# Patient Record
Sex: Female | Born: 1975 | Race: White | Hispanic: No | Marital: Single | Smoking: Never smoker
Health system: Southern US, Community
[De-identification: ages and names within clinical notes are randomized; demographics above are authoritative.]

## PROBLEM LIST (undated history)

## (undated) DIAGNOSIS — J45909 Unspecified asthma, uncomplicated: Secondary | ICD-10-CM

## (undated) DIAGNOSIS — M5020 Other cervical disc displacement, unspecified cervical region: Secondary | ICD-10-CM

## (undated) DIAGNOSIS — Z91018 Allergy to other foods: Secondary | ICD-10-CM

## (undated) DIAGNOSIS — R0602 Shortness of breath: Secondary | ICD-10-CM

## (undated) DIAGNOSIS — R600 Localized edema: Secondary | ICD-10-CM

## (undated) DIAGNOSIS — M549 Dorsalgia, unspecified: Secondary | ICD-10-CM

## (undated) DIAGNOSIS — G4730 Sleep apnea, unspecified: Secondary | ICD-10-CM

## (undated) DIAGNOSIS — G43009 Migraine without aura, not intractable, without status migrainosus: Secondary | ICD-10-CM

## (undated) DIAGNOSIS — R51 Headache: Secondary | ICD-10-CM

## (undated) DIAGNOSIS — M1990 Unspecified osteoarthritis, unspecified site: Secondary | ICD-10-CM

## (undated) DIAGNOSIS — Z9889 Other specified postprocedural states: Secondary | ICD-10-CM

## (undated) DIAGNOSIS — M722 Plantar fascial fibromatosis: Secondary | ICD-10-CM

## (undated) DIAGNOSIS — E669 Obesity, unspecified: Secondary | ICD-10-CM

## (undated) DIAGNOSIS — M2550 Pain in unspecified joint: Secondary | ICD-10-CM

## (undated) DIAGNOSIS — R112 Nausea with vomiting, unspecified: Secondary | ICD-10-CM

## (undated) DIAGNOSIS — T7840XA Allergy, unspecified, initial encounter: Secondary | ICD-10-CM

## (undated) HISTORY — DX: Shortness of breath: R06.02

## (undated) HISTORY — DX: Other cervical disc displacement, unspecified cervical region: M50.20

## (undated) HISTORY — DX: Migraine without aura, not intractable, without status migrainosus: G43.009

## (undated) HISTORY — DX: Allergy, unspecified, initial encounter: T78.40XA

## (undated) HISTORY — DX: Plantar fascial fibromatosis: M72.2

## (undated) HISTORY — PX: OTHER SURGICAL HISTORY: SHX169

## (undated) HISTORY — DX: Allergy to other foods: Z91.018

## (undated) HISTORY — PX: SHOULDER SURGERY: SHX246

## (undated) HISTORY — DX: Pain in unspecified joint: M25.50

## (undated) HISTORY — PX: TOE SURGERY: SHX1073

## (undated) HISTORY — PX: KNEE ARTHROSCOPY: SHX127

## (undated) HISTORY — DX: Unspecified asthma, uncomplicated: J45.909

## (undated) HISTORY — DX: Unspecified osteoarthritis, unspecified site: M19.90

## (undated) HISTORY — DX: Dorsalgia, unspecified: M54.9

## (undated) HISTORY — DX: Obesity, unspecified: E66.9

## (undated) HISTORY — DX: Localized edema: R60.0

---

## 1997-12-21 ENCOUNTER — Emergency Department (HOSPITAL_COMMUNITY): Admission: EM | Admit: 1997-12-21 | Discharge: 1997-12-21 | Payer: Self-pay | Admitting: Emergency Medicine

## 1997-12-21 ENCOUNTER — Encounter: Payer: Self-pay | Admitting: Emergency Medicine

## 1999-06-22 ENCOUNTER — Other Ambulatory Visit: Admission: RE | Admit: 1999-06-22 | Discharge: 1999-06-22 | Payer: Self-pay | Admitting: Family Medicine

## 2001-03-20 ENCOUNTER — Emergency Department (HOSPITAL_COMMUNITY): Admission: EM | Admit: 2001-03-20 | Discharge: 2001-03-20 | Payer: Self-pay | Admitting: Emergency Medicine

## 2001-03-25 ENCOUNTER — Emergency Department (HOSPITAL_COMMUNITY): Admission: EM | Admit: 2001-03-25 | Discharge: 2001-03-25 | Payer: Self-pay | Admitting: Emergency Medicine

## 2001-03-25 ENCOUNTER — Encounter: Payer: Self-pay | Admitting: Emergency Medicine

## 2001-06-25 ENCOUNTER — Other Ambulatory Visit: Admission: RE | Admit: 2001-06-25 | Discharge: 2001-06-25 | Payer: Self-pay | Admitting: Obstetrics and Gynecology

## 2001-07-17 ENCOUNTER — Ambulatory Visit (HOSPITAL_COMMUNITY): Admission: AD | Admit: 2001-07-17 | Discharge: 2001-07-17 | Payer: Self-pay | Admitting: Obstetrics and Gynecology

## 2001-07-17 ENCOUNTER — Encounter (INDEPENDENT_AMBULATORY_CARE_PROVIDER_SITE_OTHER): Payer: Self-pay | Admitting: Specialist

## 2002-02-10 ENCOUNTER — Encounter: Payer: Self-pay | Admitting: Emergency Medicine

## 2002-02-10 ENCOUNTER — Emergency Department (HOSPITAL_COMMUNITY): Admission: EM | Admit: 2002-02-10 | Discharge: 2002-02-10 | Payer: Self-pay | Admitting: Emergency Medicine

## 2002-04-04 HISTORY — PX: TUBAL LIGATION: SHX77

## 2002-06-11 ENCOUNTER — Emergency Department (HOSPITAL_COMMUNITY): Admission: EM | Admit: 2002-06-11 | Discharge: 2002-06-11 | Payer: Self-pay | Admitting: *Deleted

## 2002-06-11 ENCOUNTER — Encounter: Payer: Self-pay | Admitting: *Deleted

## 2002-08-15 ENCOUNTER — Other Ambulatory Visit: Admission: RE | Admit: 2002-08-15 | Discharge: 2002-08-15 | Payer: Self-pay | Admitting: Obstetrics and Gynecology

## 2002-09-26 ENCOUNTER — Ambulatory Visit (HOSPITAL_COMMUNITY): Admission: RE | Admit: 2002-09-26 | Discharge: 2002-09-26 | Payer: Self-pay | Admitting: Obstetrics and Gynecology

## 2002-09-26 ENCOUNTER — Encounter: Payer: Self-pay | Admitting: Obstetrics and Gynecology

## 2002-12-02 ENCOUNTER — Inpatient Hospital Stay (HOSPITAL_COMMUNITY): Admission: AD | Admit: 2002-12-02 | Discharge: 2002-12-02 | Payer: Self-pay | Admitting: Obstetrics and Gynecology

## 2003-02-16 ENCOUNTER — Inpatient Hospital Stay (HOSPITAL_COMMUNITY): Admission: AD | Admit: 2003-02-16 | Discharge: 2003-02-18 | Payer: Self-pay | Admitting: Obstetrics and Gynecology

## 2003-02-17 ENCOUNTER — Encounter (INDEPENDENT_AMBULATORY_CARE_PROVIDER_SITE_OTHER): Payer: Self-pay | Admitting: *Deleted

## 2005-04-08 ENCOUNTER — Emergency Department (HOSPITAL_COMMUNITY): Admission: EM | Admit: 2005-04-08 | Discharge: 2005-04-08 | Payer: Self-pay | Admitting: Family Medicine

## 2005-05-04 ENCOUNTER — Emergency Department (HOSPITAL_COMMUNITY): Admission: EM | Admit: 2005-05-04 | Discharge: 2005-05-04 | Payer: Self-pay | Admitting: Family Medicine

## 2005-05-24 ENCOUNTER — Emergency Department (HOSPITAL_COMMUNITY): Admission: EM | Admit: 2005-05-24 | Discharge: 2005-05-24 | Payer: Self-pay | Admitting: Family Medicine

## 2005-09-20 ENCOUNTER — Emergency Department (HOSPITAL_COMMUNITY): Admission: EM | Admit: 2005-09-20 | Discharge: 2005-09-20 | Payer: Self-pay | Admitting: Family Medicine

## 2005-12-05 ENCOUNTER — Emergency Department (HOSPITAL_COMMUNITY): Admission: EM | Admit: 2005-12-05 | Discharge: 2005-12-05 | Payer: Self-pay | Admitting: Family Medicine

## 2006-06-27 ENCOUNTER — Emergency Department (HOSPITAL_COMMUNITY): Admission: EM | Admit: 2006-06-27 | Discharge: 2006-06-27 | Payer: Self-pay | Admitting: Family Medicine

## 2006-08-18 ENCOUNTER — Emergency Department (HOSPITAL_COMMUNITY): Admission: EM | Admit: 2006-08-18 | Discharge: 2006-08-18 | Payer: Self-pay | Admitting: Emergency Medicine

## 2006-08-18 ENCOUNTER — Ambulatory Visit (HOSPITAL_COMMUNITY): Admission: EM | Admit: 2006-08-18 | Discharge: 2006-08-18 | Payer: Self-pay | Admitting: Emergency Medicine

## 2006-12-14 ENCOUNTER — Emergency Department (HOSPITAL_COMMUNITY): Admission: EM | Admit: 2006-12-14 | Discharge: 2006-12-14 | Payer: Self-pay | Admitting: Emergency Medicine

## 2007-04-06 ENCOUNTER — Encounter: Admission: RE | Admit: 2007-04-06 | Discharge: 2007-04-06 | Payer: Self-pay | Admitting: Orthopedic Surgery

## 2008-01-14 ENCOUNTER — Encounter: Admission: RE | Admit: 2008-01-14 | Discharge: 2008-01-14 | Payer: Self-pay | Admitting: Orthopedic Surgery

## 2009-01-28 ENCOUNTER — Emergency Department (HOSPITAL_COMMUNITY): Admission: EM | Admit: 2009-01-28 | Discharge: 2009-01-28 | Payer: Self-pay | Admitting: Family Medicine

## 2010-07-08 LAB — POCT URINALYSIS DIP (DEVICE)
Hgb urine dipstick: NEGATIVE
Protein, ur: 30 mg/dL — AB
Specific Gravity, Urine: 1.025 (ref 1.005–1.030)
Urobilinogen, UA: 0.2 mg/dL (ref 0.0–1.0)
pH: 6 (ref 5.0–8.0)

## 2010-08-20 NOTE — Discharge Summary (Signed)
[name] [name], [name] [name]                   ACCOUNT NO.:  [id number]   MEDICAL RECORD NO.:  [id number]                   PATIENT TYPE:  INP   LOCATION:  9131                                 FACILITY:  [facility]   PHYSICIAN:  Zenaida Niece, M.D.             DATE OF BIRTH:  Aug 11, 1975   DATE OF ADMISSION:  02/16/2003  DATE OF DISCHARGE:  02/18/2003                                 DISCHARGE SUMMARY   ADMITTING DIAGNOSES:  1. Intrauterine pregnancy at 38 weeks.  2. Group B streptococcus carrier.  3. Desires surgical sterility.   DISCHARGE DIAGNOSES:  1. Intrauterine pregnancy at 38 weeks.  2. Group B streptococcus carrier.  3. Desires surgical sterility.   PROCEDURES:  On February 16, 2003 she had a spontaneous vaginal delivery and  on February 17, 2003 she had a postpartum tubal ligation.   HISTORY AND PHYSICAL:  Please see chart for full history and physical.  Briefly this is a 36 year old white female gravida 5, para 1-0-3-1 with an  EGA of 38-plus weeks who presented with a complaint of regular contractions  with some change on evaluation in triage.  Prenatal labs significant for a  blood type of A negative with a negative antibody screen, rubella immune, 1-  hour glucola was 103, and group B strep was positive.  Past history  significant for one elective termination, one spontaneous abortion, one  fetal demise at 14 weeks, and one vaginal delivery at term 6 pounds 13  ounces.  Allergies to PENICILLIN.  Physical examination:  She is a afebrile  with stable vital signs.  Weight is approximately 247 pounds.  Fetal heart  tracing reactive.  Abdomen obese, gravid, estimated fetal weight 7 pounds.  Vaginal exam on my first exam she was 6, 90, and 0 with vertex presentation  and amniotomy revealed clear fluid.   HOSPITAL COURSE:  Patient was admitted and continued to contract on her own.  On my first exam I performed amniotomy for augmentation.  She was put on  vancomycin for group  B strep prophylaxis due to a PENICILLIN allergy and the  fact that sensitivities were not done on her positive group B strep.  She  progressed to complete, pushed well, and on the afternoon of February 16, 2003 had a vaginal delivery of a viable female infant with Apgars of 8 and 9  that weighed 7 pounds 6 ounces.  Placenta delivered spontaneous and was  intact.  She had a small first degree laceration which was hemostatic and  not repaired.  Estimated blood loss 450 mL.  Postpartum she did well without  complications.  Her delivery hemoglobin 12.4, post delivery 11.1.  On  postpartum day #1 she underwent tubal ligation under her epidural anesthetic  without complications.  On postpartum day #2 she was stable for discharge  home.   DISCHARGE INSTRUCTIONS:  1. Regular diet.  2. Pelvic rest.  3. Follow up in 6 weeks.  4.  Medications are Percocet (#20) one to two p.o. q.4-6h. p.r.n. pain and     over-the-counter ibuprofen as needed.  5. She is given our discharge pamphlet.                                               Zenaida Niece, M.D.    TDM/MEDQ  D:  02/18/2003  T:  02/18/2003  Job:  [id number]

## 2010-08-20 NOTE — Op Note (Signed)
   NAME:  [name] [name], [name] [name]                   ACCOUNT NO.:  [id number]   MEDICAL RECORD NO.:  [id number]                   PATIENT TYPE:  INP   LOCATION:  9131                                 FACILITY:  [facility]   PHYSICIAN:  Zenaida Niece, M.D.             DATE OF BIRTH:  [birth year]   DATE OF PROCEDURE:  02/17/2003  DATE OF DISCHARGE:                                 OPERATIVE REPORT   PREOPERATIVE DIAGNOSES:  1. Desires surgical sterility.  2. Postpartum.   POSTOPERATIVE DIAGNOSES:  1. Desires surgical sterility.  2. Postpartum.   PROCEDURE:  Bilateral partial salpingectomy.   SURGEON:  Zenaida Niece, M.D.   ANESTHESIA:  Epidural.   ESTIMATED BLOOD LOSS:  Less than 50 mL.   FINDINGS:  The patient had normal anatomy.   PROCEDURE IN DETAIL:  The patient was taken to the operating room and placed  in the dorsal supine position.  Her previously-placed epidural was dosed  appropriately.  Abdomen was prepped and draped in the usual sterile fashion.  The level of her anesthesia was found to be adequate and her infraumbilical  skin was infiltrated with 0.25% Marcaine.  A 3 cm horizontal incision was  made and carried down to the fascia.  The fascia was incised sharply and  peritoneal cavity entered bluntly.  Both fallopian tubes were identified and  traced to their fimbriated ends.  A knuckle of tube on each side was ligated  with 0 plain gut suture.  The knuckle of tube was then removed sharply.  On  both sides, both ostia were identified and the stumps were hemostatic.  Fascia was closed with running 0 Vicryl with a reinforcing figure-of-eight  suture of 0 Vicryl.  The skin was closed with a running subcuticular suture  of 4-0 Vicryl followed by a Band-Aid.  The patient tolerated the procedure  well and was taken to the recovery room in stable condition.  Counts were  correct, and she received no antibiotics.                                               Zenaida Niece, M.D.    TDM/MEDQ  D:  02/17/2003  T:  02/17/2003  Job:  [id number]

## 2010-08-20 NOTE — Op Note (Signed)
[facility]  Patient:    [name] [name], [name] [name] Visit Number: [id number] MRN: [id number]          Service Type: OBS Location: [facility] Attending Physician:  Oliver Pila Dictated by:   Alvino Chapel, M.D. Proc. Date: 07/18/01 Admit Date:  07/17/2001                             Operative Report  PREOPERATIVE DIAGNOSIS:       Inevitable abortion at 14-5/7 weeks with                               ruptured membranes.  POSTOPERATIVE DIAGNOSIS:      Inevitable abortion at 14-5/7 weeks with                               ruptured membranes.  OPERATION:                    Dilation and evacuation.  SURGEON:                      Alvino Chapel, M.D.  ANESTHESIA:                   Spinal.  FLUIDS:                       Approximately 1500 cc LR.  URINE OUTPUT:                 Straight catheterization, about 50 cc prior to                               procedure.  ESTIMATED BLOOD LOSS:         550 cc.  INDICATIONS:                  The patient is a 35 year old G4, P1-0-2-1, who was admitted at 14+ weeks with a complaint of rupture of membranes approximately two hours prior to admission followed by bleeding and cramping. Prenatal care up until this point had been uneventful.  On admission the patient was found to have cervix approximately 1.5 cm dilated with membranes protruding through the os.  Uterus was about 14 weeks size.  The patient was counseled regarding the inevitable miscarriage and given her bleeding was counseled it would be best to proceed with a dilation and evacuation.  She understood her risks and agreed to proceed with the procedure.  FINDINGS:                     There were fetal parts which were consistent with 14+ weeks gestation.  These were sent to genetics for evaluation given her second miscarriage and the second trimester loss and also to pathology. the uterus was approximately 15 weeks prior to procedure and  approximately 12 weeks post procedure.  DESCRIPTION OF PROCEDURE:     The patient was taken to the operating room, where spinal anesthesia was obtained.  She was then prepped and draped in the dorsal lithotomy position in the normal sterile fashion.  A speculum was placed in the vagina and the anterior lip of the cervix grasped with a ring forceps and a  size 14 suction curette easily passed into the uterine cavity. With suction applied in several passes, a large amount of tissue was obtained. Intermittently there were also removed both fetal extremities, the fetal spinal column, and the fetal head.  These were all noted and accounted for at the end of the procedure.  A large amount of placental tissue was also removed.  Multiple passes of the suction were necessary for a complete evacuation of the uterus.  A sharp curettage and forceps were used in the uterine cavity to ascertain if any further tissue was present.  There were no large pieces of tissue felt with either method, and the last two passes of suction yielded no further tissue.  At the conclusion of the procedure the patient had normal minimal bleeding.  Her uterus was noted to be approximately 12 weeks size, down from 15 weeks size.  However, given the cavity had not completely closed, she was placed on p.o. methergine for approximately 24 hours and given bleeding precautions.  The patient also received Clindamycin x1 dose given the multiple passes of suction curettage and was given RhoGAM in the recovery room given her Rh negative status. Dictated by:   Alvino Chapel, M.D. Attending Physician:  Oliver Pila DD:  07/18/01 TD:  07/18/01 Job: [id number]

## 2011-04-05 HISTORY — PX: FOOT NEUROMA SURGERY: SHX646

## 2011-05-14 ENCOUNTER — Ambulatory Visit (INDEPENDENT_AMBULATORY_CARE_PROVIDER_SITE_OTHER): Payer: BC Managed Care – PPO | Admitting: Physician Assistant

## 2011-05-14 VITALS — BP 122/85 | HR 80 | Temp 99.0°F | Resp 18 | Ht 60.25 in | Wt 265.8 lb

## 2011-05-14 DIAGNOSIS — J45909 Unspecified asthma, uncomplicated: Secondary | ICD-10-CM | POA: Insufficient documentation

## 2011-05-14 DIAGNOSIS — J45901 Unspecified asthma with (acute) exacerbation: Secondary | ICD-10-CM

## 2011-05-14 MED ORDER — ALBUTEROL SULFATE (2.5 MG/3ML) 0.083% IN NEBU
2.5000 mg | INHALATION_SOLUTION | Freq: Once | RESPIRATORY_TRACT | Status: AC
  Administered 2011-05-14: 2.5 mg via RESPIRATORY_TRACT

## 2011-05-14 MED ORDER — ALBUTEROL SULFATE HFA 108 (90 BASE) MCG/ACT IN AERS: 2.0000 | INHALATION_SPRAY | Freq: Four times a day (QID) | RESPIRATORY_TRACT | Status: DC | PRN

## 2011-05-14 MED ORDER — MOMETASONE FUROATE 220 MCG/INH IN AEPB: 2.0000 | INHALATION_SPRAY | Freq: Every day | RESPIRATORY_TRACT | Status: DC

## 2011-05-14 MED ORDER — PREDNISONE 10 MG PO TABS: ORAL_TABLET | ORAL | Status: DC

## 2011-05-14 MED ORDER — IPRATROPIUM BROMIDE 0.02 % IN SOLN
0.5000 mg | Freq: Once | RESPIRATORY_TRACT | Status: AC
  Administered 2011-05-14: 0.5 mg via RESPIRATORY_TRACT

## 2011-05-14 NOTE — Progress Notes (Signed)
  Subjective:    Patient ID: [name] [name], female    DOB: June 20, 1975, 36 y.o.   MRN: [id number]  Asthma She complains of chest tightness, cough, difficulty breathing, shortness of breath and wheezing. There is no sputum production. This is a chronic problem. The current episode started in the past 7 days (this episode - for 5 days). The problem occurs constantly. The problem has been gradually worsening. The cough is non-productive. Associated symptoms include chest pain (chest wall - more on the R side). Pertinent negatives include no ear pain or nasal congestion. Her symptoms are aggravated by change in weather. Her symptoms are alleviated by beta-agonist (helps for about 1 hr - using it q4h). Her past medical history is significant for asthma.   Pt started asmanex 5 days ago, she is using her Proair q4h with only relief for 1 hr.  She gets this asthma flair about 2times per year when the weather starts to change.  She only takes her asmanex 2x year when she starts to have problems and typically can get control but this year with the length of time of the fluctuating temperature she cannot get control.     Review of Systems  HENT: Negative for ear pain.   Respiratory: Positive for cough, shortness of breath and wheezing. Negative for sputum production.   Cardiovascular: Positive for chest pain (chest wall - more on the R side).       Objective:   Physical Exam  Constitutional: She is oriented to person, place, and time. She appears well-developed and well-nourished.  HENT:  Head: Normocephalic and atraumatic.  Right Ear: Tympanic membrane, external ear and ear canal normal.  Left Ear: Tympanic membrane, external ear and ear canal normal.  Nose: Nose normal.  Neck: Neck supple.  Pulmonary/Chest: Breath sounds normal.       shallow breaths  Lymphadenopathy:    She has no cervical adenopathy.  Neurological: She is alert and oriented to person, place, and time.  Skin: Skin is warm  and dry.  Psychiatric: She has a normal mood and affect. Her behavior is normal. Judgment and thought content normal.    After neb - pt felt better (20%) - improved ability to take a deep breath - no feels a cough trigger with end inspiration      Assessment & Plan:   1. Asthma  albuterol (PROVENTIL) (2.5 MG/3ML) 0.083% nebulizer solution 2.5 mg, ipratropium (ATROVENT) nebulizer solution 0.5 mg, mometasone (ASMANEX) 220 MCG/INH inhaler, albuterol (PROAIR HFA) 108 (90 BASE) MCG/ACT inhaler, predniSONE (DELTASONE) 10 MG tablet     Asthma flare - D/w pt how to prevent this in the future.  D/w pt she should start her Asmanex in Jan and Aug and continue it until her normal triggers have resolved (probably 3 months each season) - this will allow her to only be on the meds for part of the year but also keep her from having uncontrolled flares.

## 2011-06-29 ENCOUNTER — Ambulatory Visit (INDEPENDENT_AMBULATORY_CARE_PROVIDER_SITE_OTHER): Payer: BC Managed Care – PPO | Admitting: Family Medicine

## 2011-06-29 VITALS — BP 114/79 | HR 78 | Temp 97.9°F | Resp 18 | Ht 60.5 in | Wt 273.4 lb

## 2011-06-29 DIAGNOSIS — N390 Urinary tract infection, site not specified: Secondary | ICD-10-CM

## 2011-06-29 DIAGNOSIS — R300 Dysuria: Secondary | ICD-10-CM

## 2011-06-29 DIAGNOSIS — R350 Frequency of micturition: Secondary | ICD-10-CM

## 2011-06-29 LAB — POCT UA - MICROSCOPIC ONLY
Casts, Ur, LPF, POC: NEGATIVE
Mucus, UA: POSITIVE
Yeast, UA: NEGATIVE

## 2011-06-29 LAB — POCT URINALYSIS DIPSTICK
Bilirubin, UA: NEGATIVE
Ketones, UA: NEGATIVE
Protein, UA: NEGATIVE
Spec Grav, UA: 1.015
pH, UA: 7

## 2011-06-29 MED ORDER — CIPROFLOXACIN HCL 250 MG PO TABS: 250.0000 mg | ORAL_TABLET | Freq: Two times a day (BID) | ORAL | Status: AC

## 2011-06-29 MED ORDER — CIPROFLOXACIN HCL 250 MG PO TABS
500.0000 mg | ORAL_TABLET | Freq: Two times a day (BID) | ORAL | Status: DC
  Administered 2011-06-29: 500 mg via ORAL

## 2011-06-29 MED ORDER — PHENAZOPYRIDINE HCL 200 MG PO TABS: 200.0000 mg | ORAL_TABLET | Freq: Three times a day (TID) | ORAL | Status: AC | PRN

## 2011-06-29 NOTE — Patient Instructions (Signed)

## 2011-06-29 NOTE — Progress Notes (Signed)
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  Much worse today.  Took cranberry pills without much relief.  Last UTI was 1 year ago  O: Results for orders placed in visit on 06/29/11  POCT URINALYSIS DIPSTICK      Component Value Range   Color, UA yellow     Clarity, UA clear     Glucose, UA neg     Bilirubin, UA neg     Ketones, UA neg     Spec Grav, UA 1.015     Blood, UA trace     pH, UA 7.0     Protein, UA neg     Urobilinogen, UA 0.2     Nitrite, UA neg     Leukocytes, UA small (1+)    POCT UA - MICROSCOPIC ONLY      Component Value Range   WBC, Ur, HPF, POC tntc     RBC, urine, microscopic 3-5     Bacteria, U Microscopic 3+     Mucus, UA pos     Epithelial cells, urine per micros 3-5     Crystals, Ur, HPF, POC neg     Casts, Ur, LPF, POC neg     Yeast, UA neg     A: UTI, uncomplicated. P:  Cipro 500 mg bid x 3 days Urine culture

## 2011-07-02 LAB — URINE CULTURE: Colony Count: >=100000

## 2011-12-10 ENCOUNTER — Emergency Department (HOSPITAL_COMMUNITY)
Admission: EM | Admit: 2011-12-10 | Discharge: 2011-12-11 | Disposition: A | Discharge status: Home / Self Care | Payer: BC Managed Care – PPO | Attending: Emergency Medicine | Admitting: Emergency Medicine

## 2011-12-10 ENCOUNTER — Encounter (HOSPITAL_COMMUNITY): Payer: Self-pay | Admitting: Emergency Medicine

## 2011-12-10 DIAGNOSIS — Z881 Allergy status to other antibiotic agents status: Secondary | ICD-10-CM | POA: Insufficient documentation

## 2011-12-10 DIAGNOSIS — Z885 Allergy status to narcotic agent status: Secondary | ICD-10-CM | POA: Insufficient documentation

## 2011-12-10 DIAGNOSIS — L0390 Cellulitis, unspecified: Secondary | ICD-10-CM

## 2011-12-10 DIAGNOSIS — L03119 Cellulitis of unspecified part of limb: Secondary | ICD-10-CM | POA: Insufficient documentation

## 2011-12-10 DIAGNOSIS — L02619 Cutaneous abscess of unspecified foot: Secondary | ICD-10-CM | POA: Insufficient documentation

## 2011-12-10 DIAGNOSIS — Z888 Allergy status to other drugs, medicaments and biological substances status: Secondary | ICD-10-CM | POA: Insufficient documentation

## 2011-12-10 NOTE — ED Notes (Signed)
Pt alert, arrives from home, c/o swelling, itching, pain to right foot, recent sx on august 28th, "Mortens Neuroma", has f/u on Thursday, resp even unlabored, skin pwd

## 2011-12-11 MED ORDER — CLINDAMYCIN HCL 150 MG PO CAPS: 150.0000 mg | ORAL_CAPSULE | Freq: Four times a day (QID) | ORAL | Status: AC

## 2011-12-11 MED ORDER — CLINDAMYCIN PHOSPHATE 600 MG/50ML IV SOLN
600.0000 mg | Freq: Once | INTRAVENOUS | Status: AC
  Administered 2011-12-11: 600 mg via INTRAVENOUS
  Filled 2011-12-11: qty 50

## 2011-12-11 NOTE — ED Notes (Signed)
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

## 2011-12-11 NOTE — ED Provider Notes (Signed)
History     CSN: [id number]  Arrival date & time 12/10/11  2304   First MD Initiated Contact with Patient 12/11/11 0144      Chief Complaint  Patient presents with  . 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   Doing well post op until yesterday developed itching and redness. Swelling tonight, some inc pain. Mod in severity, no drainage from wound. No Cp or SOB. Using a walker, no trouble walking.  History reviewed. No pertinent past medical history.  History reviewed. No pertinent past surgical history.  No family history on file.  History  Substance Use Topics  . Smoking status: Never Smoker   . Smokeless tobacco: Not on file  . Alcohol Use: Not on file    OB History    Grav Para Term Preterm Abortions TAB SAB Ect Mult Living                  Review of Systems  Constitutional: Negative for fever and chills.  HENT: Negative for neck pain and neck stiffness.   Eyes: Negative for pain.  Respiratory: Negative for shortness of breath.   Cardiovascular: Negative for chest pain.  Gastrointestinal: Negative for abdominal pain.  Genitourinary: Negative for dysuria.  Musculoskeletal: Negative for back pain.  Skin: Positive for wound.  Neurological: Negative for headaches.  All other systems reviewed and are negative.    Allergies  Other; Amoxicillin; Codeine; and Tramadol  Home Medications   Current Outpatient Rx  Name Route Sig Dispense Refill  . ACETAMINOPHEN 500 MG PO TABS Oral Take 500-1,000 mg by mouth every 6 (six) hours as needed. For pain    . IBUPROFEN 200 MG PO TABS Oral Take 400 mg by mouth every 6 (six) hours as needed. For pain    . ALBUTEROL SULFATE HFA 108 (90 BASE) MCG/ACT IN AERS Inhalation Inhale 2 puffs into the lungs every 6 (six) hours as needed for wheezing. 1 Inhaler 2    BP 149/86  Pulse 99   Temp 98 F (36.7 C) (Oral)  Resp 16  SpO2 99%  Physical Exam  Constitutional: She is oriented to person, place, and time. She appears well-developed and well-nourished.  HENT:  Head: Normocephalic and atraumatic.  Eyes: Conjunctivae and EOM are normal. Pupils are equal, round, and reactive to light.  Neck: Trachea normal. Neck supple. No thyromegaly present.  Cardiovascular: Normal rate, regular rhythm, S1 normal, S2 normal and normal pulses.     No systolic murmur is present   No diastolic murmur is present  Pulses:      Radial pulses are 2+ on the right side, and 2+ on the left side.  Pulmonary/Chest: Effort normal and breath sounds normal. She has no wheezes. She has no rhonchi. She has no rales. She exhibits no tenderness.  Abdominal: Soft. Normal appearance and bowel sounds are normal. There is no tenderness. There is no CVA tenderness and negative Murphy's sign.  Musculoskeletal:       RLE: erythema, inc warmth and mild tenderness of dorsum of foot, ink outline applied. Distal n/v intact  Neurological: She is alert and oriented to person, place, and time. She has normal strength. No cranial nerve deficit or sensory deficit. GCS eye subscore is 4. GCS verbal subscore is 5. GCS motor subscore is 6.  Skin: Skin is warm and dry. No rash noted. She is not diaphoretic.  Psychiatric: Her  speech is normal.       Cooperative and appropriate    ED Course  Procedures (including critical care time)   Cellulitis R foot   IV ABx.   Plan RX and close f/u Ortho. Strict return precautions and cellulitis precautions verbalized as understood.    MDM   VS and nursing notes reviewed. Old records reviewed. IV medications. PT declines need for any pain meds.         Sunnie Nielsen, MD 12/11/11 [phone]

## 2011-12-17 ENCOUNTER — Other Ambulatory Visit: Payer: Self-pay | Admitting: Family Medicine

## 2012-02-16 ENCOUNTER — Ambulatory Visit (INDEPENDENT_AMBULATORY_CARE_PROVIDER_SITE_OTHER): Payer: BC Managed Care – PPO | Admitting: Family Medicine

## 2012-02-16 VITALS — BP 121/85 | HR 101 | Temp 98.2°F | Resp 16 | Ht 60.38 in | Wt 281.4 lb

## 2012-02-16 DIAGNOSIS — J45909 Unspecified asthma, uncomplicated: Secondary | ICD-10-CM

## 2012-02-16 MED ORDER — MOMETASONE FUROATE 220 MCG/INH IN AEPB: 1.0000 | INHALATION_SPRAY | Freq: Two times a day (BID) | RESPIRATORY_TRACT | Status: DC

## 2012-02-16 MED ORDER — PREDNISONE 20 MG PO TABS: ORAL_TABLET | ORAL | Status: DC

## 2012-02-16 NOTE — Progress Notes (Signed)
[facility] [address] [place] [address] [phone]  Date:  02/16/2012   Name:  [name] [name]   DOB:  [birth year]   MRN:  [id number]  PCP:  No primary provider on file.    Chief Complaint: Asthma   History of Present Illness:  [name] [name] is a 36 y.o. very pleasant female patient who presents with the following:  Here today with an asthma flare.  She has noted increased symptoms for about a week.  She has noted wheezing and chest tightness.  Her albuterol does help temporarily.  No productive cough, but she is having some cough.   No fever, chills or body aches.  No ST or ear ache.    She just used albuterol- no other medications for her asthma.  She typically has symptoms just with change of seasons.    She last used her albuterol around 0900 today.   She is using mucinex DM as well PRN.  She will use asthmanex seasonally as needed - however he is out of this so she has not been able to use it.  Last seen for this problem in February of this year.  At that time she was treated with prednisone and asmanex.  This seems like a typical asthma flare to her  Patient Active Problem List  Diagnosis  . Asthma    Past Medical History  Diagnosis Date  . Allergy   . Asthma     Past Surgical History  Procedure Date  . Knee arthroscopy   . Foot neuroma surgery     History  Substance Use Topics  . Smoking status: Never Smoker   . Smokeless tobacco: Not on file  . Alcohol Use: No    Family History  Problem Relation Age of Onset  . Lung disease Father   . Pulmonary embolism Father     Allergies  Allergen Reactions  . Other Other (See Comments)    Nut allergy  . Amoxicillin Rash  . Codeine Nausea And Vomiting and Rash  . Tramadol Nausea And Vomiting and Rash    Medication list has been reviewed and updated.  Current Outpatient Prescriptions on File Prior to Visit  Medication Sig Dispense Refill  . albuterol (PROAIR  HFA) 108 (90 BASE) MCG/ACT inhaler Inhale 2 puffs into the lungs every 6 (six) hours as needed for wheezing.  1 Inhaler  2  . acetaminophen (TYLENOL) 500 MG tablet Take 500-1,000 mg by mouth every 6 (six) hours as needed. For pain      . ibuprofen (ADVIL,MOTRIN) 200 MG tablet Take 400 mg by mouth every 6 (six) hours as needed. For pain       Current Facility-Administered Medications on File Prior to Visit  Medication Dose Route Frequency Provider Last Rate Last Dose  . ciprofloxacin (CIPRO) tablet 500 mg  500 mg Oral BID Elvina Sidle, MD   500 mg at 06/29/11 1440    Review of Systems:  As per HPI- otherwise negative.   Physical Examination: Filed Vitals:   02/16/12 1426  BP: 121/85  Pulse: 101  Temp: 98.2 F (36.8 C)  Resp: 16   Filed Vitals:   02/16/12 1426  Height: 5' 0.38" (1.534 m)  Weight: 281 lb 6.4 oz (127.642 kg)   Body mass index is 54.27 kg/(m^2). Ideal Body Weight: Weight in (lb) to have BMI = 25: 129.4   GEN: WDWN, NAD, Non-toxic, A & O x 3, obese HEENT: Atraumatic, Normocephalic.  Neck supple. No masses, No LAD. Bilateral TM wnl, oropharynx normal.  PEERL,EOMI.   Ears and Nose: No external deformity. CV: RRR, No M/G/R. No JVD. No thrill. No extra heart sounds. PULM: CTA B, no crackles, rhonchi. No retractions. No resp. distress. No accessory muscle use.  Minimal expiratory wheeze ABD: S, NT, ND, +BS. No rebound. No HSM. EXTR: No c/c/e NEURO Normal gait.  PSYCH: Normally interactive. Conversant. Not depressed or anxious appearing.  Calm demeanor.    Assessment and Plan: 1. Asthma  mometasone (ASMANEX 60 METERED DOSES) 220 MCG/INH inhaler, predniSONE (DELTASONE) 20 MG tablet   Seasonal asthma flare.  She may restart her asmanex and use as needed for the next month or so. Also a short burst of prednisone.  Patient (or parent if minor) instructed to return to clinic or call if not better in 2 day(s).  Continue albuterol as needed  Meds ordered this encounter    Medications  . clobetasol (TEMOVATE) 0.05 % external solution    Sig: Apply 1 application topically 2 (two) times daily.  . mometasone (ASMANEX 60 METERED DOSES) 220 MCG/INH inhaler    Sig: Inhale 1 puff into the lungs 2 (two) times daily. Can also do just one puff a day    Dispense:  1 Inhaler    Refill:  12  . predniSONE (DELTASONE) 20 MG tablet    Sig: Take 2 pills daily for 3 days, then 1 pill daily for 3 days    Dispense:  9 tablet    Refill:  0      COPLAND,JESSICA, MD

## 2012-02-16 NOTE — Patient Instructions (Addendum)
Let me know if you are not better in the next few days.  

## 2012-02-25 ENCOUNTER — Ambulatory Visit (INDEPENDENT_AMBULATORY_CARE_PROVIDER_SITE_OTHER): Payer: BC Managed Care – PPO | Admitting: Family Medicine

## 2012-02-25 ENCOUNTER — Ambulatory Visit: Payer: BC Managed Care – PPO

## 2012-02-25 VITALS — BP 145/100 | HR 88 | Resp 18 | Wt 280.0 lb

## 2012-02-25 DIAGNOSIS — J45909 Unspecified asthma, uncomplicated: Secondary | ICD-10-CM

## 2012-02-25 DIAGNOSIS — R059 Cough, unspecified: Secondary | ICD-10-CM

## 2012-02-25 DIAGNOSIS — R05 Cough: Secondary | ICD-10-CM

## 2012-02-25 DIAGNOSIS — R062 Wheezing: Secondary | ICD-10-CM

## 2012-02-25 DIAGNOSIS — J40 Bronchitis, not specified as acute or chronic: Secondary | ICD-10-CM

## 2012-02-25 LAB — POCT CBC
Granulocyte percent: 65.1 %G (ref 37–80)
MCH, POC: 31.1 pg (ref 27–31.2)
MCV: 102.1 fL — AB (ref 80–97)
MID (cbc): 0.6 (ref 0–0.9)
MPV: 10.6 fL (ref 0–99.8)
POC LYMPH PERCENT: 29.1 %L (ref 10–50)
POC MID %: 5.8 %M (ref 0–12)
Platelet Count, POC: 330 10*3/uL (ref 142–424)
RBC: 4.54 M/uL (ref 4.04–5.48)
RDW, POC: 12.3 %

## 2012-02-25 MED ORDER — IPRATROPIUM BROMIDE 0.02 % IN SOLN
0.5000 mg | Freq: Once | RESPIRATORY_TRACT | Status: AC
  Administered 2012-02-25: 0.5 mg via RESPIRATORY_TRACT

## 2012-02-25 MED ORDER — ALBUTEROL SULFATE (2.5 MG/3ML) 0.083% IN NEBU
2.5000 mg | INHALATION_SOLUTION | Freq: Once | RESPIRATORY_TRACT | Status: AC
  Administered 2012-02-25: 2.5 mg via RESPIRATORY_TRACT

## 2012-02-25 MED ORDER — IPRATROPIUM BROMIDE HFA 17 MCG/ACT IN AERS: 2.0000 | INHALATION_SPRAY | Freq: Four times a day (QID) | RESPIRATORY_TRACT | Status: DC

## 2012-02-25 MED ORDER — AZITHROMYCIN 250 MG PO TABS: ORAL_TABLET | ORAL | Status: DC

## 2012-02-25 MED ORDER — PREDNISONE 20 MG PO TABS: ORAL_TABLET | ORAL | Status: DC

## 2012-02-25 NOTE — Progress Notes (Signed)
Subjective: 36 year old lady who's had asthma for the last 8 years. 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 She was in here last week with a asthma flare, and was treated with a six-day course of steroids. She improved, but yesterday got significantly worse again. She says this is likely she's had bronchitis in the past. She does not know she's not coughing up anything. She has not been febrile. She's been wheezing constantly coughing.  Objective: overweight white female with a constant cough. There is a slight wheeze or cough. Throat was clear. Neck supple without significant nodes. Chest has somewhat diminished sending breath sounds. End expiratory wheeze with forced expiration, but on ordinary respirations there's not much obvious wheezing. Heart regular without murmurs. Peak flow was 340, having been 350 last February  Assessment: Asthma and asthmatic bronchitis  Plan: Check CBC and chest x-ray Give nebulizer treatment and decide treatments  Results for orders placed in visit on 02/25/12  POCT CBC      Component Value Range   WBC 10.7 (*) 4.6 - 10.2 K/uL   Lymph, poc 3.1  0.6 - 3.4   POC LYMPH PERCENT 29.1  10 - 50 %L   MID (cbc) 0.6  0 - 0.9   POC MID % 5.8  0 - 12 %M   POC Granulocyte 7.0 (*) 2 - 6.9   Granulocyte percent 65.1  37 - 80 %G   RBC 4.54  4.04 - 5.48 M/uL   Hemoglobin 14.1  12.2 - 16.2 g/dL   HCT, POC 45.4  09.8 - 47.9 %   MCV 102.1 (*) 80 - 97 fL   MCH, POC 31.1  27 - 31.2 pg   MCHC 30.4 (*) 31.8 - 35.4 g/dL   RDW, POC 11.9     Platelet Count, POC 330  142 - 424 K/uL   MPV 10.6  0 - 99.8 fL   [facility] reading (PRIMARY) by  Dr. Alwyn Ren Normal chest  Plan: Treat with atrovent and steroids and abx (since wbc sl up)  Return 10-14 days for recheck.  Repeat peak flow is 400, which was improved.  Diagnoses: Asthma Asthmatic bronchitis Cough

## 2012-02-25 NOTE — Patient Instructions (Signed)
Take medications as directed.  Return in about 2 weeks for followup check. Sooner if worse.

## 2012-03-03 ENCOUNTER — Ambulatory Visit (INDEPENDENT_AMBULATORY_CARE_PROVIDER_SITE_OTHER): Payer: BC Managed Care – PPO | Admitting: Emergency Medicine

## 2012-03-03 VITALS — BP 127/82 | HR 122 | Temp 98.4°F | Resp 17 | Ht 60.5 in | Wt 277.0 lb

## 2012-03-03 DIAGNOSIS — J209 Acute bronchitis, unspecified: Secondary | ICD-10-CM

## 2012-03-03 DIAGNOSIS — J45901 Unspecified asthma with (acute) exacerbation: Secondary | ICD-10-CM

## 2012-03-03 DIAGNOSIS — J018 Other acute sinusitis: Secondary | ICD-10-CM

## 2012-03-03 MED ORDER — PSEUDOEPHEDRINE-GUAIFENESIN ER 60-600 MG PO TB12: 1.0000 | ORAL_TABLET | Freq: Two times a day (BID) | ORAL | Status: DC

## 2012-03-03 MED ORDER — LEVOFLOXACIN 500 MG PO TABS: 500.0000 mg | ORAL_TABLET | Freq: Every day | ORAL | Status: AC

## 2012-03-03 NOTE — Progress Notes (Signed)
[facility] [address] [place] [address] [phone]  Date:  03/03/2012   Name:  [name] [name]   DOB:  24-Feb-1976   MRN:  [id number]  PCP:  Tally Due, MD    Chief Complaint: Follow-up and Bronchitis   History of Present Illness:  [name] [name] is a 36 y.o. very pleasant female patient who presents with the following:  Seen a week ago with exacerbation asthma and bronchitis.  Treated with zpak and prednisone and atrovent.  Still has a purulent nasal discharge and post nasal drainage.  Cough is worse when she lays down.  Has moderate nasal congestion.  No sore throat, wheezing or shortness of breath.  Has a mucoid cough.  No nausea or vomiting.  No stool change.  Says no improvement in the way she feels overall.  Patient Active Problem List  Diagnosis  . Asthma    Past Medical History  Diagnosis Date  . Allergy   . Asthma     Past Surgical History  Procedure Date  . Knee arthroscopy   . Foot neuroma surgery     History  Substance Use Topics  . Smoking status: Never Smoker   . Smokeless tobacco: Not on file  . Alcohol Use: No    Family History  Problem Relation Age of Onset  . Lung disease Father   . Pulmonary embolism Father     Allergies  Allergen Reactions  . Other Other (See Comments)    Nut allergy  . Amoxicillin Rash  . Codeine Nausea And Vomiting and Rash  . Tramadol Nausea And Vomiting and Rash    Medication list has been reviewed and updated.  Current Outpatient Prescriptions on File Prior to Visit  Medication Sig Dispense Refill  . albuterol (PROAIR HFA) 108 (90 BASE) MCG/ACT inhaler Inhale 2 puffs into the lungs every 6 (six) hours as needed for wheezing.  1 Inhaler  2  . ibuprofen (ADVIL,MOTRIN) 200 MG tablet Take 400 mg by mouth every 6 (six) hours as needed. For pain      . ipratropium (ATROVENT HFA) 17 MCG/ACT inhaler Inhale 2 puffs into the lungs 4 (four) times daily.  1 Inhaler  2  .  mometasone (ASMANEX 60 METERED DOSES) 220 MCG/INH inhaler Inhale 1 puff into the lungs 2 (two) times daily. Can also do just one puff a day  1 Inhaler  12  . predniSONE (DELTASONE) 20 MG tablet Take 2 pills daily for 3 days, then 1 pill daily for 3 days  9 tablet  0  . predniSONE (DELTASONE) 20 MG tablet Take 3 pills daily for 2 days, then 2 pills daily for 3 days, then one pill daily for 3 days then one half pill daily for 4 days  19 tablet  0  . azithromycin (ZITHROMAX) 250 MG tablet Take 2 initiallly, then one daily for 4 days  6 tablet  0    Review of Systems:  As per HPI, otherwise negative.    Physical Examination: Filed Vitals:   03/03/12 1639  BP: 127/82  Pulse: 122  Temp: 98.4 F (36.9 C)  Resp: 17   Filed Vitals:   03/03/12 1639  Height: 5' 0.5" (1.537 m)  Weight: 277 lb (125.646 kg)   Body mass index is 53.21 kg/(m^2). Ideal Body Weight: Weight in (lb) to have BMI = 25: 129.9   GEN:  Morbidly obese, NAD, Non-toxic, A & O x 3.  No rash or  shortness of breath HEENT: Atraumatic, Normocephalic. Neck supple. No masses, No LAD. Ears and Nose: No external deformity. CV: RRR, No M/G/R. No JVD. No thrill. No extra heart sounds. PULM: CTA B, no wheezes, crackles, rhonchi. No retractions. No resp. distress. No accessory muscle use. ABD: S, NT, ND, +BS. No rebound. No HSM. EXTR: No c/c/e NEURO Normal gait.  PSYCH: Normally interactive. Conversant. Not depressed or anxious appearing.  Calm demeanor.    Assessment and Plan: Bronchitis Exacerbation asthma Sinusitis augmentin mucinex d Albuterol MDI  Carmelina Dane, MD

## 2012-03-12 NOTE — Progress Notes (Signed)
Reviewed and agree.

## 2012-04-09 ENCOUNTER — Ambulatory Visit (INDEPENDENT_AMBULATORY_CARE_PROVIDER_SITE_OTHER): Payer: BC Managed Care – PPO | Admitting: *Deleted

## 2012-04-09 DIAGNOSIS — Z23 Encounter for immunization: Secondary | ICD-10-CM

## 2012-07-20 ENCOUNTER — Ambulatory Visit (INDEPENDENT_AMBULATORY_CARE_PROVIDER_SITE_OTHER): Payer: BC Managed Care – PPO | Admitting: Physician Assistant

## 2012-07-20 VITALS — BP 144/86 | HR 101 | Temp 98.2°F | Resp 20 | Ht 60.5 in | Wt 289.0 lb

## 2012-07-20 DIAGNOSIS — N390 Urinary tract infection, site not specified: Secondary | ICD-10-CM

## 2012-07-20 DIAGNOSIS — R300 Dysuria: Secondary | ICD-10-CM

## 2012-07-20 LAB — POCT URINALYSIS DIPSTICK
Blood, UA: NEGATIVE
Glucose, UA: NEGATIVE
Nitrite, UA: NEGATIVE
Spec Grav, UA: >=1.03
Urobilinogen, UA: 0.2

## 2012-07-20 LAB — POCT UA - MICROSCOPIC ONLY
Amorphous: POSITIVE
Crystals, Ur, HPF, POC: NEGATIVE
Yeast, UA: NEGATIVE

## 2012-07-20 MED ORDER — NITROFURANTOIN MONOHYD MACRO 100 MG PO CAPS: 100.0000 mg | ORAL_CAPSULE | Freq: Two times a day (BID) | ORAL | Status: AC

## 2012-07-20 NOTE — Progress Notes (Signed)
   [address], [place] [address]   Phone [phone]  Subjective:    Patient ID: Georgeanne Nim, female    DOB: Jun 13, 1975, 37 y.o.   MRN: [id number]  HPI Pt presents to clinic with 2 day h/o urinary urgency and bladder pressure that is getting worse today.  She is having no vaginal symptoms no change in sexual partner.  She knows that she is dehydrated because she drives a city bus and is not able to go to the bathroom easily so she tries not to drink.  She has off the next 2 days.   Review of Systems  Constitutional: Negative for fever and chills.  Gastrointestinal: Positive for abdominal pain (suprapubic pressure). Negative for nausea.  Genitourinary: Positive for urgency and frequency. Negative for dysuria and hematuria.  Musculoskeletal: Negative for back pain.       Objective:   Physical Exam  Vitals reviewed. Constitutional: She is oriented to person, place, and time. She appears well-developed and well-nourished.  HENT:  Head: Normocephalic and atraumatic.  Right Ear: External ear normal.  Left Ear: External ear normal.  Eyes: Conjunctivae are normal.  Cardiovascular: Normal rate, regular rhythm and normal heart sounds.   Pulmonary/Chest: Effort normal and breath sounds normal.  Abdominal: Soft. Bowel sounds are normal. There is tenderness (suprapubic TTP). There is no CVA tenderness.  Neurological: She is alert and oriented to person, place, and time.  Skin: Skin is warm and dry.  Psychiatric: She has a normal mood and affect. Her behavior is normal. Judgment and thought content normal.   Results for orders placed in visit on 07/20/12  POCT URINALYSIS DIPSTICK      Result Value Range   Color, UA yellow     Clarity, UA cloudy     Glucose, UA neg     Bilirubin, UA large     Ketones, UA 15     Spec Grav, UA >=1.030     Blood, UA neg     pH, UA 6.0     Protein, UA trace     Urobilinogen, UA 0.2     Nitrite, UA neg     Leukocytes, UA Trace    POCT UA -  MICROSCOPIC ONLY      Result Value Range   WBC, Ur, HPF, POC 3-11     RBC, urine, microscopic 0-1     Bacteria, U Microscopic 3+     Mucus, UA large     Epithelial cells, urine per micros 0-4     Crystals, Ur, HPF, POC neg     Casts, Ur, LPF, POC neg     Yeast, UA neg     Amorphous pos            Assessment & Plan:  Dysuria - Plan: POCT urinalysis dipstick, POCT UA - Microscopic Only  UTI (urinary tract infection) - Plan: Urine culture, nitrofurantoin, macrocrystal-monohydrate, (MACROBID) 100 MG capsule.  Pt to push fluids.    She is interested in weight loss but not interested in the surgery - she would like to try the bariatric clinic and wants to make sure it is ok.  Benny Lennert PA-C 07/20/2012 6:54 PM

## 2012-07-22 LAB — URINE CULTURE: Colony Count: 15000

## 2012-09-21 ENCOUNTER — Other Ambulatory Visit: Payer: Self-pay | Admitting: Family Medicine

## 2012-09-21 ENCOUNTER — Ambulatory Visit (INDEPENDENT_AMBULATORY_CARE_PROVIDER_SITE_OTHER): Payer: BC Managed Care – PPO | Admitting: Family Medicine

## 2012-09-21 VITALS — BP 132/88 | HR 88 | Temp 98.1°F | Resp 16 | Ht 59.0 in | Wt 287.0 lb

## 2012-09-21 DIAGNOSIS — J45901 Unspecified asthma with (acute) exacerbation: Secondary | ICD-10-CM

## 2012-09-21 DIAGNOSIS — J4541 Moderate persistent asthma with (acute) exacerbation: Secondary | ICD-10-CM

## 2012-09-21 DIAGNOSIS — Z6841 Body Mass Index (BMI) 40.0 and over, adult: Secondary | ICD-10-CM | POA: Insufficient documentation

## 2012-09-21 LAB — POCT CBC
Granulocyte percent: 63 %G (ref 37–80)
MCV: 101.1 fL — AB (ref 80–97)
MID (cbc): 0.6 (ref 0–0.9)
MPV: 11.6 fL (ref 0–99.8)
POC Granulocyte: 5.3 (ref 2–6.9)
POC LYMPH PERCENT: 29.4 %L (ref 10–50)
POC MID %: 7.6 %M (ref 0–12)
Platelet Count, POC: 238 10*3/uL (ref 142–424)
RBC: 4.23 M/uL (ref 4.04–5.48)
RDW, POC: 12.7 %

## 2012-09-21 MED ORDER — ALBUTEROL SULFATE HFA 108 (90 BASE) MCG/ACT IN AERS: 2.0000 | INHALATION_SPRAY | Freq: Four times a day (QID) | RESPIRATORY_TRACT | Status: DC | PRN

## 2012-09-21 MED ORDER — PREDNISONE 20 MG PO TABS: ORAL_TABLET | ORAL | Status: DC

## 2012-09-21 NOTE — Patient Instructions (Addendum)
Use the steroid as directed.  You can continue to use the atrovent, and use the albuterol as directed.  If you are not better in the next few days please let me know- Sooner if worse.

## 2012-09-21 NOTE — Progress Notes (Signed)
[facility] [address], [place] [address] [phone]  Date:  09/21/2012   Name:  [name] [name]   DOB:  November 24, 1975   MRN:  [id number]  PCP:  Tally Due, MD    Chief Complaint: Laryngitis and Cough   History of Present Illness:  [name] [name] is a 37 y.o. very pleasant female patient who presents with the following:  Yesterday she strated to get a hoarse voice-her voice was worse today and she started to have cough.  She did have some wheezing earlier today when she was exposed to hot temperatures.   She has a history of asthma, and does tend to have sx when exposed to heat.   She uses atrovent nebs, and needs another albuterol inhaler.   She is not taking claritin right now.   This seems like a typical asthma flare to her, except for losing her voice.   No fever.   LMP was the end of May/ early June No distress or severe sx, otherwise she is generally healthy except for obesity  Patient Active Problem List   Diagnosis Date Noted  . Asthma 05/14/2011    Past Medical History  Diagnosis Date  . Allergy   . Asthma     Past Surgical History  Procedure Laterality Date  . Knee arthroscopy    . Foot neuroma surgery    . Tubal ligation      History  Substance Use Topics  . Smoking status: Never Smoker   . Smokeless tobacco: Not on file  . Alcohol Use: No    Family History  Problem Relation Age of Onset  . Lung disease Father   . Pulmonary embolism Father     Allergies  Allergen Reactions  . Other Other (See Comments)    Nut allergy  . Amoxicillin Rash  . Codeine Nausea And Vomiting and Rash  . Tramadol Nausea And Vomiting and Rash    Medication list has been reviewed and updated.  Current Outpatient Prescriptions on File Prior to Visit  Medication Sig Dispense Refill  . loratadine (CLARITIN) 10 MG tablet Take 10 mg by mouth daily.       No current facility-administered medications on file prior to visit.     Review of Systems:  As per HPI- otherwise negative.   Physical Examination: Filed Vitals:   09/21/12 1645  BP: 132/88  Pulse: 88  Temp: 98.1 F (36.7 C)  Resp: 16   Filed Vitals:   09/21/12 1645  Height: 4\' 11"  (1.499 m)  Weight: 287 lb (130.182 kg)   Body mass index is 57.94 kg/(m^2). Ideal Body Weight: Weight in (lb) to have BMI = 25: 123.5  GEN: WDWN, NAD, Non-toxic, A & O x 3, obese HEENT: Atraumatic, Normocephalic. Neck supple. No masses, No LAD.  Bilateral TM wnl, oropharynx normal.  PEERL,EOMI.   Ears and Nose: No external deformity. CV: RRR, No M/G/R. No JVD. No thrill. No extra heart sounds. PULM: CTA B at this time- no wheezes, crackles, rhonchi. No retractions. No resp. distress. No accessory muscle use. ABD: S, NT, ND, +BS. No rebound. No HSM. EXTR: No c/c/e NEURO Normal gait.  PSYCH: Normally interactive. Conversant. Not depressed or anxious appearing.  Calm demeanor.   Results for orders placed in visit on 09/21/12  POCT CBC      Result Value Range   WBC 8.4  4.6 - 10.2 K/uL   Lymph, poc 2.5  0.6 - 3.4  POC LYMPH PERCENT 29.4  10 - 50 %L   MID (cbc) 0.6  0 - 0.9   POC MID % 7.6  0 - 12 %M   POC Granulocyte 5.3  2 - 6.9   Granulocyte percent 63.0  37 - 80 %G   RBC 4.23  4.04 - 5.48 M/uL   Hemoglobin 13.2  12.2 - 16.2 g/dL   HCT, POC 16.1  09.6 - 47.9 %   MCV 101.1 (*) 80 - 97 fL   MCH, POC 31.2  27 - 31.2 pg   MCHC 30.8 (*) 31.8 - 35.4 g/dL   RDW, POC 04.5     Platelet Count, POC 238  142 - 424 K/uL   MPV 11.6  0 - 99.8 fL     Assessment and Plan: Asthma with acute exacerbation, moderate persistent - Plan: predniSONE (DELTASONE) 20 MG tablet, albuterol (PROVENTIL HFA;VENTOLIN HFA) 108 (90 BASE) MCG/ACT inhaler, POCT CBC, Comprehensive metabolic panel  ashtma exacerbation, mild.  Prednisone, albuterol HFA.  She is due labs- check CMP and CBC Continue atrovent prn.   Patient (or parent if minor) instructed to return to clinic or call if not  better in 2 day(s).   Signed Abbe Amsterdam, MD

## 2012-09-22 LAB — COMPREHENSIVE METABOLIC PANEL
ALT: 13 U/L (ref 0–35)
AST: 18 U/L (ref 0–37)
Albumin: 4.1 g/dL (ref 3.5–5.2)
Alkaline Phosphatase: 67 U/L (ref 39–117)
BUN: 11 mg/dL (ref 6–23)
Calcium: 9.5 mg/dL (ref 8.4–10.5)
Chloride: 104 mEq/L (ref 96–112)
Potassium: 3.9 mEq/L (ref 3.5–5.3)
Sodium: 138 mEq/L (ref 135–145)

## 2012-09-23 ENCOUNTER — Encounter: Payer: Self-pay | Admitting: Family Medicine

## 2012-09-25 ENCOUNTER — Encounter: Payer: Self-pay | Admitting: Family Medicine

## 2012-11-23 ENCOUNTER — Ambulatory Visit (INDEPENDENT_AMBULATORY_CARE_PROVIDER_SITE_OTHER): Payer: BC Managed Care – PPO | Admitting: Physician Assistant

## 2012-11-23 VITALS — BP 124/76 | HR 93 | Temp 98.1°F | Resp 19 | Ht 61.0 in | Wt 282.0 lb

## 2012-11-23 DIAGNOSIS — J45901 Unspecified asthma with (acute) exacerbation: Secondary | ICD-10-CM

## 2012-11-23 DIAGNOSIS — J4541 Moderate persistent asthma with (acute) exacerbation: Secondary | ICD-10-CM

## 2012-11-23 MED ORDER — MOMETASONE FUROATE 220 MCG/INH IN AEPB: 1.0000 | INHALATION_SPRAY | Freq: Every day | RESPIRATORY_TRACT | Status: DC

## 2012-11-23 MED ORDER — ALBUTEROL SULFATE (2.5 MG/3ML) 0.083% IN NEBU: 2.5000 mg | INHALATION_SOLUTION | Freq: Once | RESPIRATORY_TRACT | Status: DC

## 2012-11-23 MED ORDER — ALBUTEROL SULFATE HFA 108 (90 BASE) MCG/ACT IN AERS: 2.0000 | INHALATION_SPRAY | Freq: Four times a day (QID) | RESPIRATORY_TRACT | Status: DC | PRN

## 2012-11-23 MED ORDER — PREDNISONE 10 MG PO TABS: ORAL_TABLET | ORAL | Status: DC

## 2012-11-23 MED ORDER — IPRATROPIUM BROMIDE 0.02 % IN SOLN: 0.5000 mg | Freq: Once | RESPIRATORY_TRACT | Status: DC

## 2012-11-23 NOTE — Patient Instructions (Addendum)
Begin taking the prednisone pills today - 6 pills at once today, then 5 pills at once tomorrow morning, then 4 pills at once the next day, then 3 pills, then 2 pills, then 1 pill.  Continue using daily allergy medicine to help with any allergic triggers  Use the asmanex (mometasone) inhaler 1 puff every morning.  If you having to use your albuterol inhaler more than 2 times per week, we may need to go to 2 puffs a day on the asmanex.  Continue using albuterol inhaler as needed for wheezing, shortness of breath.  Please let us know if any symptoms are worsening or not improving

## 2012-11-23 NOTE — Progress Notes (Signed)
  Subjective:    Patient ID: [name] [name], female    DOB: 10/22/1975, 37 y.o.   MRN: [id number]  HPI   [name] [name] is a pleasant 37 yr old female here for "my asthma."  States symptoms started four days ago and have worsened.  Experiencing SOB, "feeling like crap", coughing.  Some wheezing but at the moment.  Has had asthma "for awhile now."  Feels like symptoms fluctuate with the weather.  Does have some seasonal allergies as well, has been using claritin this week for allergy symptoms.  Uses albuterol for asthma symptoms - usually less than 1 time per week, but this week has been using q4-6h.  Also has Asmanex - uses prn, this week has been using BID.  Denies other URI symptoms.  No fever.     Review of Systems  Constitutional: Negative for fever and chills.  HENT: Negative.   Respiratory: Positive for chest tightness, shortness of breath and wheezing.   Cardiovascular: Negative.   Gastrointestinal: Negative.   Musculoskeletal: Negative.   Skin: Negative.   Neurological: Negative.        Objective:   Physical Exam  Vitals reviewed. Constitutional: She is oriented to person, place, and time. She appears well-developed and well-nourished. No distress.  HENT:  Head: Normocephalic and atraumatic.  Right Ear: Tympanic membrane and ear canal normal.  Left Ear: Tympanic membrane and ear canal normal.  Mouth/Throat: Uvula is midline, oropharynx is clear and moist and mucous membranes are normal.  Eyes: Conjunctivae are normal. No scleral icterus.  Neck: Neck supple.  Cardiovascular: Normal rate, regular rhythm and normal heart sounds.   Pulmonary/Chest: Effort normal. No accessory muscle usage. No respiratory distress. She has decreased breath sounds (slightly throughout). She has no wheezes. She has no rhonchi. She has no rales.  Lymphadenopathy:    She has no cervical adenopathy.  Neurological: She is alert and oriented to person, place, and time.  Skin: Skin is warm and dry.   Psychiatric: She has a normal mood and affect. Her behavior is normal.    Albuterol + atrovent neb x 1 - subjective improvement in symptoms, better air movement throughout      Assessment & Plan:  Asthma with acute exacerbation, moderate persistent - Plan: albuterol (PROVENTIL) (2.5 MG/3ML) 0.083% nebulizer solution 2.5 mg, ipratropium (ATROVENT) nebulizer solution 0.5 mg, albuterol (PROVENTIL HFA;VENTOLIN HFA) 108 (90 BASE) MCG/ACT inhaler, mometasone (ASMANEX) 220 MCG/INH inhaler, predniSONE (DELTASONE) 10 MG tablet    [name] [name] is a 37 yr old female here with acute exacerbation of asthma.  Neb treatment x 1 in clinic with improvement in symptoms and air movement.  Will start short PO steroid taper today.  Resume daily Asmanex - will start with 1 puff QD, though may need to increase to 2 puffs daily.  Discussed importance of daily inhaled steroid for symptoms control/prevention of flares.  Albuterol prn - discussed with pt that if she is needing albuterol more than 2x per week, we may need to step up therapy.  Discussed also possibly adding Singulair for increased allergy/asthma control.  If any symptoms worsening or not improving, pt to RTC.

## 2012-11-26 ENCOUNTER — Ambulatory Visit (INDEPENDENT_AMBULATORY_CARE_PROVIDER_SITE_OTHER): Payer: BC Managed Care – PPO | Admitting: Family Medicine

## 2012-11-26 VITALS — BP 130/90 | HR 100 | Temp 98.0°F | Resp 18 | Ht 59.0 in | Wt 283.6 lb

## 2012-11-26 DIAGNOSIS — J45901 Unspecified asthma with (acute) exacerbation: Secondary | ICD-10-CM

## 2012-11-26 DIAGNOSIS — J4541 Moderate persistent asthma with (acute) exacerbation: Secondary | ICD-10-CM

## 2012-11-26 MED ORDER — BENZONATATE 200 MG PO CAPS: 200.0000 mg | ORAL_CAPSULE | Freq: Three times a day (TID) | ORAL | Status: DC | PRN

## 2012-11-26 MED ORDER — AZITHROMYCIN 250 MG PO TABS: ORAL_TABLET | ORAL | Status: DC

## 2012-11-26 MED ORDER — PROMETHAZINE-DM 6.25-15 MG/5ML PO SYRP: 2.5000 mL | ORAL_SOLUTION | Freq: Four times a day (QID) | ORAL | Status: DC | PRN

## 2012-11-26 MED ORDER — MOMETASONE FUROATE 220 MCG/INH IN AEPB: 1.0000 | INHALATION_SPRAY | Freq: Two times a day (BID) | RESPIRATORY_TRACT | Status: DC

## 2012-11-26 NOTE — Patient Instructions (Addendum)
Asthma, Acute Bronchospasm Your exam shows you have asthma, or acute bronchospasm that acts like asthma. Bronchospasm means your air passages become narrowed. These conditions are due to inflammation and airway spasm that cause narrowing of the bronchial tubes in the lungs. This causes you to have wheezing and shortness of breath. POSSIBLE TRIGGERS  Animal dander from the skin, hair, or feathers of animals.  Dust mites contained in house dust.  Cockroaches.  Pollen from trees or grass.  Mold.  Cigarette or tobacco smoke.  Air pollutants such as dust, household cleaners, hair sprays, aerosol sprays, paint fumes, strong chemicals, or strong odors.  Cold air or weather changes. Cold air may cause inflammation. Winds increase molds and pollens in the air.  Strong emotions such as crying or laughing hard.  Stress.  Certain medicines such as aspirin or beta-blockers.  Sulfites in such foods and drinks as dried fruits and wine.  Infections or inflammatory conditions such as a flu, cold, or inflammation of the nasal membranes (rhinitis).  Gastroesophageal reflux disease (GERD). GERD is a condition where stomach acid backs up into your throat (esophagus).  Exercise or strenous activity. TREATMENT  Treatment is aimed at making the narrowed airways larger. Mild asthma or bronchospasm is usually controlled with inhaled medicines. Albuterol is a common medicine that you breathe in to open spastic or narrowed airways. Steroid medicine is also used to reduce the inflammation when an attack is moderate or severe. Antibiotics are only used if a bacterial infection is present.  HOME CARE INSTRUCTIONS   Rest.  Drink plenty of liquids. This helps the mucus to remain thin and easily coughed up. Do not use caffeine or alcohol.  Do not smoke. Avoid being exposed to secondhand smoke.  You play a critical role in keeping yourself in good health. Avoid exposure to things that cause you to wheeze.  Avoid exposure to things that cause you to have breathing problems. Keep your medicines up-to-date and available. Carefully follow your caregiver's treatment plan.  When pollen or pollution is bad, keep windows closed and use an air conditioner or go to places with air conditioning.  Take your medicine exactly as prescribed.  Asthma requires careful medical attention. See your caregiver for follow-up as advised. If you are more than [redacted] weeks pregnant and you were prescribed any new medicines, let your obstetrician know about the visit and how you are doing. Arrange a recheck. SEEK IMMEDIATE MEDICAL CARE IF:   You are getting worse.  You have trouble breathing. If severe, call your local emergency services 911 in [place]  You develop chest pain or discomfort.  You are throwing up or not drinking fluids.  You are coughing up yellow, green, brown, or bloody sputum.  You have a fever or persistent symptoms for more than 2 3 days.  You have a fever and your symptoms suddenly get worse.  You have trouble swallowing. MAKE SURE YOU:   Understand these instructions.  Will watch your condition.  Will get help right away if you are not doing well or get worse. Document Released: 07/06/2006 Document Revised: 03/07/2012 Document Reviewed: 03/05/2007 [employer] Patient Information 2014 [employer].  

## 2012-11-26 NOTE — Progress Notes (Signed)
Subjective:    Patient ID: [name] [name], female    DOB: 04-01-1976, 37 y.o.   MRN: [id number] Chief Complaint  Patient presents with  . Follow-up    follow up asthma from Friday    HPI  Has dry hacking cough that won't go away and chest still tight and feels much worse than prior.    Does not have neb machine at home - uses atrovent inhaler -   Takes 40mg  prednisone tonight  - lots of hot flashes.  Using albuterol inhaler every 6 hours.  Past Medical History  Diagnosis Date  . Allergy   . Asthma     Current Outpatient Prescriptions on File Prior to Visit  Medication Sig Dispense Refill  . albuterol (PROVENTIL HFA;VENTOLIN HFA) 108 (90 BASE) MCG/ACT inhaler Inhale 2 puffs into the lungs every 6 (six) hours as needed for wheezing.  1 Inhaler  6  . ipratropium (ATROVENT) 0.02 % nebulizer solution Take 500 mcg by nebulization 4 (four) times daily.      Marland Kitchen loratadine (CLARITIN) 10 MG tablet Take 10 mg by mouth daily.      . mometasone (ASMANEX) 220 MCG/INH inhaler Inhale 1 puff into the lungs daily.  1 Inhaler  6  . predniSONE (DELTASONE) 10 MG tablet Take 6 tabs at once today, then 5 tabs at once tomorrow, then 4 tabs, then 3, then 2, then 1  21 tablet  0   Current Facility-Administered Medications on File Prior to Visit  Medication Dose Route Frequency Provider Last Rate Last Dose  . albuterol (PROVENTIL) (2.5 MG/3ML) 0.083% nebulizer solution 2.5 mg  2.5 mg Nebulization Once Eleanore E Egan, PA-C      . ipratropium (ATROVENT) nebulizer solution 0.5 mg  0.5 mg Nebulization Once Godfrey Pick, PA-C           Review of Systems  Constitutional: Positive for diaphoresis, activity change and fatigue. Negative for fever and chills.  HENT: Positive for congestion. Negative for postnasal drip, rhinorrhea, sneezing and sore throat.   Respiratory: Positive for cough, chest tightness, shortness of breath and wheezing. Negative for stridor.   Cardiovascular: Negative for chest  pain.  Gastrointestinal: Negative for nausea and vomiting.  Endocrine: Positive for heat intolerance.  Skin: Negative for rash.  Hematological: Negative for adenopathy.  Psychiatric/Behavioral: Positive for sleep disturbance.      BP 130/90  Pulse 100  Temp(Src) 98 F (36.7 C) (Oral)  Resp 18  Ht 4\' 11"  (1.499 m)  Wt 283 lb 9.6 oz (128.64 kg)  BMI 57.25 kg/m2  SpO2 98%  LMP 11/13/2012 Objective:   Physical Exam  Constitutional: She is oriented to person, place, and time. She appears well-developed and well-nourished. No distress.  HENT:  Head: Normocephalic and atraumatic.  Right Ear: External ear normal.  Left Ear: External ear normal.  Nose: Nose normal.  Eyes: Conjunctivae are normal. Right eye exhibits no discharge. Left eye exhibits no discharge. No scleral icterus.  Neck: Neck supple. No thyromegaly present.  Cardiovascular: Normal rate, regular rhythm and normal heart sounds.   Pulmonary/Chest: Effort normal. No accessory muscle usage. No respiratory distress. She has no decreased breath sounds. She has wheezes in the right upper field, the right lower field, the left upper field and the left lower field. She has rhonchi in the right lower field and the left lower field. She has no rales.   Talks in complete sentences and walks through clinic w/o apparent dyspnea.  Musculoskeletal: She exhibits no edema  and no tenderness.  Lymphadenopathy:    She has no cervical adenopathy.  Neurological: She is alert and oriented to person, place, and time.  Skin: Skin is warm and dry. She is not diaphoretic. No erythema.  Psychiatric: She has a normal mood and affect. Her behavior is normal.         peak flow 375; goal 470 Assessment & Plan:   Asthma with acute exacerbation, moderate persistent - Plan:  mometasone (ASMANEX) 220 MCG/INH inhaler  Meds ordered this encounter  Medications  . mometasone (ASMANEX) 220 MCG/INH inhaler    Sig: Inhale 1 puff into the lungs 2 (two)  times daily.    Dispense:  1 Inhaler    Refill:  6    Order Specific Question:  Supervising Provider    Answer:  Ethelda Chick [[id number]]  . promethazine-dextromethorphan (PROMETHAZINE-DM) 6.25-15 MG/5ML syrup    Sig: Take 2.5 mLs by mouth 4 (four) times daily as needed for cough.    Dispense:  120 mL    Refill:  0  . benzonatate (TESSALON) 200 MG capsule    Sig: Take 1 capsule (200 mg total) by mouth 3 (three) times daily as needed for cough.    Dispense:  30 capsule    Refill:  0  . azithromycin (ZITHROMAX) 250 MG tablet    Sig: Take 2 tabs PO x 1 dose, then 1 tab PO QD x 4 days    Dispense:  6 tablet    Refill:  0   Norberto Sorenson, MD MPH

## 2013-01-07 ENCOUNTER — Ambulatory Visit (INDEPENDENT_AMBULATORY_CARE_PROVIDER_SITE_OTHER): Payer: BC Managed Care – PPO | Admitting: Family Medicine

## 2013-01-07 VITALS — BP 122/78 | HR 92 | Temp 98.0°F | Resp 16 | Ht 61.0 in | Wt 285.0 lb

## 2013-01-07 DIAGNOSIS — J45901 Unspecified asthma with (acute) exacerbation: Secondary | ICD-10-CM

## 2013-01-07 DIAGNOSIS — R059 Cough, unspecified: Secondary | ICD-10-CM

## 2013-01-07 DIAGNOSIS — R05 Cough: Secondary | ICD-10-CM

## 2013-01-07 MED ORDER — PREDNISONE 20 MG PO TABS: ORAL_TABLET | ORAL | Status: DC

## 2013-01-07 NOTE — Progress Notes (Addendum)
HPI Comments: [name] [name] is a 37 y.o. female who presents to the Urgent Care complaining of a cough, dry without flem. She is experiencing SOB, worsened by today's wind. She has used her inhaler today with temporary relief. Previously seen for bronchitis.  She works as a Midwife. She was in a month ago and was given a Z-Pak and some Tessalon Perles which hasn't really helped. Infectious gotten worse. She is coughing to the point of vomiting.  No nausea, shortness of breath, chest pain, hemoptysis. She has no history of fever during this time.   Objective: HEENT is unremarkable patient in no acute distress but  Chest: Faint wheezes Heart: Regular about 100 beats per minute, no murmur Extremities: No edema  Assessment: Asthmatic bronchitis  Plan: Prednisone Asthma with acute exacerbation - Plan: predniSONE (DELTASONE) 20 MG tablet  Signed, Elvina Sidle, MD

## 2013-01-07 NOTE — Patient Instructions (Signed)
Asthma, Adult Asthma is a condition that affects your lungs. It is characterized by swelling and narrowing of your airways as well as increased mucus production. The narrowing comes from swelling and muscle spasms inside the airways. When this happens, breathing can be difficult and you can have coughing, wheezing, and shortness of breath. Knowing more about asthma can help you manage it better. Asthma cannot be cured, but medicines and lifestyle changes can help control it. Asthma can be a minor problem for some people but if it is not controlled it can lead to a life-threatening asthma attack. Asthma can change over time. It is important to work with your caregiver to manage your asthma symptoms. CAUSES The exact cause of asthma is unknown. Asthma is believed to be caused by inherited (genetic) and environmental exposures. Swelling and redness (inflammation) of the airways occurs in asthma. This can be triggered by allergies, viral lung infections, or irritants in the air. Allergic reactions can cause you to wheeze immediately or several hours after an exposure. Asthma triggers are different for each person. It is important to pay attention and know what triggers your asthma.  Common triggers for asthma attacks include:  Animal dander from the skin, hair, or feathers of animals.  Dust mites contained in house dust.  Cockroaches.  Pollen from trees or grass.  Mold.  Cigarette or tobacco smoke. Smoking cannot be allowed in homes of people with asthma. People with asthma should not smoke and should not be around smokers.  Air pollutants such as dust, household cleaners, hair sprays, aerosol sprays, paint fumes, strong chemicals, or strong odors.  Cold air or weather changes. Cold air may cause inflammation. Winds increase molds and pollens in the air. There is not one best climate for people with asthma.  Strong emotions such as crying or laughing hard.  Stress.  Certain medicines such as  aspirin or beta-blockers.  Sulfites in such foods and drinks as dried fruits and wine.  Infections or inflammatory conditions such as the flu, a cold, or an inflammation of the nasal membranes (rhinitis).  Gastroesophageal reflux disease (GERD). GERD is a condition where stomach acid backs up into your throat (esophagus).  Exercise or strenous activity. Proper pre-exercise medicines allow most people to participate in sports. SYMPTOMS  Feeling short of breath.  Chest tightness or pain.  Difficulty sleeping due to coughing, wheezing, or feeling short of breath.  A whistling or wheezing sound with exhalation.  Coughing or wheezing that is worse when you:  Have a virus (such as a cold or the flu).  Are suffering from allergies.  Are exposed to certain fumes or chemicals.  Exercise. Signs that your asthma is probably getting worse include:   More frequent and bothersome asthma signs and symptoms.  Increasing difficulty breathing. This can be measured by a peak flow meter, which is a simple device used to check how well your lungs are working.  An increasingly frequent need to use a quick-relief inhaler. DIAGNOSIS  The diagnosis of asthma is made by review of your medical history, a physical exam, and possibly from other tests. Lung function studies may help with the diagnosis. TREATMENT  Asthma cannot be cured. However, for the majority of adults, asthma can be controlled with treatment. Besides avoidance of triggers of your asthma, medicines are often required. There are 2 classes of medicine used for asthma treatment: controller medicines (reduce inflammation and symptoms) andreliever or rescue medicines (relieve asthma symptoms during acute attacks). You may require daily   medicines to control your asthma. The most effective long-term controller medicines for asthma are inhaled corticosteroids (blocks inflammation). Other long-term control medicines include:  Leukotriene  receptor antagonists (blocks a pathway of inflammation).  Long-acting beta2-agonists (relaxes the muscles of the airways for at least 12 hours) with an inhaled corticosteroid.  Cromolyn sodium or nedocromil (alters certain inflammatory cells' ability to release chemicals that cause inflammation).  Immunomodulators (alters the immune system to prevent asthma symptoms).  Theophylline (relaxes muscles in the airways). You may also require a short-acting beta2-agonist to relieve asthma symptoms during an acute attack. You should understand what to do during an acute attack. Inhaled medicines are effective when used properly. Read the instructions on how to use your medicines correctly and speak to your caregiver if you have questions. Follow up with your caregiver on a regular basis to make sure your asthma is well-controlled. If your asthma is not well-controlled, if you have been hospitalized for asthma, or if multiple medicines or medium to high doses of inhaled corticosteroids are needed to control your asthma, request a referral to an asthma specialist. HOME CARE INSTRUCTIONS   Take medicines as directed by your caregiver.  Control your home environment in the following ways to help prevent asthma attacks:  Change your heating and air conditioning filter at least once a month.  Place a filter or cheesecloth over your heating and air conditioning vents.  Limit the use of fireplaces and wood stoves.  Do not smoke. Do not stay in places where others are smoking.  Get rid of pests (such as roaches and mice) and their droppings.  If you see mold on a plant, throw it away.  Clean your floors and dust every week. Use unscented cleaning products. Use a vacuum cleaner with a HEPA filter if possible. If vacuuming or cleaning triggers your asthma, try to find someone else to do these chores.  Floors in your house should be wood, tile, or vinyl. Carpet can trap dander and dust.  Use  allergy-proof pillows, mattress covers, and box spring covers.  Wash bedsheets and blankets every week in hot water and dry in a dryer.  Use a blanket that is made of polyester or cotton with a tight nap.  Do not use a dust ruffle on your bed.  Clean bathrooms and kitchens with bleach and repaint with mold-resistant paint.  Wash hands frequently.  Talk to your caregiver about an action plan for managing asthma attacks. This includes the use of a peak flow meter which measures the severity of the attack and medicines that can help stop the attack. An action plan can help minimize or stop the attack without having to seek medical care.  Remain calm during an asthma attack.  Always have a plan prepared for seeking medical attention. This should include contacting your caregiver and in the case of a severe attack, calling your local emergency services (911 in [place] SEEK MEDICAL CARE IF:   You have wheezing, shortness of breath, or a cough even if taking medicine to prevent attacks.  You have thickening of sputum.  Your sputum changes from clear or white to yellow, green, gray, or bloody.  You have any problems that may be related to the medicines you are taking (such as a rash, itching, swelling, or trouble breathing).  You are using a reliever medicine more than 2 3 times per week.  Your peak flow is still at 50 79% of personal best after following your action plan for 1   hour. SEEK IMMEDIATE MEDICAL CARE IF:   You are short of breath even at rest.  You get short of breath when doing very little physical activity.  You have difficulty eating, drinking, or talking due to asthma symptoms.  You have chest pain or you feel that your heart is beating fast.  You have a bluish color to your lips or fingernails.  You are lightheaded, dizzy, or faint.  You have a fever or persistent symptoms for more than 2 3 days.  You have a fever and symptoms suddenly get worse.  You seem to be  getting worse and are unresponsive to treatment during an asthma attack.  Your peak flow is less than 50% of personal best. MAKE SURE YOU:   Understand these instructions.  Will watch your condition.  Will get help right away if you are not doing well or get worse. Document Released: 03/21/2005 Document Revised: 03/07/2012 Document Reviewed: 11/07/2007 [employer] Patient Information 2014 [employer].  

## 2013-01-22 ENCOUNTER — Ambulatory Visit: Payer: BC Managed Care – PPO

## 2013-01-22 ENCOUNTER — Ambulatory Visit (INDEPENDENT_AMBULATORY_CARE_PROVIDER_SITE_OTHER): Payer: BC Managed Care – PPO | Admitting: Family Medicine

## 2013-01-22 VITALS — BP 122/74 | HR 99 | Temp 98.4°F | Resp 17 | Ht 62.0 in | Wt 285.0 lb

## 2013-01-22 DIAGNOSIS — R059 Cough, unspecified: Secondary | ICD-10-CM

## 2013-01-22 DIAGNOSIS — J45901 Unspecified asthma with (acute) exacerbation: Secondary | ICD-10-CM

## 2013-01-22 DIAGNOSIS — J309 Allergic rhinitis, unspecified: Secondary | ICD-10-CM

## 2013-01-22 DIAGNOSIS — R05 Cough: Secondary | ICD-10-CM

## 2013-01-22 DIAGNOSIS — J4531 Mild persistent asthma with (acute) exacerbation: Secondary | ICD-10-CM

## 2013-01-22 DIAGNOSIS — R0602 Shortness of breath: Secondary | ICD-10-CM

## 2013-01-22 DIAGNOSIS — J302 Other seasonal allergic rhinitis: Secondary | ICD-10-CM

## 2013-01-22 MED ORDER — MONTELUKAST SODIUM 10 MG PO TABS: 10.0000 mg | ORAL_TABLET | Freq: Every day | ORAL | Status: DC

## 2013-01-22 MED ORDER — ALBUTEROL SULFATE (2.5 MG/3ML) 0.083% IN NEBU: 2.5000 mg | INHALATION_SOLUTION | Freq: Once | RESPIRATORY_TRACT | Status: DC

## 2013-01-22 MED ORDER — ALBUTEROL SULFATE (2.5 MG/3ML) 0.083% IN NEBU: 2.5000 mg | INHALATION_SOLUTION | Freq: Four times a day (QID) | RESPIRATORY_TRACT | Status: DC | PRN

## 2013-01-22 MED ORDER — MOMETASONE FUROATE 50 MCG/ACT NA SUSP: 2.0000 | Freq: Every day | NASAL | Status: DC

## 2013-01-22 NOTE — Patient Instructions (Signed)
Asthma, Acute Bronchospasm Your exam shows you have asthma, or acute bronchospasm that acts like asthma. Bronchospasm means your air passages become narrowed. These conditions are due to inflammation and airway spasm that cause narrowing of the bronchial tubes in the lungs. This causes you to have wheezing and shortness of breath. POSSIBLE TRIGGERS  Animal dander from the skin, hair, or feathers of animals.  Dust mites contained in house dust.  Cockroaches.  Pollen from trees or grass.  Mold.  Cigarette or tobacco smoke.  Air pollutants such as dust, household cleaners, hair sprays, aerosol sprays, paint fumes, strong chemicals, or strong odors.  Cold air or weather changes. Cold air may cause inflammation. Winds increase molds and pollens in the air.  Strong emotions such as crying or laughing hard.  Stress.  Certain medicines such as aspirin or beta-blockers.  Sulfites in such foods and drinks as dried fruits and wine.  Infections or inflammatory conditions such as a flu, cold, or inflammation of the nasal membranes (rhinitis).  Gastroesophageal reflux disease (GERD). GERD is a condition where stomach acid backs up into your throat (esophagus).  Exercise or strenous activity. TREATMENT  Treatment is aimed at making the narrowed airways larger. Mild asthma or bronchospasm is usually controlled with inhaled medicines. Albuterol is a common medicine that you breathe in to open spastic or narrowed airways. Steroid medicine is also used to reduce the inflammation when an attack is moderate or severe. Antibiotics are only used if a bacterial infection is present.  HOME CARE INSTRUCTIONS   Rest.  Drink plenty of liquids. This helps the mucus to remain thin and easily coughed up. Do not use caffeine or alcohol.  Do not smoke. Avoid being exposed to secondhand smoke.  You play a critical role in keeping yourself in good health. Avoid exposure to things that cause you to wheeze.  Avoid exposure to things that cause you to have breathing problems. Keep your medicines up-to-date and available. Carefully follow your caregiver's treatment plan.  When pollen or pollution is bad, keep windows closed and use an air conditioner or go to places with air conditioning.  Take your medicine exactly as prescribed.  Asthma requires careful medical attention. See your caregiver for follow-up as advised. If you are more than [redacted] weeks pregnant and you were prescribed any new medicines, let your obstetrician know about the visit and how you are doing. Arrange a recheck. SEEK IMMEDIATE MEDICAL CARE IF:   You are getting worse.  You have trouble breathing. If severe, call your local emergency services 911 in [place]  You develop chest pain or discomfort.  You are throwing up or not drinking fluids.  You are coughing up yellow, green, brown, or bloody sputum.  You have a fever or persistent symptoms for more than 2 3 days.  You have a fever and your symptoms suddenly get worse.  You have trouble swallowing. MAKE SURE YOU:   Understand these instructions.  Will watch your condition.  Will get help right away if you are not doing well or get worse. Document Released: 07/06/2006 Document Revised: 03/07/2012 Document Reviewed: 03/05/2007 [employer] Patient Information 2014 [employer].  

## 2013-01-22 NOTE — Progress Notes (Signed)
[facility]:  Office Visit  Chief Complaint:  Chief Complaint  Patient presents with  . Cough  . chest tightness    HPI: [name] [name] is a 37 y.o. female who is here for her asthma sxs . She is not feeling well. She is coughing for the last 4 weeks. She was seen here on 8/22 for asthma sxs then again on 8/25 then again on 10/6. She was given nasonex inhaler, neb treatments in office, z pack and also predniosne. The prednisone helped her but now she has sxs again. She also has facial and chest congestion. Not improving. Feels she may have another attack coming and wants to make sure, these atacks are triggered by the cold air. She is a school bus driver.  She has been using her albuterol inhaler more She uses her nasonex inhaler She has a refill for prednisone that Dr Milus Glazier gave her which she has not refilled but will pick up today.  She does not like how the atrovent makes her feel  Past Medical History  Diagnosis Date  . Allergy   . Asthma    Past Surgical History  Procedure Laterality Date  . Knee arthroscopy    . Foot neuroma surgery    . Tubal ligation     History   Social History  . Marital Status: Single    Spouse Name: [name]    Number of Children: [name]  . Years of Education: [name]   Social History Main Topics  . Smoking status: Never Smoker   . Smokeless tobacco: None  . Alcohol Use: No  . Drug Use: No  . Sexual Activity: Yes    Birth Control/ Protection: Other-see comments   Other Topics Concern  . None   Social History Narrative  . None   Family History  Problem Relation Age of Onset  . Lung disease Father   . Pulmonary embolism Father    Allergies  Allergen Reactions  . Other Other (See Comments)    Nut allergy  . Amoxicillin Rash  . Codeine Nausea And Vomiting and Rash  . Tramadol Nausea And Vomiting and Rash   Prior to Admission medications   Medication Sig Start Date End Date Taking? Authorizing Provider   albuterol (PROVENTIL HFA;VENTOLIN HFA) 108 (90 BASE) MCG/ACT inhaler Inhale 2 puffs into the lungs every 6 (six) hours as needed for wheezing. 11/23/12  Yes Eleanore E Egan, PA-C  benzonatate (TESSALON) 200 MG capsule Take 1 capsule (200 mg total) by mouth 3 (three) times daily as needed for cough. 11/26/12  Yes Sherren Mocha, MD  ipratropium (ATROVENT) 0.02 % nebulizer solution Take 500 mcg by nebulization 4 (four) times daily.   Yes Historical Provider, MD  loratadine (CLARITIN) 10 MG tablet Take 10 mg by mouth daily.   Yes Historical Provider, MD  mometasone (ASMANEX) 220 MCG/INH inhaler Inhale 1 puff into the lungs 2 (two) times daily. 11/26/12  Yes Sherren Mocha, MD  predniSONE (DELTASONE) 20 MG tablet 2 daily with food 01/07/13  Yes Elvina Sidle, MD     ROS: The patient denies fevers, chills, night sweats, unintentional weight loss, chest pain, palpitations, nausea, vomiting, abdominal pain, dysuria, hematuria, melena, numbness, weakness, or tingling.   All other systems have been reviewed and were otherwise negative with the exception of those mentioned in the HPI and as above.    PHYSICAL EXAM: Filed Vitals:   01/22/13 1957  BP: 122/74  Pulse: 99  Temp: 98.4  F (36.9 C)  Resp: 17   Filed Vitals:   01/22/13 1957  Height: 5\' 2"  (1.575 m)  Weight: 285 lb (129.275 kg)   Body mass index is 52.11 kg/(m^2).  General: Alert, no acute distress HEENT:  Normocephalic, atraumatic, oropharynx patent. EOMI, PERRLA Cardiovascular:  Regular rate and rhythm, no rubs murmurs or gallops.  No Carotid bruits, radial pulse intact. No pedal edema.  Respiratory: Clear to auscultation bilaterally.  No wheezes, rales, or rhonchi.  No cyanosis, no use of accessory musculature. BS are normal, she just feels tight.  GI: No organomegaly, abdomen is soft and non-tender, positive bowel sounds.  No masses. Skin: No rashes. Neurologic: Facial musculature symmetric. Psychiatric: Patient is appropriate throughout  our interaction. Lymphatic: No cervical lymphadenopathy Musculoskeletal: Gait intact.   LABS: Results for orders placed in visit on 09/21/12  COMPREHENSIVE METABOLIC PANEL      Result Value Range   Sodium 138  135 - 145 mEq/L   Potassium 3.9  3.5 - 5.3 mEq/L   Chloride 104  96 - 112 mEq/L   CO2 23  19 - 32 mEq/L   Glucose, Bld 95  70 - 99 mg/dL   BUN 11  6 - 23 mg/dL   Creat 1.61  0.96 - 0.45 mg/dL   Total Bilirubin 0.3  0.3 - 1.2 mg/dL   Alkaline Phosphatase 67  39 - 117 U/L   AST 18  0 - 37 U/L   ALT 13  0 - 35 U/L   Total Protein 6.9  6.0 - 8.3 g/dL   Albumin 4.1  3.5 - 5.2 g/dL   Calcium 9.5  8.4 - 40.9 mg/dL  POCT CBC      Result Value Range   WBC 8.4  4.6 - 10.2 K/uL   Lymph, poc 2.5  0.6 - 3.4   POC LYMPH PERCENT 29.4  10 - 50 %L   MID (cbc) 0.6  0 - 0.9   POC MID % 7.6  0 - 12 %M   POC Granulocyte 5.3  2 - 6.9   Granulocyte percent 63.0  37 - 80 %G   RBC 4.23  4.04 - 5.48 M/uL   Hemoglobin 13.2  12.2 - 16.2 g/dL   HCT, POC 81.1  91.4 - 47.9 %   MCV 101.1 (*) 80 - 97 fL   MCH, POC 31.2  27 - 31.2 pg   MCHC 30.8 (*) 31.8 - 35.4 g/dL   RDW, POC 78.2     Platelet Count, POC 238  142 - 424 K/uL   MPV 11.6  0 - 99.8 fL     EKG/XRAY:   Primary read interpreted by Dr. Conley Rolls at [facility]. Increase vascular markings No appreciable infiltrate or pneumothrorax   ASSESSMENT/PLAN: Encounter Diagnoses  Name Primary?  . Cough Yes  . SOB (shortness of breath)   . Asthma with acute exacerbation   . Seasonal allergies    Predicted peak flow for sex, age and height should be about 435 Prepeak 380 Postpeak 400 Felt better after albuterol neb treatment, she does not like atrovent.. Lung sounds were normal to begin with but she feels less contricted and there is more air flow when she takes deep breaths on exam Rx Nasonex nasal spray and aslo singulair 10 mg daily Continue with Claritin, Nasonex inhaler, albuterol inhaler prn when out and about, also with prednisone that she  has not filled yet Since she is getting these asthma exacerbations that have been persistent with  weather changes ie cold air, hot air I will go ahead and rx her a nebulizer machine with albuterol neb meds to use at home prn.  Hand written rx and also albuterol neb solution given to patient, she can get it at [facility] or another DME store.  Declines hycodan since codeine makes her sick, refilled tessalon perles and also promethazine DM.  Go to ER prn or F/u prn Gross sideeffects, risk and benefits, and alternatives of medications d/w patient. Patient is aware that all medications have potential sideeffects and we are unable to predict every sideeffect or drug-drug interaction that may occur.  Rockne Coons, DO 01/23/2013 12:16 PM   01/23/2013 @ 12:16 Called patient to see how she is doing. LM.

## 2013-01-23 MED ORDER — BENZONATATE 200 MG PO CAPS: 200.0000 mg | ORAL_CAPSULE | Freq: Three times a day (TID) | ORAL | Status: DC | PRN

## 2013-01-23 MED ORDER — PROMETHAZINE-DM 6.25-15 MG/5ML PO SYRP: 2.5000 mL | ORAL_SOLUTION | Freq: Four times a day (QID) | ORAL | Status: DC | PRN

## 2013-03-05 ENCOUNTER — Encounter: Payer: Self-pay | Admitting: Family Medicine

## 2013-03-10 ENCOUNTER — Ambulatory Visit (INDEPENDENT_AMBULATORY_CARE_PROVIDER_SITE_OTHER): Payer: Federal, State, Local not specified - PPO | Admitting: Family Medicine

## 2013-03-10 VITALS — BP 114/82 | HR 80 | Temp 98.2°F | Resp 16 | Ht 60.25 in | Wt 283.0 lb

## 2013-03-10 DIAGNOSIS — R059 Cough, unspecified: Secondary | ICD-10-CM

## 2013-03-10 DIAGNOSIS — R05 Cough: Secondary | ICD-10-CM

## 2013-03-10 DIAGNOSIS — J45909 Unspecified asthma, uncomplicated: Secondary | ICD-10-CM

## 2013-03-10 MED ORDER — BENZONATATE 200 MG PO CAPS: 200.0000 mg | ORAL_CAPSULE | Freq: Three times a day (TID) | ORAL | Status: DC | PRN

## 2013-03-10 MED ORDER — AZITHROMYCIN 250 MG PO TABS: ORAL_TABLET | ORAL | Status: DC

## 2013-03-10 MED ORDER — PROMETHAZINE-DM 6.25-15 MG/5ML PO SYRP: 2.5000 mL | ORAL_SOLUTION | Freq: Four times a day (QID) | ORAL | Status: DC | PRN

## 2013-03-10 MED ORDER — FLUTICASONE-SALMETEROL 100-50 MCG/DOSE IN AEPB: 1.0000 | INHALATION_SPRAY | Freq: Two times a day (BID) | RESPIRATORY_TRACT | Status: DC

## 2013-03-10 NOTE — Progress Notes (Signed)
Chief Complaint:  Chief Complaint  Patient presents with  . Cough    yellowish x 2-3 wks    HPI: [name] [name] is a 37 y.o. female who is here for  Persistent cough, productive yellow sputum, no fevers/chills.  She also has allergies and asthma and she has had many recent asthmatic exacerbations. She was treated with steroids and numerous nebs treatments and inhalers.The SOB and wheezing has calmed down since the last time I saw her but she continues to have a persistent cough.  She is on singulair, she is on nasonex,  She has an albuterol inhaler.She was taking Asmanex  No fevers, chills, facial pain, ear pain.  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 She is UTD on her vaccines but does not remember last pertussis vaccine  She is planning to go see General Surgery for bariatric surgery , she would like [place] to fill out forms for her but did not bring them.  Past Medical History  Diagnosis Date  . Allergy   . Asthma    Past Surgical History  Procedure Laterality Date  . Knee arthroscopy    . Foot neuroma surgery    . Tubal ligation     History   Social History  . Marital Status: Single    Spouse Name: [name]    Number of Children: [name]  . Years of Education: [name]   Social History Main Topics  . Smoking status: Never Smoker   . Smokeless tobacco: None  . Alcohol Use: No  . Drug Use: No  . Sexual Activity: Yes    Birth Control/ Protection: Other-see comments   Other Topics Concern  . None   Social History Narrative  . None   Family History  Problem Relation Age of Onset  . Lung disease Father   . Pulmonary embolism Father    Allergies  Allergen Reactions  . Other Other (See Comments)    Nut allergy  . Amoxicillin Rash  . Codeine Nausea And Vomiting and Rash  . Tramadol Nausea And Vomiting and Rash   Prior to Admission medications   Medication Sig Start Date End Date Taking? Authorizing Provider  albuterol  (PROVENTIL HFA;VENTOLIN HFA) 108 (90 BASE) MCG/ACT inhaler Inhale 2 puffs into the lungs every 6 (six) hours as needed for wheezing. 11/23/12  Yes Eleanore E Egan, PA-C  albuterol (PROVENTIL) (2.5 MG/3ML) 0.083% nebulizer solution Take 3 mLs (2.5 mg total) by nebulization every 6 (six) hours as needed for wheezing. 01/22/13  Yes Arlene Genova P Zadiel Leyh, DO  benzonatate (TESSALON) 200 MG capsule Take 1 capsule (200 mg total) by mouth 3 (three) times daily as needed for cough. 01/23/13  Yes Gerldine Suleiman P Ibraheem Voris, DO  mometasone (ASMANEX) 220 MCG/INH inhaler Inhale 1 puff into the lungs 2 (two) times daily. 11/26/12  Yes Sherren Mocha, MD  mometasone (NASONEX) 50 MCG/ACT nasal spray Place 2 sprays into the nose daily. 01/22/13  Yes Massiah Longanecker P Cardelia Sassano, DO  montelukast (SINGULAIR) 10 MG tablet Take 1 tablet (10 mg total) by mouth at bedtime. 01/22/13  Yes Terez Montee P Keelin Neville, DO  promethazine-dextromethorphan (PROMETHAZINE-DM) 6.25-15 MG/5ML syrup Take 2.5 mLs by mouth 4 (four) times daily as needed for cough. 01/23/13  Yes Tinzley Dalia P Marialena Wollen, DO  loratadine (CLARITIN) 10 MG tablet Take 10 mg by mouth daily.    Historical Provider, MD  predniSONE (DELTASONE) 20 MG tablet 2 daily with food 01/07/13  Elvina Sidle, MD     ROS: The patient denies fevers, chills, night sweats, unintentional weight loss, chest pain, palpitations, wheezing, dyspnea on exertion, nausea, vomiting, abdominal pain, dysuria, hematuria, melena, numbness, weakness, or tingling.   All other systems have been reviewed and were otherwise negative with the exception of those mentioned in the HPI and as above.    PHYSICAL EXAM: Filed Vitals:   03/10/13 1112  BP: 114/82  Pulse: 80  Temp: 98.2 F (36.8 C)  Resp: 16   Filed Vitals:   03/10/13 1112  Height: 5' 0.25" (1.53 m)  Weight: 283 lb (128.368 kg)   Body mass index is 54.84 kg/(m^2).  General: Alert, no acute distress, morbidly obese female HEENT:  Normocephalic, atraumatic, oropharynx patent. EOMI, PERRLA, TM nl, + PND,  erythematous throat, no sinus pressure Cardiovascular:  Regular rate and rhythm, no rubs murmurs or gallops.  No Carotid bruits, radial pulse intact. No pedal edema.  Respiratory: Clear to auscultation bilaterally.  No wheezes, rales, or rhonchi.  No cyanosis, no use of accessory musculature GI: No organomegaly, abdomen is soft and non-tender, positive bowel sounds.  No masses. Skin: No rashes. Neurologic: Facial musculature symmetric. Psychiatric: Patient is appropriate throughout our interaction. Lymphatic: No cervical lymphadenopathy Musculoskeletal: Gait intact.   LABS: Results for orders placed in visit on 09/21/12  COMPREHENSIVE METABOLIC PANEL      Result Value Range   Sodium 138  135 - 145 mEq/L   Potassium 3.9  3.5 - 5.3 mEq/L   Chloride 104  96 - 112 mEq/L   CO2 23  19 - 32 mEq/L   Glucose, Bld 95  70 - 99 mg/dL   BUN 11  6 - 23 mg/dL   Creat 1.61  0.96 - 0.45 mg/dL   Total Bilirubin 0.3  0.3 - 1.2 mg/dL   Alkaline Phosphatase 67  39 - 117 U/L   AST 18  0 - 37 U/L   ALT 13  0 - 35 U/L   Total Protein 6.9  6.0 - 8.3 g/dL   Albumin 4.1  3.5 - 5.2 g/dL   Calcium 9.5  8.4 - 40.9 mg/dL  POCT CBC      Result Value Range   WBC 8.4  4.6 - 10.2 K/uL   Lymph, poc 2.5  0.6 - 3.4   POC LYMPH PERCENT 29.4  10 - 50 %L   MID (cbc) 0.6  0 - 0.9   POC MID % 7.6  0 - 12 %M   POC Granulocyte 5.3  2 - 6.9   Granulocyte percent 63.0  37 - 80 %G   RBC 4.23  4.04 - 5.48 M/uL   Hemoglobin 13.2  12.2 - 16.2 g/dL   HCT, POC 81.1  91.4 - 47.9 %   MCV 101.1 (*) 80 - 97 fL   MCH, POC 31.2  27 - 31.2 pg   MCHC 30.8 (*) 31.8 - 35.4 g/dL   RDW, POC 78.2     Platelet Count, POC 238  142 - 424 K/uL   MPV 11.6  0 - 99.8 fL     EKG/XRAY:   Primary read interpreted by Dr. Conley Rolls at [facility].   ASSESSMENT/PLAN: Encounter Diagnoses  Name Primary?  . Cough Yes  . Asthma, chronic   . Asthmatic bronchitis    Pre-Peakflow was 380, predicted for her is 375 Rx Promethazine DM cough syrup Rx  Tessalon Perles Rx Advair diskus for asthma, Trial of Advair for asthma since she  is having to use her nebs and inhaler more persistently  Rx Azithromycin for persistent cough most likely related to allergies/asthma/PND but also to cover for Pertussis  She will return with paperwork for bariatric surgery, needs OV for this F/u prn   Gross sideeffects, risk and benefits, and alternatives of medications d/w patient. Patient is aware that all medications have potential sideeffects and we are unable to predict every sideeffect or drug-drug interaction that may occur.  Keymoni Mccaster PHUONG, DO 03/10/2013 1:45 PM

## 2013-03-23 ENCOUNTER — Ambulatory Visit (INDEPENDENT_AMBULATORY_CARE_PROVIDER_SITE_OTHER): Payer: Federal, State, Local not specified - PPO | Admitting: Physician Assistant

## 2013-03-23 VITALS — BP 122/80 | HR 71 | Temp 98.0°F | Resp 18 | Ht 60.5 in | Wt 281.0 lb

## 2013-03-23 DIAGNOSIS — H6690 Otitis media, unspecified, unspecified ear: Secondary | ICD-10-CM

## 2013-03-23 DIAGNOSIS — H9202 Otalgia, left ear: Secondary | ICD-10-CM

## 2013-03-23 DIAGNOSIS — H9209 Otalgia, unspecified ear: Secondary | ICD-10-CM

## 2013-03-23 DIAGNOSIS — H6691 Otitis media, unspecified, right ear: Secondary | ICD-10-CM

## 2013-03-23 MED ORDER — AZITHROMYCIN 500 MG PO TABS: 500.0000 mg | ORAL_TABLET | Freq: Every day | ORAL | Status: DC

## 2013-03-23 NOTE — Progress Notes (Signed)
   Subjective:    Patient ID: [name] [name], female    DOB: [birth year], 37 y.o.   MRN: [id number]  Otalgia  Associated symptoms include a sore throat. Pertinent negatives include no abdominal pain, coughing, headaches or vomiting.   37 year old female presents for evaluation of left ear pain and left sided throat pain x 2 days. Was seen here on 03/10/13 and given inhaler to use and a Zpack to fill only if not improving. She did start this on 03/20/13 and felt like her symptoms were improving until yesterday when her ear and throat started to hurt.  Does have hx of ear infections and so she is worried that is what she has.  All other symptoms improving.  She is on last day of her Zpack.  Denies fever, chills, nausea, vomiting, headache, wheezing, SOB, or abdominal pain.  Patient is otherwise doing well with no other concerns today.       Review of Systems  Constitutional: Negative for fever and chills.  HENT: Positive for ear pain and sore throat. Negative for congestion, postnasal drip and sinus pressure.   Respiratory: Negative for cough, shortness of breath and wheezing.   Gastrointestinal: Negative for nausea, vomiting and abdominal pain.  Neurological: Negative for dizziness and headaches.       Objective:   Physical Exam  Constitutional: She is oriented to person, place, and time. She appears well-developed and well-nourished.  HENT:  Head: Normocephalic and atraumatic.  Right Ear: Hearing, tympanic membrane, external ear and ear canal normal.  Left Ear: Hearing, external ear and ear canal normal. A middle ear effusion (dull) is present.  Eyes: Conjunctivae are normal.  Neck: Normal range of motion. Neck supple.  Cardiovascular: Normal rate, regular rhythm and normal heart sounds.   Pulmonary/Chest: Effort normal and breath sounds normal.  Lymphadenopathy:    She has no cervical adenopathy.  Neurological: She is alert and oriented to person, place, and time.  Psychiatric:  She has a normal mood and affect. Her behavior is normal. Judgment and thought content normal.          Assessment & Plan:  Otitis media, right - Plan: azithromycin (ZITHROMAX) 500 MG tablet  Will extend rx for azithromycin 500 mg daily x 5 days Continue inhalers as prescribed.  Nasonex daily to help with ETD Return if symptoms worsen or fail to improve.

## 2013-04-11 ENCOUNTER — Ambulatory Visit (INDEPENDENT_AMBULATORY_CARE_PROVIDER_SITE_OTHER): Payer: Self-pay | Admitting: Surgery

## 2013-04-17 ENCOUNTER — Telehealth (INDEPENDENT_AMBULATORY_CARE_PROVIDER_SITE_OTHER): Payer: Self-pay

## 2013-04-17 ENCOUNTER — Ambulatory Visit (INDEPENDENT_AMBULATORY_CARE_PROVIDER_SITE_OTHER): Payer: Self-pay | Admitting: Surgery

## 2013-04-17 NOTE — Telephone Encounter (Signed)
Called and spoke to patient regarding rescheduling her appt today with Dr. Hassell Done due to delay in surgery.  Patient new appt date & time is scheduled for 04/26/13 @ 4:30pm w/Dr. Hassell Done.

## 2013-04-19 ENCOUNTER — Other Ambulatory Visit: Payer: Self-pay | Admitting: Family Medicine

## 2013-04-20 ENCOUNTER — Emergency Department (HOSPITAL_COMMUNITY)
Admission: EM | Admit: 2013-04-20 | Discharge: 2013-04-20 | Disposition: A | Discharge status: Home / Self Care | Payer: BC Managed Care – PPO | Source: Home / Self Care | Attending: Emergency Medicine | Admitting: Emergency Medicine

## 2013-04-20 ENCOUNTER — Encounter (HOSPITAL_COMMUNITY): Payer: Self-pay | Admitting: Emergency Medicine

## 2013-04-20 DIAGNOSIS — J069 Acute upper respiratory infection, unspecified: Secondary | ICD-10-CM

## 2013-04-20 MED ORDER — ALBUTEROL SULFATE HFA 108 (90 BASE) MCG/ACT IN AERS: 1.0000 | INHALATION_SPRAY | Freq: Four times a day (QID) | RESPIRATORY_TRACT | Status: DC | PRN

## 2013-04-20 MED ORDER — BENZONATATE 100 MG PO CAPS: 100.0000 mg | ORAL_CAPSULE | Freq: Three times a day (TID) | ORAL | Status: DC | PRN

## 2013-04-20 MED ORDER — IPRATROPIUM BROMIDE 0.06 % NA SOLN: 2.0000 | Freq: Four times a day (QID) | NASAL | Status: DC

## 2013-04-20 NOTE — ED Provider Notes (Signed)
CSN: [id number]     Arrival date & time 04/20/13  2440 History   First MD Initiated Contact with Patient 04/20/13 [phone]     Chief Complaint  Patient presents with  . Asthma  . Nasal Congestion   (Consider location/radiation/quality/duration/timing/severity/associated sxs/prior Treatment) HPI Comments: Non-smoker.  [facility]: [facility] Decided to come here instead because  "they have no parking available whenever I go over there."  Patient is a 38 y.o. female presenting with URI. The history is provided by the patient.  URI Presenting symptoms: congestion, cough and rhinorrhea   Presenting symptoms: no fever and no sore throat   Severity:  Moderate Onset quality:  Gradual Duration:  4 days Timing:  Constant Progression:  Unchanged Chronicity:  New Associated symptoms: wheezing   Associated symptoms: no headaches     Past Medical History  Diagnosis Date  . Allergy   . Asthma    Past Surgical History  Procedure Laterality Date  . Knee arthroscopy    . Foot neuroma surgery    . Tubal ligation     Family History  Problem Relation Age of Onset  . Lung disease Father   . Pulmonary embolism Father    History  Substance Use Topics  . Smoking status: Never Smoker   . Smokeless tobacco: Not on file  . Alcohol Use: No   OB History   Grav Para Term Preterm Abortions TAB SAB Ect Mult Living                 Review of Systems  Constitutional: Negative for fever.  HENT: Positive for congestion and rhinorrhea. Negative for sore throat.   Eyes: Negative.   Respiratory: Positive for cough and wheezing.   Cardiovascular: Negative.   Gastrointestinal: Negative.   Endocrine: Negative for polydipsia, polyphagia and polyuria.  Genitourinary: Negative.   Musculoskeletal: Negative.   Skin: Negative.   Allergic/Immunologic: Negative for immunocompromised state.  Neurological: Negative for dizziness, weakness and headaches.  Hematological: Negative for  adenopathy.    Allergies  Other; Amoxicillin; Codeine; and Tramadol  Home Medications   Current Outpatient Rx  Name  Route  Sig  Dispense  Refill  . albuterol (PROVENTIL HFA;VENTOLIN HFA) 108 (90 BASE) MCG/ACT inhaler   Inhalation   Inhale 2 puffs into the lungs every 6 (six) hours as needed for wheezing.   1 Inhaler   6   . Fluticasone-Salmeterol (ADVAIR DISKUS) 100-50 MCG/DOSE AEPB   Inhalation   Inhale 1 puff into the lungs 2 (two) times daily.   60 each   5   . montelukast (SINGULAIR) 10 MG tablet   Oral   Take 1 tablet (10 mg total) by mouth at bedtime.   30 tablet   5   . azithromycin (ZITHROMAX) 500 MG tablet   Oral   Take 1 tablet (500 mg total) by mouth daily.   5 tablet   0   . benzonatate (TESSALON) 200 MG capsule   Oral   Take 1 capsule (200 mg total) by mouth 3 (three) times daily as needed for cough.   30 capsule   1   . loratadine (CLARITIN) 10 MG tablet   Oral   Take 10 mg by mouth daily.         . mometasone (NASONEX) 50 MCG/ACT nasal spray   Nasal   Place 2 sprays into the nose daily.   17 g   12   . promethazine-dextromethorphan (PROMETHAZINE-DM) 6.25-15 MG/5ML syrup  Oral   Take 2.5 mLs by mouth 4 (four) times daily as needed for cough.   120 mL   0     May make you drowsy, do not operate heavy machinar ...    BP 142/100  Pulse 88  Temp(Src) 97.9 F (36.6 C) (Oral)  Resp 18  SpO2 97%  LMP 03/26/2013 Physical Exam  Nursing note and vitals reviewed. Constitutional: She is oriented to person, place, and time. She appears well-developed and well-nourished. No distress.  +morbidly obese  HENT:  Head: Normocephalic and atraumatic.  Right Ear: Hearing, tympanic membrane, external ear and ear canal normal.  Left Ear: Hearing, tympanic membrane, external ear and ear canal normal.  Nose: Nose normal.  Mouth/Throat: Uvula is midline, oropharynx is clear and moist and mucous membranes are normal.  Eyes: Conjunctivae are normal.  Right eye exhibits no discharge. Left eye exhibits no discharge. No scleral icterus.  Neck: Neck supple.  Cardiovascular: Normal rate, regular rhythm and normal heart sounds.   Pulmonary/Chest: Effort normal and breath sounds normal. No respiratory distress. She has no wheezes.  Abdominal: Soft. Bowel sounds are normal. There is no tenderness.  Musculoskeletal: Normal range of motion.  Lymphadenopathy:    She has no cervical adenopathy.  Neurological: She is alert and oriented to person, place, and time.  Skin: Skin is warm and dry.  Psychiatric: She has a normal mood and affect. Her behavior is normal.    ED Course  Procedures (including critical care time) Labs Review Labs Reviewed - No data to display Imaging Review No results found.  EKG Interpretation    Date/Time:    Ventricular Rate:    PR Interval:    QRS Duration:   QT Interval:    QTC Calculation:   R Axis:     Text Interpretation:              MDM  Atrovent nasal spray for congestion. Tessalon for cough. No clinical evidence of bronchospasm at today's visit, but patient may need albuterol MDI as she recovers from URI and I will provide Rx for albuterol.  Educated about symptomatic care at home and indications for follow up.    [place], [place] 04/20/13 1014

## 2013-04-20 NOTE — ED Provider Notes (Signed)
Medical screening examination/treatment/procedure(s) were performed by non-physician practitioner and as supervising physician I was immediately available for consultation/collaboration.  Philipp Deputy, M.D.  Harden Mo, MD 04/20/13 2212

## 2013-04-20 NOTE — ED Notes (Signed)
C/O cough with asthma flare-up 4 days ago.  Yesterday felt it was getting worse despite using albuterol nebs and inhaler and her normal inhalers.  Yesterday cough was constant, causing coughing fits that induced vomiting.  Feels like she needs to bring up sputum but is unable to even after nebs.  Started taking a left-over Prednisone Rx, Mucinex, Alka Seltzer Cold and another OTC med.

## 2013-04-21 ENCOUNTER — Ambulatory Visit (INDEPENDENT_AMBULATORY_CARE_PROVIDER_SITE_OTHER): Payer: Federal, State, Local not specified - PPO | Admitting: Emergency Medicine

## 2013-04-21 VITALS — BP 126/82 | HR 97 | Temp 98.0°F | Resp 18 | Ht 60.5 in | Wt 280.6 lb

## 2013-04-21 DIAGNOSIS — J111 Influenza due to unidentified influenza virus with other respiratory manifestations: Secondary | ICD-10-CM

## 2013-04-21 MED ORDER — HYDROCOD POLST-CHLORPHEN POLST 10-8 MG/5ML PO LQCR: 5.0000 mL | Freq: Two times a day (BID) | ORAL | Status: DC | PRN

## 2013-04-21 MED ORDER — OSELTAMIVIR PHOSPHATE 75 MG PO CAPS: 75.0000 mg | ORAL_CAPSULE | Freq: Two times a day (BID) | ORAL | Status: DC

## 2013-04-21 NOTE — Patient Instructions (Signed)
Oseltamivir capsules What is this medicine? OSELTAMIVIR (os el TAM i vir) is an antiviral medicine. It is used to prevent and to treat some kinds of influenza or the flu. It will not work for colds or other viral infections. This medicine may be used for other purposes; ask your health care provider or pharmacist if you have questions. COMMON BRAND NAME(S): Tamiflu What should I tell my health care provider before I take this medicine? They need to know if you have any of the following conditions: -heart disease -immune system problems -kidney disease -liver disease -lung disease -an unusual or allergic reaction to oseltamivir, other medicines, foods, dyes, or preservatives -pregnant or trying to get pregnant -breast-feeding How should I use this medicine? Take this medicine by mouth with a glass of water. Follow the directions on the prescription label. Start this medicine at the first sign of flu symptoms. You can take it with or without food. If it upsets your stomach, take it with food. Take your medicine at regular intervals. Do not take your medicine more often than directed. Take all of your medicine as directed even if you think you are better. Do not skip doses or stop your medicine early. Talk to your pediatrician regarding the use of this medicine in children. While this drug may be prescribed for children as young as 14 days for selected conditions, precautions do apply. Overdosage: If you think you have taken too much of this medicine contact a poison control center or emergency room at once. NOTE: This medicine is only for you. Do not share this medicine with others. What if I miss a dose? If you miss a dose, take it as soon as you remember. If it is almost time for your next dose (within 2 hours), take only that dose. Do not take double or extra doses. What may interact with this medicine? Interactions are not expected. This list may not describe all possible interactions. Give  your health care provider a list of all the medicines, herbs, non-prescription drugs, or dietary supplements you use. Also tell them if you smoke, drink alcohol, or use illegal drugs. Some items may interact with your medicine. What should I watch for while using this medicine? Visit your doctor or health care professional for regular check ups. Tell your doctor if your symptoms do not start to get better or if they get worse. If you have the flu, you may be at an increased risk of developing seizures, confusion, or abnormal behavior. This occurs early in the illness, and more frequently in children and teens. These events are not common, but may result in accidental injury to the patient. Families and caregivers of patients should watch for signs of unusual behavior and contact a doctor or health care professional right away if the patient shows signs of unusual behavior. This medicine is not a substitute for the flu shot. Talk to your doctor each year about an annual flu shot. What side effects may I notice from receiving this medicine? Side effects that you should report to your doctor or health care professional as soon as possible: -allergic reactions like skin rash, itching or hives, swelling of the face, lips, or tongue -anxiety, confusion, unusual behavior -breathing problems -hallucination, loss of contact with reality -redness, blistering, peeling or loosening of the skin, including inside the mouth -seizures Side effects that usually do not require medical attention (report to your doctor or health care professional if they continue or are bothersome): -cough -diarrhea -dizziness -  headache -nausea, vomiting -stomach pain This list may not describe all possible side effects. Call your doctor for medical advice about side effects. You may report side effects to FDA at 1-800-FDA-1088. Where should I keep my medicine? Keep out of the reach of children. Store at room temperature between  15 and 30 degrees C (59 and 86 degrees F). Throw away any unused medicine after the expiration date. NOTE: This sheet is a summary. It may not cover all possible information. If you have questions about this medicine, talk to your doctor, pharmacist, or health care provider.  2014, Elsevier/Gold Standard. (2011-03-25 19:43:38)

## 2013-04-21 NOTE — Progress Notes (Signed)
[facility] [address], [place] [address] [phone]  Date:  04/21/2013   Name:  [name] [name]   DOB:  1975/08/05   MRN:  [id number]  PCP:  [name], MD    Chief Complaint: Cough, Nasal Congestion and Chills   History of Present Illness:  [name] [name] is a 38 y.o. very pleasant female patient who presents with the following:  Ill since Friday with nasal congestion and a mucopurulent drainage.  Has a cough that is not productive and associated with wheezing. Not responsive to her MDI or neb machine.  Only used her MDI once today.  Went to to [facility] urgent yesterday and discharged on tessalon with no improvement  Has a fever since last night with chills.  No fever now.  No nausea or vomiting.  No stool change.  Multiple ill contacts.  No improvement with over the counter medications or other home remedies. Denies other complaint or health concern today.   Patient Active Problem List   Diagnosis Date Noted  . Morbid obesity 09/21/2012  . Asthma 05/14/2011    Past Medical History  Diagnosis Date  . Allergy   . Asthma     Past Surgical History  Procedure Laterality Date  . Knee arthroscopy    . Foot neuroma surgery    . Tubal ligation      History  Substance Use Topics  . Smoking status: Never Smoker   . Smokeless tobacco: Not on file  . Alcohol Use: No    Family History  Problem Relation Age of Onset  . Lung disease Father   . Pulmonary embolism Father     Allergies  Allergen Reactions  . Other Other (See Comments)    Nut allergy  . Amoxicillin Rash  . Codeine Nausea And Vomiting and Rash  . Tramadol Nausea And Vomiting and Rash    Medication list has been reviewed and updated.  Current Outpatient Prescriptions on File Prior to Visit  Medication Sig Dispense Refill  . albuterol (PROVENTIL HFA;VENTOLIN HFA) 108 (90 BASE) MCG/ACT inhaler Inhale 2 puffs into the lungs every 6 (six) hours as needed for  wheezing.  1 Inhaler  6  . benzonatate (TESSALON) 100 MG capsule Take 1 capsule (100 mg total) by mouth 3 (three) times daily as needed for cough. Swallow whole  21 capsule  0  . Fluticasone-Salmeterol (ADVAIR DISKUS) 100-50 MCG/DOSE AEPB Inhale 1 puff into the lungs 2 (two) times daily.  60 each  5  . mometasone (NASONEX) 50 MCG/ACT nasal spray Place 2 sprays into the nose daily.  17 g  12  . montelukast (SINGULAIR) 10 MG tablet Take 1 tablet (10 mg total) by mouth at bedtime.  30 tablet  5  . albuterol (PROVENTIL HFA;VENTOLIN HFA) 108 (90 BASE) MCG/ACT inhaler Inhale 1-2 puffs into the lungs every 6 (six) hours as needed for wheezing or shortness of breath.  1 Inhaler  0  . azithromycin (ZITHROMAX) 500 MG tablet Take 1 tablet (500 mg total) by mouth daily.  5 tablet  0  . benzonatate (TESSALON) 200 MG capsule Take 1 capsule (200 mg total) by mouth 3 (three) times daily as needed for cough.  30 capsule  1  . ipratropium (ATROVENT) 0.06 % nasal spray Place 2 sprays into both nostrils 4 (four) times daily.  15 mL  0  . loratadine (CLARITIN) 10 MG tablet Take 10 mg by mouth daily.      Marland Kitchen  promethazine-dextromethorphan (PROMETHAZINE-DM) 6.25-15 MG/5ML syrup Take 2.5 mLs by mouth 4 (four) times daily as needed for cough.  120 mL  0   Current Facility-Administered Medications on File Prior to Visit  Medication Dose Route Frequency Provider Last Rate Last Dose  . albuterol (PROVENTIL) (2.5 MG/3ML) 0.083% nebulizer solution 2.5 mg  2.5 mg Nebulization Once Thao P Le, DO        Review of Systems:  As per HPI, otherwise negative.    Physical Examination: Filed Vitals:   04/21/13 1736  BP: 126/82  Pulse: 97  Temp: 98 F (36.7 C)  Resp: 18   Filed Vitals:   04/21/13 1736  Height: 5' 0.5" (1.537 m)  Weight: 280 lb 9.6 oz (127.279 kg)   Body mass index is 53.88 kg/(m^2). Ideal Body Weight: Weight in (lb) to have BMI = 25: 129.9  GEN: morbid obesity, NAD, Non-toxic, A & O x 3  Persistent non  productive cough HEENT: Atraumatic, Normocephalic. Neck supple. No masses, No LAD. Ears and Nose: No external deformity. CV: RRR, No M/G/R. No JVD. No thrill. No extra heart sounds. PULM: CTA B, no wheezes, crackles, rhonchi. No retractions. No resp. distress. No accessory muscle use. ABD: S, NT, ND, +BS. No rebound. No HSM. EXTR: No c/c/e NEURO Normal gait.  PSYCH: Normally interactive. Conversant. Not depressed or anxious appearing.  Calm demeanor.    Assessment and Plan: Influenza tamiflu Continue MDI and neb tussionex  Signed,  Ellison Carwin, MD

## 2013-04-26 ENCOUNTER — Telehealth (INDEPENDENT_AMBULATORY_CARE_PROVIDER_SITE_OTHER): Payer: Self-pay

## 2013-04-26 ENCOUNTER — Ambulatory Visit (INDEPENDENT_AMBULATORY_CARE_PROVIDER_SITE_OTHER): Payer: BC Managed Care – PPO | Admitting: Surgery

## 2013-04-26 ENCOUNTER — Encounter (INDEPENDENT_AMBULATORY_CARE_PROVIDER_SITE_OTHER): Payer: Self-pay | Admitting: Surgery

## 2013-04-26 DIAGNOSIS — J45909 Unspecified asthma, uncomplicated: Secondary | ICD-10-CM

## 2013-04-26 DIAGNOSIS — Z6841 Body Mass Index (BMI) 40.0 and over, adult: Secondary | ICD-10-CM

## 2013-04-26 DIAGNOSIS — M129 Arthropathy, unspecified: Secondary | ICD-10-CM

## 2013-04-26 NOTE — Patient Instructions (Signed)

## 2013-04-26 NOTE — Telephone Encounter (Signed)
Left message for pt to call.  I want her to come on in and be ready to be seen by 4

## 2013-04-26 NOTE — Progress Notes (Signed)
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  We explored her history and she does have problems with asthma in his recently had to take steroids for this. In addition she has arthritic pains and takes nonsteroidal anti-inflammatories. Those 2 historical facts led [place] to gravitate more toward her having a sleeve gastrectomy.  Her only prior abdominal surgery was a tubal ligation. She's otherwise been healthy.  Past Medical History  Diagnosis Date  . Allergy   . Asthma   . Arthritis     Past Surgical History  Procedure Laterality Date  . Knee arthroscopy    . Foot neuroma surgery    . Tubal ligation      Current Outpatient Prescriptions  Medication Sig Dispense Refill  . albuterol (PROVENTIL HFA;VENTOLIN HFA) 108 (90 BASE) MCG/ACT inhaler Inhale 1-2 puffs into the lungs every 6 (six) hours as needed for wheezing or shortness of breath.  1 Inhaler  0  . Antiseborrheic Products, Misc. (PROMISEB) CREA       . ASMANEX 60 METERED DOSES 220 MCG/INH inhaler       . benzonatate (TESSALON) 100 MG capsule Take 1 capsule (100 mg total) by mouth 3 (three) times daily as needed for cough. Swallow whole  21 capsule  0  . chlorpheniramine-HYDROcodone (TUSSIONEX PENNKINETIC ER) 10-8 MG/5ML LQCR Take 5 mLs by mouth every 12 (twelve) hours as needed.  60 mL  0  . clobetasol (TEMOVATE) 0.05 % external solution       . Fluticasone-Salmeterol (ADVAIR DISKUS) 100-50 MCG/DOSE AEPB Inhale 1 puff into the lungs 2 (two) times daily.  60 each  5  . mometasone (NASONEX) 50 MCG/ACT nasal spray Place 2 sprays into the nose daily.  17 g  12  . montelukast (SINGULAIR) 10 MG tablet Take 1 tablet (10 mg total) by mouth at bedtime.  30 tablet  5   Current Facility-Administered Medications   Medication Dose Route Frequency Provider Last Rate Last Dose  . albuterol (PROVENTIL) (2.5 MG/3ML) 0.083% nebulizer solution 2.5 mg  2.5 mg Nebulization Once Thao P Le, DO       Other; Amoxicillin; Codeine; and Tramadol Family History  Problem Relation Age of Onset  . Lung disease Father   . Pulmonary embolism Father    Social History:   reports that she has never smoked. She has never used smokeless tobacco. She reports that she drinks alcohol. She reports that she does not use illicit drugs.   REVIEW OF SYSTEMS - PERTINENT POSITIVES ONLY: Negative for DVT. She does have asthma which has been getting worse with her weight. She denies reflux.  Physical Exam:   Blood pressure 140/92, pulse 104, temperature 98.2 F (36.8 C), temperature source Temporal, resp. rate 15, height 5' (1.524 m), weight 283 lb (128.368 kg), last menstrual period 03/26/2013. Body mass index is 55.27 kg/(m^2).  Gen:  WDWN white female NAD  Neurological: Alert and oriented to person, place, and time. Motor and sensory function is grossly intact  Head: Normocephalic and atraumatic.  Eyes: Conjunctivae are normal. Pupils are equal, round, and reactive to light. No scleral icterus.  Neck: Normal range of motion. Neck supple. No tracheal deviation or thyromegaly present.  Cardiovascular:  SR without murmurs or gallops.  No carotid bruits  Respiratory: Effort normal.  No respiratory distress. No chest wall tenderness. Breath sounds normal.  No wheezes, rales or rhonchi.  Abdomen:  Obese nontender. GU: Musculoskeletal: Normal range of motion. Extremities are nontender. No cyanosis, edema or clubbing noted Lymphadenopathy: No cervical, preauricular, postauricular or axillary adenopathy is present Skin: Skin is warm and dry. No rash noted. No diaphoresis. No erythema. No pallor. Pscyh: Normal mood and affect. Behavior is normal. Judgment and thought content normal.   LABORATORY RESULTS: No results found for this or any  previous visit (from the past 48 hour(s)).  RADIOLOGY RESULTS: No results found.  Problem List: Patient Active Problem List   Diagnosis Date Noted  . Morbid obesity 09/21/2012  . Asthma 05/14/2011    Assessment & Plan: Morbid obesity BMI 55. Plan will work toward laparoscopic sleeve gastrectomy    [name] Done, MD, [facility] [phone] beeper [phone]  04/26/2013 5:02 PM

## 2013-05-03 LAB — HEMOGLOBIN A1C
HEMOGLOBIN A1C: 5.1 % (ref <5.7)
MEAN PLASMA GLUCOSE: 100 mg/dL (ref <117)

## 2013-05-03 LAB — COMPREHENSIVE METABOLIC PANEL
ALBUMIN: 4.3 g/dL (ref 3.5–5.2)
ALK PHOS: 71 U/L (ref 39–117)
ALT: 16 U/L (ref 0–35)
AST: 18 U/L (ref 0–37)
BUN: 10 mg/dL (ref 6–23)
CHLORIDE: 102 meq/L (ref 96–112)
CO2: 26 mEq/L (ref 19–32)
Calcium: 9.4 mg/dL (ref 8.4–10.5)
Creat: 0.74 mg/dL (ref 0.50–1.10)
Glucose, Bld: 90 mg/dL (ref 70–99)
POTASSIUM: 4 meq/L (ref 3.5–5.3)
Sodium: 137 mEq/L (ref 135–145)
Total Bilirubin: 0.4 mg/dL (ref 0.2–1.2)
Total Protein: 6.9 g/dL (ref 6.0–8.3)

## 2013-05-03 LAB — CBC WITH DIFFERENTIAL/PLATELET
BASOS ABS: 0 10*3/uL (ref 0.0–0.1)
BASOS PCT: 0 % (ref 0–1)
Eosinophils Absolute: 0 10*3/uL (ref 0.0–0.7)
Eosinophils Relative: 0 % (ref 0–5)
HEMATOCRIT: 39.9 % (ref 36.0–46.0)
Hemoglobin: 13.5 g/dL (ref 12.0–15.0)
Lymphocytes Relative: 24 % (ref 12–46)
Lymphs Abs: 2.8 10*3/uL (ref 0.7–4.0)
MCH: 32.1 pg (ref 26.0–34.0)
MCHC: 33.8 g/dL (ref 30.0–36.0)
MCV: 94.8 fL (ref 78.0–100.0)
MONO ABS: 0.7 10*3/uL (ref 0.1–1.0)
Monocytes Relative: 6 % (ref 3–12)
NEUTROS ABS: 8.2 10*3/uL — AB (ref 1.7–7.7)
Neutrophils Relative %: 70 % (ref 43–77)
Platelets: 317 10*3/uL (ref 150–400)
RBC: 4.21 MIL/uL (ref 3.87–5.11)
RDW: 13 % (ref 11.5–15.5)
WBC: 11.8 10*3/uL — ABNORMAL HIGH (ref 4.0–10.5)

## 2013-05-03 LAB — T4: T4, Total: 9.7 ug/dL (ref 5.0–12.5)

## 2013-05-03 LAB — TSH: TSH: 1.405 u[IU]/mL (ref 0.350–4.500)

## 2013-05-07 NOTE — Addendum Note (Signed)
Addended by: Ivor Costa on: 05/07/2013 02:27 PM   Modules accepted: Orders

## 2013-05-08 ENCOUNTER — Encounter: Payer: Self-pay | Admitting: Dietician

## 2013-05-08 ENCOUNTER — Encounter: Payer: BC Managed Care – PPO | Attending: Surgery | Admitting: Dietician

## 2013-05-08 DIAGNOSIS — Z01818 Encounter for other preprocedural examination: Secondary | ICD-10-CM | POA: Insufficient documentation

## 2013-05-08 DIAGNOSIS — Z713 Dietary counseling and surveillance: Secondary | ICD-10-CM | POA: Insufficient documentation

## 2013-05-08 NOTE — Progress Notes (Signed)
  Pre-Op Assessment Visit:  Pre-Operative Gastric Sleeve Surgery  Medical Nutrition Therapy:  Appt start time: 0034   End time:  1130.  Patient was seen on 05/08/2013 for Pre-Operative Gastric Sleeve Nutrition Assessment. Assessment and letter of approval faxed to [facility] Bariatric Surgery Program coordinator on 05/08/2013.   Handouts given during visit include:  Pre-Op Goals Bariatric Surgery Protein Shakes  Patient to call the [facility] to enroll in Pre-Op and Post-Op Nutrition Education when surgery date is scheduled.

## 2013-05-08 NOTE — Patient Instructions (Signed)
Patient to call the [facility] to enroll in Pre-Op and Post-Op Nutrition Education when surgery date is scheduled. 

## 2013-05-12 ENCOUNTER — Ambulatory Visit (INDEPENDENT_AMBULATORY_CARE_PROVIDER_SITE_OTHER): Payer: Federal, State, Local not specified - PPO | Admitting: Family Medicine

## 2013-05-12 VITALS — BP 134/84 | HR 83 | Temp 98.3°F | Resp 18 | Ht 60.0 in | Wt 278.2 lb

## 2013-05-12 DIAGNOSIS — Z0289 Encounter for other administrative examinations: Secondary | ICD-10-CM

## 2013-05-12 NOTE — Progress Notes (Signed)
    Chief Complaint:  Chief Complaint  Patient presents with  . Other    Bariatric sx review paperwork    HPI: [name] [name] is a 37 y.o. female who is here for  Bariatric surgery evaluation.  She is planning to get this done as soon as possible, she has gone to see the nutritionist and now is scheduled to have a psychology evaulation  The surgery will be done by Dr Matthew Martin. She has been on a "gizillion diets", These diest include meals palns, personal trainers, medicines. However all these diets and exercises failed for one reason or another.  1 1/2 year ago she was working out by herself and then her right foot started hurting andshe had to have a nerve  removed and she regained whatever she weight she ahd lost when she was regularly exercising.  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 She ahs also been on Atkins and weight watchers.  She was also given Phentermine but the pills cause her to have the jitters She has done the Slim Fast diet and that also made her jittery  Atkins diet in 2012 di dnot help her lose weight, she stopped because she did not see   Past Medical History  Diagnosis Date  . Allergy   . Asthma   . Arthritis   . Obesity    Past Surgical History  Procedure Laterality Date  . Knee arthroscopy    . Foot neuroma surgery    . Tubal ligation     History   Social History  . Marital Status: Single    Spouse Name: [name]    Number of Children: [name]  . Years of Education: [name]   Social History Main Topics  . Smoking status: Never Smoker   . Smokeless tobacco: Never Used  . Alcohol Use: Yes     Comment: occasionally  . Drug Use: No  . Sexual Activity: None   Other Topics Concern  . None   Social History Narrative  . None     Family History  Problem Relation Age of Onset  . Lung disease Father   . Pulmonary embolism Father   . COPD Other   . Diabetes Other    Allergies  Allergen Reactions  . Other Other (See Comments)    Nut allergy  . Amoxicillin Rash  . Codeine Nausea And Vomiting and Rash  . Tramadol Nausea And Vomiting and Rash   Prior to Admission medications   Medication Sig Start Date End Date Taking? Authorizing Provider  albuterol (PROVENTIL HFA;VENTOLIN HFA) 108 (90 BASE) MCG/ACT inhaler Inhale 1-2 puffs into the lungs every 6 (six) hours as needed for wheezing or shortness of breath. 04/20/13  Yes Jennifer Lee Presson, PA  Antiseborrheic Products, Misc. (PROMISEB) CREA  02/16/13  Yes Historical Provider, MD  ASMANEX 60 METERED DOSES 220 MCG/INH inhaler  01/23/13  Yes Historical Provider, MD  clobetasol (TEMOVATE) 0.05 % external solution  03/20/13  Yes Historical Provider, MD  Fluticasone-Salmeterol (ADVAIR DISKUS) 100-50 MCG/DOSE AEPB Inhale 1 puff into the lungs 2 (two) times daily. 03/10/13  Yes Thao P Le, DO  mometasone (NASONEX) 50 MCG/ACT nasal spray Place 2 sprays into the nose daily. 01/22/13  Yes Thao P Le,   DO  montelukast (SINGULAIR) 10 MG tablet Take 1 tablet (10 mg total) by mouth at bedtime. 01/22/13  Yes Thao P Le, DO  benzonatate (TESSALON) 100 MG capsule Take 1 capsule (100 mg total) by mouth 3 (three) times daily as needed for cough. Swallow whole 04/20/13   Jennifer Lee Presson, PA  chlorpheniramine-HYDROcodone (TUSSIONEX PENNKINETIC ER) 10-8 MG/5ML LQCR Take 5 mLs by mouth every 12 (twelve) hours as needed. 04/21/13   Jeffery Anderson, MD     ROS: The patient denies fevers, chills, night sweats, unintentional weight loss, chest pain, palpitations, wheezing, dyspnea on exertion, nausea, vomiting, abdominal pain, dysuria, hematuria, melena, numbness, weakness, or tingling.  All other systems have been reviewed and were otherwise negative with the exception of those mentioned in  the HPI and as above.    PHYSICAL EXAM: Filed Vitals:   05/12/13 1653  BP: 134/84  Pulse: 83  Temp: 98.3 F (36.8 C)  Resp: 18   Filed Vitals:   05/12/13 1653  Height: 5' (1.524 m)  Weight: 278 lb 3.2 oz (126.191 kg)   Body mass index is 54.33 kg/(m^2).  General: Alert, no acute distress, obese female HEENT:  Normocephalic, atraumatic, oropharynx patent. EOMI, PERRLA, fundoscopic exam nl Cardiovascular:  Regular rate and rhythm, no rubs murmurs or gallops.  No Carotid bruits, radial pulse intact. No pedal edema.  Respiratory: Clear to auscultation bilaterally.  No wheezes, rales, or rhonchi.  No cyanosis, no use of accessory musculature GI: No organomegaly, abdomen is soft and non-tender, positive bowel sounds.  No masses. Skin: No rashes. Neurologic: Facial musculature symmetric. Psychiatric: Patient is appropriate throughout our interaction. Lymphatic: No cervical lymphadenopathy Musculoskeletal: Gait intact.   LABS: Results for orders placed in visit on 04/26/13  CBC WITH DIFFERENTIAL      Result Value Range   WBC 11.8 (*) 4.0 - 10.5 K/uL   RBC 4.21  3.87 - 5.11 MIL/uL   Hemoglobin 13.5  12.0 - 15.0 g/dL   HCT 39.9  36.0 - 46.0 %   MCV 94.8  78.0 - 100.0 fL   MCH 32.1  26.0 - 34.0 pg   MCHC 33.8  30.0 - 36.0 g/dL   RDW 13.0  11.5 - 15.5 %   Platelets 317  150 - 400 K/uL   Neutrophils Relative % 70  43 - 77 %   Neutro Abs 8.2 (*) 1.7 - 7.7 K/uL   Lymphocytes Relative 24  12 - 46 %   Lymphs Abs 2.8  0.7 - 4.0 K/uL   Monocytes Relative 6  3 - 12 %   Monocytes Absolute 0.7  0.1 - 1.0 K/uL   Eosinophils Relative 0  0 - 5 %   Eosinophils Absolute 0.0  0.0 - 0.7 K/uL   Basophils Relative 0  0 - 1 %   Basophils Absolute 0.0  0.0 - 0.1 K/uL   Smear Review Criteria for review not met    COMPREHENSIVE METABOLIC PANEL      Result Value Range   Sodium 137  135 - 145 mEq/L   Potassium 4.0  3.5 - 5.3 mEq/L   Chloride 102  96 - 112 mEq/L   CO2 26  19 - 32 mEq/L    Glucose, Bld 90  70 - 99 mg/dL   BUN 10  6 - 23 mg/dL   Creat 0.74  0.50 - 1.10 mg/dL   Total Bilirubin 0.4  0.2 - 1.2 mg/dL   Alkaline Phosphatase 71  39 - 117 U/L     AST 18  0 - 37 U/L   ALT 16  0 - 35 U/L   Total Protein 6.9  6.0 - 8.3 g/dL   Albumin 4.3  3.5 - 5.2 g/dL   Calcium 9.4  8.4 - 10.5 mg/dL  TSH      Result Value Range   TSH 1.405  0.350 - 4.500 uIU/mL  T4      Result Value Range   T4, Total 9.7  5.0 - 12.5 ug/dL  HEMOGLOBIN A1C      Result Value Range   Hemoglobin A1C 5.1  <5.7 %   Mean Plasma Glucose 100  <117 mg/dL     EKG/XRAY:   Primary read interpreted by Dr. Marin Comment at [facility].   ASSESSMENT/PLAN: Encounter Diagnoses  Name Primary?  . Other general medical examination for administrative purposes Yes  . Morbid obesity    Very pleasant 38 year old morbidly obese female who has been struggling with weight loss and is here for evaluation for bariatric surgery with Dr Rockne Coons. She is currently well. She has tried many diet before without success. She also has had challenges with recurrent bouts of asthmatic bronchitis due to her job exposure to allergens and her obesity has also been a contributing factor in her recovery in the past. She has arthritis as well due to obesity.  Again today she is well. Filled out forms for [facility], faxed to their office F/u prn  Gross sideeffects, risk and benefits, and alternatives of medications d/w patient. Patient is aware that all medications have potential sideeffects and we are unable to predict every sideeffect or drug-drug interaction that may occur.  LE, [place], DO 05/12/2013 5:32 PM

## 2013-05-15 ENCOUNTER — Ambulatory Visit (HOSPITAL_COMMUNITY)
Admission: RE | Admit: 2013-05-15 | Discharge: 2013-05-15 | Disposition: A | Discharge status: Home / Self Care | Payer: BC Managed Care – PPO | Source: Ambulatory Visit | Attending: Surgery | Admitting: Surgery

## 2013-05-15 ENCOUNTER — Other Ambulatory Visit: Payer: Self-pay

## 2013-05-15 DIAGNOSIS — I700 Atherosclerosis of aorta: Secondary | ICD-10-CM | POA: Insufficient documentation

## 2013-05-15 DIAGNOSIS — J45909 Unspecified asthma, uncomplicated: Secondary | ICD-10-CM | POA: Insufficient documentation

## 2013-05-15 DIAGNOSIS — K8020 Calculus of gallbladder without cholecystitis without obstruction: Secondary | ICD-10-CM | POA: Insufficient documentation

## 2013-05-15 DIAGNOSIS — K219 Gastro-esophageal reflux disease without esophagitis: Secondary | ICD-10-CM | POA: Insufficient documentation

## 2013-05-15 DIAGNOSIS — Z6841 Body Mass Index (BMI) 40.0 and over, adult: Secondary | ICD-10-CM | POA: Insufficient documentation

## 2013-06-26 ENCOUNTER — Other Ambulatory Visit (INDEPENDENT_AMBULATORY_CARE_PROVIDER_SITE_OTHER): Payer: Self-pay

## 2013-06-26 DIAGNOSIS — G4733 Obstructive sleep apnea (adult) (pediatric): Secondary | ICD-10-CM

## 2013-07-08 ENCOUNTER — Ambulatory Visit (INDEPENDENT_AMBULATORY_CARE_PROVIDER_SITE_OTHER): Payer: Federal, State, Local not specified - PPO | Admitting: Family Medicine

## 2013-07-08 VITALS — BP 130/90 | HR 93 | Temp 97.7°F | Resp 16 | Ht 60.5 in | Wt 281.0 lb

## 2013-07-08 DIAGNOSIS — Z8709 Personal history of other diseases of the respiratory system: Secondary | ICD-10-CM

## 2013-07-08 DIAGNOSIS — J309 Allergic rhinitis, unspecified: Secondary | ICD-10-CM

## 2013-07-08 DIAGNOSIS — J45909 Unspecified asthma, uncomplicated: Secondary | ICD-10-CM

## 2013-07-08 MED ORDER — PREDNISONE 20 MG PO TABS: ORAL_TABLET | ORAL | Status: DC

## 2013-07-08 MED ORDER — AZITHROMYCIN 250 MG PO TABS: ORAL_TABLET | ORAL | Status: DC

## 2013-07-08 NOTE — Patient Instructions (Signed)
Drink plenty of fluids get enough rest  Take the prednisone 3 pills daily for 2 days,, then daily for 2 days, then one daily for 2 days. Take the initial dose this evening, but after today take the medicine in the mornings.   If not improved over the next 3 days, or if you start blowing out a lot of yellow and green mucus, did the azithromycin filled and take it.  Return if worse or not improving.

## 2013-07-08 NOTE — Progress Notes (Signed)
Subjective: 38 year old lady who has a history of asthma and allergy problems. She takes Singulair and uses albuterol and Advair inhaler. She does not smoke. She is not diabetic. She has been having symptoms for about 3 or 4 days of upper respiratory congestion. She's had some coughing and wheezing. She uses her medicines. She was congested in the head and had congestion coming out of her nose until today, and today it is all plugged up. She is painful in her maxillary sinuses primarily. 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 She missed work today. She felt hot at home and took her temperature but was afebrile.  Objective: Afebrile. TMs normal. Nose congested. Tenderness over the frontal and maxillary sinuses, primarily the right maxillary sinus. Chest is clear to auscultation. May be the tiniest bit of an in expiratory wheeze on forced expiration. Heart regular without murmurs.  Assessment: Allergic sinusitis and bronchitis History of asthma  Plan: Continue her current medications. Recommend taking an over-the-counter antihistamine such as Allegra or Claritin or Zyrtec along with the other medications such as her Singulair and inhalers. Continue using the nasal steroid inhaler. Will treat with a course of prednisone to try  Counteract the allergic sinusitis If she starts blowing a lot of purulent stuff out of her nose we will give her a course of azithromycin. However I think she will clear up well with the prednisone.

## 2013-08-06 ENCOUNTER — Ambulatory Visit (HOSPITAL_BASED_OUTPATIENT_CLINIC_OR_DEPARTMENT_OTHER): Payer: BC Managed Care – PPO | Attending: Surgery | Admitting: Radiology

## 2013-08-06 VITALS — Ht 60.0 in | Wt 275.0 lb

## 2013-08-06 DIAGNOSIS — G4733 Obstructive sleep apnea (adult) (pediatric): Secondary | ICD-10-CM | POA: Insufficient documentation

## 2013-08-07 ENCOUNTER — Encounter (HOSPITAL_BASED_OUTPATIENT_CLINIC_OR_DEPARTMENT_OTHER): Payer: BC Managed Care – PPO

## 2013-08-10 DIAGNOSIS — G4710 Hypersomnia, unspecified: Secondary | ICD-10-CM

## 2013-08-10 DIAGNOSIS — G4730 Sleep apnea, unspecified: Secondary | ICD-10-CM

## 2013-08-10 NOTE — Sleep Study (Signed)
   NAME: [name] [name] DATE OF BIRTH:  06/06/1975 MEDICAL RECORD NUMBER [id number]  LOCATION: [facility]  PHYSICIAN: Clinton D Young  DATE OF STUDY: 08/06/2013  SLEEP STUDY TYPE: Nocturnal Polysomnogram               REFERRING PHYSICIAN: [name], MD  INDICATION FOR STUDY: Hypersomnia with sleep apnea  EPWORTH SLEEPINESS SCORE:   1/24 HEIGHT: 5' (152.4 cm)  WEIGHT: 275 lb (124.739 kg)    Body mass index is 53.71 kg/(m^2).  NECK SIZE: 16 in.  MEDICATIONS: Charted for review  SLEEP ARCHITECTURE: Total sleep time 383.5 minutes with sleep efficiency 89.7%. Stage I was 7.3%, stage II 63.5%, stage III 9.8%, REM 19.4% of total sleep time. Sleep latency 21.5 minutes, REM latency 114 minutes, awake after sleep onset 24 minutes, arousal index 14.1. Bedtime medication: None.  RESPIRATORY DATA: Apnea hypopnea index (AHI) 5.3 per hour. 34 total events scored including one obstructive apnea and 33 hypopneas. All events were associated with supine sleep position. REM AHI 16.9 per hour. There were not enough events to permit a split protocol CPAP titration.  OXYGEN DATA: Moderately loud snoring with oxygen desaturation to a nadir of 87% and mean oxygen saturation through the study of 93.6% on room air.  CARDIAC DATA: Sinus rhythm with PACs  MOVEMENT/PARASOMNIA: A total of 20 limbs jerks were counted, none of which were associated with arousals or awakenings. Bathroom x1  IMPRESSION/ RECOMMENDATION:   1) Minimal obstructive sleep apnea/hypoxia syndrome, AHI 5.3 per hour. Supine and REM related events. REM AHI 16.9 per hour. Moderately loud snoring with oxygen desaturation to a nadir of 87% and mean oxygen saturation through the study  of 93.6% on room air.  2) There were not enough events to permit application of split protocol CPAP titration. Conservative measures including weight loss [name] be most appropriate depending on individual circumstances.  Signed  Baird Lyons M.D. [place], [facility]  ELECTRONICALLY SIGNED ON:  08/10/2013, 11:30 AM [place] PH: [phone]   FX: [phone] [place]

## 2013-08-13 ENCOUNTER — Encounter (HOSPITAL_BASED_OUTPATIENT_CLINIC_OR_DEPARTMENT_OTHER): Payer: BC Managed Care – PPO

## 2013-08-16 DIAGNOSIS — G4730 Sleep apnea, unspecified: Secondary | ICD-10-CM

## 2013-08-16 HISTORY — DX: Sleep apnea, unspecified: G47.30

## 2013-09-05 ENCOUNTER — Ambulatory Visit (INDEPENDENT_AMBULATORY_CARE_PROVIDER_SITE_OTHER): Payer: Federal, State, Local not specified - PPO | Admitting: Physician Assistant

## 2013-09-05 VITALS — BP 110/70 | HR 96 | Temp 97.9°F | Ht 60.0 in | Wt 285.0 lb

## 2013-09-05 DIAGNOSIS — N390 Urinary tract infection, site not specified: Secondary | ICD-10-CM

## 2013-09-05 DIAGNOSIS — R350 Frequency of micturition: Secondary | ICD-10-CM

## 2013-09-05 DIAGNOSIS — R300 Dysuria: Secondary | ICD-10-CM

## 2013-09-05 LAB — POCT UA - MICROSCOPIC ONLY
Casts, Ur, LPF, POC: NEGATIVE
Crystals, Ur, HPF, POC: NEGATIVE
Mucus, UA: NEGATIVE
RBC, urine, microscopic: NEGATIVE
Yeast, UA: NEGATIVE

## 2013-09-05 LAB — POCT URINALYSIS DIPSTICK
Bilirubin, UA: NEGATIVE
Blood, UA: NEGATIVE
Glucose, UA: NEGATIVE
Nitrite, UA: NEGATIVE
Spec Grav, UA: 1.025
Urobilinogen, UA: 0.2
pH, UA: 6

## 2013-09-05 MED ORDER — PHENAZOPYRIDINE HCL 200 MG PO TABS: 200.0000 mg | ORAL_TABLET | Freq: Three times a day (TID) | ORAL | Status: DC | PRN

## 2013-09-05 MED ORDER — CIPROFLOXACIN HCL 250 MG PO TABS: 250.0000 mg | ORAL_TABLET | Freq: Two times a day (BID) | ORAL | Status: DC

## 2013-09-05 NOTE — Progress Notes (Deleted)
   Subjective:    Patient ID: [name] [name], female    DOB: [birth year], 38 y.o.   MRN: [id number]  HPI  Yesterday - pressure, lower back pain, suprapubic pressure/pain, urinary frequency,  No hematuria, fever, chills, nausea, vomiting  Review of Systems     Objective:   Physical Exam        Assessment & Plan:

## 2013-09-05 NOTE — Progress Notes (Signed)
Patient ID: [name] [name] MRN: [id number], DOB: [birth year], 38 y.o. Date of Encounter: 09/05/2013, 9:24 PM  Primary Physician: [name], MD  Chief Complaint: urinary frequency and dysuria  HPI: 38 y.o. year old female with presents with 2 day history of urinary frequency, dysuria, and suprapubic pressure. Denies flank pain, nausea, vomiting, fever, chills, vaginal discharge, or odor. Has not taken any OTC medications for this. LNMP 08/26/13. 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  Hx of frequent UTI's Patient is otherwise doing well without issues or complaints.  Past Medical History  Diagnosis Date  . Allergy   . Asthma   . Arthritis   . Obesity      Home Meds: Prior to Admission medications   Medication Sig Start Date End Date Taking? Authorizing Provider  albuterol (PROVENTIL HFA;VENTOLIN HFA) 108 (90 BASE) MCG/ACT inhaler Inhale 1-2 puffs into the lungs every 6 (six) hours as needed for wheezing or shortness of breath. 04/20/13  Yes Lahoma Rocker, PA  Antiseborrheic Products, [place]. (PROMISEB) CREA  02/16/13  Yes Historical Provider, MD  ASMANEX 60 METERED DOSES 220 MCG/INH inhaler  01/23/13  Yes Historical Provider, MD  clobetasol (TEMOVATE) 0.05 % external solution  03/20/13  Yes Historical Provider, MD  Fluticasone-Salmeterol (ADVAIR DISKUS) 100-50 MCG/DOSE AEPB Inhale 1 puff into the lungs 2 (two) times daily. 03/10/13  Yes Thao P Le, DO  mometasone (NASONEX) 50 MCG/ACT nasal spray Place 2 sprays into the nose daily. 01/22/13  Yes Thao P Le, DO  montelukast (SINGULAIR) 10 MG tablet Take 1 tablet (10 mg total) by mouth at bedtime. 01/22/13  Yes Thao P Le, DO  ciprofloxacin (CIPRO) 250 MG tablet Take 1 tablet (250 mg total) by mouth 2 (two) times daily. 09/05/13   Daltyn Degroat Elnora Morrison, PA-C  phenazopyridine (PYRIDIUM) 200 MG tablet Take 1 tablet (200 mg total) by mouth 3  (three) times daily as needed for pain. 09/05/13   Collene Leyden, PA-C    Allergies:  Allergies  Allergen Reactions  . Other Other (See Comments)    Nut allergy  . Amoxicillin Rash  . Codeine Nausea And Vomiting and Rash  . Tramadol Nausea And Vomiting and Rash    History   Social History  . Marital Status: Single    Spouse Name: [name]    Number of Children: [name]  . Years of Education: [name]   Occupational History  . Not on file.   Social History Main Topics  . Smoking status: Never Smoker   . Smokeless tobacco: Never Used  . Alcohol Use: Yes     Comment: occasionally  . Drug Use: No  . Sexual Activity: Not on file   Other Topics Concern  . Not on file   Social History Narrative  . No narrative on file     Review of Systems: Constitutional: negative for chills, fever, night sweats, weight changes, or fatigue  HEENT: negative for vision changes, hearing loss, congestion, rhinorrhea, ST, epistaxis, or sinus pressure Cardiovascular: negative for chest pain or palpitations Respiratory: negative for cough, hemoptysis, wheezing, shortness of breath. Abdominal: positive for suprapubic abdominal pain,   No nausea, vomiting, diarrhea, or constipation Genitourinary: positive for urinary frequency and dysuria. Negative for vaginal discharge, odor, or pelvic pain.  Dermatological: negative for rashes. Neurologic: negative for headache, dizziness, or syncope   Physical Exam: Blood pressure 110/70, pulse 96, temperature 97.9  F (36.6 C), temperature source Oral, height 5' (1.524 m), weight 285 lb (129.275 kg), SpO2 99.00%., Body mass index is 55.66 kg/(m^2). General: Well developed, well nourished, in no acute distress. Head: Normocephalic, atraumatic, eyes without discharge, sclera non-icteric, nares are without discharge. External ear normal in appearance. Neck: Supple. No thyromegaly. Full ROM. No lymphadenopathy. Lungs: Clear bilaterally to auscultation without wheezes,  rales, or rhonchi. Breathing is unlabored. Heart: RRR with S1 S2. No murmurs, rubs, or gallops appreciated. Abdominal: +BS x 4. No hepatosplenomegaly, rebound tenderness, or guarding. Positive suprapubic tenderness. No CVA tenderness bilaterally.  Msk:  Strength and tone normal for age. Extremities/Skin: Warm and dry. No clubbing or cyanosis. No edema.  Neuro: Alert and oriented X 3. Moves all extremities spontaneously. Gait is normal. CNII-XII grossly in tact. Psych:  Responds to questions appropriately with a normal affect.   Labs: Results for orders placed in visit on 09/05/13  POCT UA - MICROSCOPIC ONLY      Result Value Ref Range   WBC, Ur, HPF, POC tntc     RBC, urine, microscopic neg     Bacteria, U Microscopic trace     Mucus, UA neg     Epithelial cells, urine per micros 1-6     Crystals, Ur, HPF, POC neg     Casts, Ur, LPF, POC neg     Yeast, UA neg    POCT URINALYSIS DIPSTICK      Result Value Ref Range   Color, UA yellow     Clarity, UA slightly cloudy     Glucose, UA neg     Bilirubin, UA neg     Ketones, UA trace     Spec Grav, UA 1.025     Blood, UA neg     pH, UA 6.0     Protein, UA trace     Urobilinogen, UA 0.2     Nitrite, UA neg     Leukocytes, UA small (1+)       ASSESSMENT AND PLAN:  38 y.o. year old female with urinary tract infection Urine culture sent Increase fluids Cipro 250 mg bid x 5 days Pyridium tid needed for symptomatic relief Follow up if symptoms worsen or fail to improve.  Angela Nevin, PA-C 09/05/2013 9:24 PM

## 2013-09-07 LAB — URINE CULTURE: Colony Count: 9000

## 2013-09-09 ENCOUNTER — Ambulatory Visit: Payer: BC Managed Care – PPO

## 2013-09-24 ENCOUNTER — Ambulatory Visit (INDEPENDENT_AMBULATORY_CARE_PROVIDER_SITE_OTHER): Payer: BC Managed Care – PPO | Admitting: Surgery

## 2013-10-08 ENCOUNTER — Ambulatory Visit: Payer: BC Managed Care – PPO

## 2013-10-14 ENCOUNTER — Encounter: Payer: BC Managed Care – PPO | Attending: Surgery

## 2013-10-14 DIAGNOSIS — Z6841 Body Mass Index (BMI) 40.0 and over, adult: Secondary | ICD-10-CM | POA: Insufficient documentation

## 2013-10-14 DIAGNOSIS — Z713 Dietary counseling and surveillance: Secondary | ICD-10-CM | POA: Diagnosis not present

## 2013-10-14 NOTE — Progress Notes (Signed)
Dr. Hassell Done  -  Please enter preop orders in Epic for [facility] - she is coming to [facility] on 7/22 for preop / labs.  Thanks.

## 2013-10-15 ENCOUNTER — Ambulatory Visit (INDEPENDENT_AMBULATORY_CARE_PROVIDER_SITE_OTHER): Payer: BC Managed Care – PPO | Admitting: Surgery

## 2013-10-15 ENCOUNTER — Encounter (INDEPENDENT_AMBULATORY_CARE_PROVIDER_SITE_OTHER): Payer: Self-pay | Admitting: Surgery

## 2013-10-15 VITALS — BP 126/76 | HR 88 | Temp 98.0°F | Resp 16 | Ht 60.0 in | Wt 287.0 lb

## 2013-10-15 DIAGNOSIS — K8020 Calculus of gallbladder without cholecystitis without obstruction: Secondary | ICD-10-CM

## 2013-10-15 MED ORDER — HYDROCODONE-ACETAMINOPHEN 7.5-325 MG/15ML PO SOLN: 10.0000 mL | Freq: Four times a day (QID) | ORAL | Status: DC | PRN

## 2013-10-15 NOTE — Progress Notes (Signed)
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  We explored her history and she does have problems with asthma in his recently had to take steroids for this. In addition she has arthritic pains and takes nonsteroidal anti-inflammatories. Those 2 historical facts led [place] to gravitate more toward her having a sleeve gastrectomy. She is excieted and ready to have surgery.  I answered the remainder of her questions.  Orders in EPIC  Her only prior abdominal surgery was a tubal ligation. She's otherwise been healthy.  Past Medical History  Diagnosis Date  . Allergy   . Asthma   . Arthritis   . Obesity     Past Surgical History  Procedure Laterality Date  . Knee arthroscopy    . Foot neuroma surgery    . Tubal ligation      Current Outpatient Prescriptions  Medication Sig Dispense Refill  . albuterol (PROVENTIL HFA;VENTOLIN HFA) 108 (90 BASE) MCG/ACT inhaler Inhale 1-2 puffs into the lungs every 6 (six) hours as needed for wheezing or shortness of breath.  1 Inhaler  0  . Antiseborrheic Products, Misc. (PROMISEB) CREA       . ASMANEX 60 METERED DOSES 220 MCG/INH inhaler       . clobetasol (TEMOVATE) 0.05 % external solution       . Fluticasone-Salmeterol (ADVAIR DISKUS) 100-50 MCG/DOSE AEPB Inhale 1 puff into the lungs 2 (two) times daily.  60 each  5  . mometasone (NASONEX) 50 MCG/ACT nasal spray Place 2 sprays into the nose daily.  17 g  12  . montelukast (SINGULAIR) 10 MG tablet Take 1 tablet (10 mg total) by mouth at bedtime.  30 tablet  5  . phenazopyridine (PYRIDIUM) 200 MG tablet Take 1 tablet (200 mg total) by mouth 3 (three) times daily as needed for pain.  10 tablet  0  . HYDROcodone-acetaminophen (HYCET) 7.5-325 mg/15 ml solution Take 10 mLs by mouth 4 (four)  times daily as needed for moderate pain.  300 mL  0   Current Facility-Administered Medications  Medication Dose Route Frequency Provider Last Rate Last Dose  . albuterol (PROVENTIL) (2.5 MG/3ML) 0.083% nebulizer solution 2.5 mg  2.5 mg Nebulization Once Thao P Le, DO       Other; Amoxicillin; Codeine; and Tramadol Family History  Problem Relation Age of Onset  . Lung disease Father   . Pulmonary embolism Father   . COPD Other   . Diabetes Other    Social History:   reports that she has never smoked. She has never used smokeless tobacco. She reports that she drinks alcohol. She reports that she does not use illicit drugs.   REVIEW OF SYSTEMS - PERTINENT POSITIVES ONLY: Negative for DVT. She does have asthma which has been getting worse with her weight. She denies reflux.  Physical Exam:   Blood pressure 126/76, pulse 88, temperature 98 F (36.7 C), resp. rate 16, height 5' (1.524 m), weight 287 lb (130.182 kg). Body mass index is 56.05 kg/(m^2).  Gen:  WDWN white female NAD  Neurological: Alert and oriented to person, place, and time. Motor and sensory function is grossly intact  Head: Normocephalic and atraumatic.  Eyes: Conjunctivae are normal. Pupils are equal, round, and reactive to light. No  scleral icterus.  Neck: Normal range of motion. Neck supple. No tracheal deviation or thyromegaly present.  Cardiovascular:  SR without murmurs or gallops.  No carotid bruits Respiratory: Effort normal.  No respiratory distress. No chest wall tenderness. Breath sounds normal.  No wheezes, rales or rhonchi.  Abdomen:  Obese nontender. GU: Musculoskeletal: Normal range of motion. Extremities are nontender. No cyanosis, edema or clubbing noted Lymphadenopathy: No cervical, preauricular, postauricular or axillary adenopathy is present Skin: Skin is warm and dry. No rash noted. No diaphoresis. No erythema. No pallor. Pscyh: Normal mood and affect. Behavior is normal. Judgment and thought  content normal.   LABORATORY RESULTS: No results found for this or any previous visit (from the past 48 hour(s)).  RADIOLOGY RESULTS: No results found.  Problem List: Patient Active Problem List   Diagnosis Date Noted  . Gallstones 10/15/2013  . Morbid obesity 09/21/2012  . Asthma 05/14/2011    Assessment & Plan: Morbid obesity BMI 55. Plan sleeve gastrectomy.  Orders in EPIC.  Discussed observation of GB (has stones).  UGI showed some reflux on siphon but she denies any symptoms of GERD.      [name] Done, MD, [facility] [phone] beeper [phone]  10/15/2013 9:45 AM

## 2013-10-15 NOTE — Patient Instructions (Signed)

## 2013-10-16 NOTE — Progress Notes (Signed)
  Pre-Operative Nutrition Class:  Appt start time: 3338   End time:  1830.  Patient was seen on 10/14/2013 for Pre-Operative Bariatric Surgery Education [facility].   Surgery date: 10/29/2013 Surgery type: Gastric sleeve Start weight at [facility]: 277.5 lbs on 05/08/2013 Weight today: 286 lbs  TANITA  BODY COMP RESULTS  10/14/13   BMI (kg/m^2) 55.9   Fat Mass (lbs) 150   Fat Free Mass (lbs) 136   Total Body Water (lbs) 99.5   Samples given per MNT protocol. Patient educated on appropriate usage: Premier protein shake (strawberry - qty 1) Lot #: [id number] Exp: 08/2014  Bariatric Advantage Calcium Citrate (chocolate - qty 1) Lot #: [id number] Exp: 09/2014  Celebrate Vitamins Multivitamin (orange - qty 1) Lot #: [id number] Exp: 05/2014  Renee Pain Protein Powder (vanilla - qty 1) Lot #: [id number] Exp: 01/2015  The following the learning objectives were met by the patient during this course:  Identify Pre-Op Dietary Goals and will begin 2 weeks pre-operatively  Identify appropriate sources of fluids and proteins   State protein recommendations and appropriate sources pre and post-operatively  Identify Post-Operative Dietary Goals and will follow for 2 weeks post-operatively  Identify appropriate multivitamin and calcium sources  Describe the need for physical activity post-operatively and will follow MD recommendations  State when to call healthcare provider regarding medication questions or post-operative complications  Handouts given during class include:  Pre-Op Bariatric Surgery Diet Handout  Protein Shake Handout  Post-Op Bariatric Surgery Nutrition Handout  BELT Program Information Flyer  Support Group Information Flyer  [facility] Bariatric Supplements Price List  Follow-Up Plan: Patient will follow-up at [facility] 2 weeks post operatively for diet advancement per MD.

## 2013-10-22 NOTE — Progress Notes (Signed)
Sleep study 08-06-13 epic ekg 05-15-13 epic  chest xray 05-15-13 epic

## 2013-10-23 ENCOUNTER — Encounter (HOSPITAL_COMMUNITY)
Admission: RE | Admit: 2013-10-23 | Discharge: 2013-10-23 | Disposition: A | Discharge status: Home / Self Care | Payer: BC Managed Care – PPO | Source: Ambulatory Visit | Attending: Surgery | Admitting: Surgery

## 2013-10-23 ENCOUNTER — Encounter (HOSPITAL_COMMUNITY): Payer: Self-pay

## 2013-10-23 DIAGNOSIS — Z01812 Encounter for preprocedural laboratory examination: Secondary | ICD-10-CM | POA: Insufficient documentation

## 2013-10-23 DIAGNOSIS — Z01818 Encounter for other preprocedural examination: Secondary | ICD-10-CM | POA: Insufficient documentation

## 2013-10-23 HISTORY — DX: Sleep apnea, unspecified: G47.30

## 2013-10-23 HISTORY — DX: Other specified postprocedural states: Z98.890

## 2013-10-23 HISTORY — DX: Headache: R51

## 2013-10-23 HISTORY — DX: Nausea with vomiting, unspecified: R11.2

## 2013-10-23 LAB — COMPREHENSIVE METABOLIC PANEL
ALT: 21 U/L (ref 0–35)
ANION GAP: 12 (ref 5–15)
AST: 26 U/L (ref 0–37)
Albumin: 4.1 g/dL (ref 3.5–5.2)
Alkaline Phosphatase: 66 U/L (ref 39–117)
BILIRUBIN TOTAL: 0.4 mg/dL (ref 0.3–1.2)
BUN: 15 mg/dL (ref 6–23)
CHLORIDE: 102 meq/L (ref 96–112)
CO2: 25 meq/L (ref 19–32)
Calcium: 9.7 mg/dL (ref 8.4–10.5)
Creatinine, Ser: 0.93 mg/dL (ref 0.50–1.10)
GFR calc Af Amer: 89 mL/min — ABNORMAL LOW (ref >90)
GFR, EST NON AFRICAN AMERICAN: 76 mL/min — AB (ref >90)
Glucose, Bld: 85 mg/dL (ref 70–99)
Potassium: 4.2 mEq/L (ref 3.7–5.3)
Sodium: 139 mEq/L (ref 137–147)
Total Protein: 7.5 g/dL (ref 6.0–8.3)

## 2013-10-23 LAB — CBC WITH DIFFERENTIAL/PLATELET
BASOS PCT: 0 % (ref 0–1)
Basophils Absolute: 0 10*3/uL (ref 0.0–0.1)
Eosinophils Absolute: 0.1 10*3/uL (ref 0.0–0.7)
Eosinophils Relative: 1 % (ref 0–5)
HEMATOCRIT: 39.2 % (ref 36.0–46.0)
HEMOGLOBIN: 13 g/dL (ref 12.0–15.0)
LYMPHS ABS: 2.7 10*3/uL (ref 0.7–4.0)
LYMPHS PCT: 34 % (ref 12–46)
MCH: 32.3 pg (ref 26.0–34.0)
MCHC: 33.2 g/dL (ref 30.0–36.0)
MCV: 97.5 fL (ref 78.0–100.0)
MONO ABS: 0.7 10*3/uL (ref 0.1–1.0)
MONOS PCT: 8 % (ref 3–12)
NEUTROS ABS: 4.5 10*3/uL (ref 1.7–7.7)
Neutrophils Relative %: 57 % (ref 43–77)
Platelets: 248 10*3/uL (ref 150–400)
RBC: 4.02 MIL/uL (ref 3.87–5.11)
RDW: 12.2 % (ref 11.5–15.5)
WBC: 8 10*3/uL (ref 4.0–10.5)

## 2013-10-23 NOTE — Patient Instructions (Addendum)
[place]  10/23/2013   Your procedure is scheduled on: Tuesday October 29, 2013  Report to [facility] Main Entrance and follow signs to  [place] at 515 AM.  Call this number if you have problems the morning of surgery [phone]   Remember: remove your right eyebrow piering, call dr Hassell Done and check on bowel pre [phone] phone  Do not eat food or drink liquids :After Midnight.     Take these medicines the morning of surgery with A SIP OF WATER: albuterol nebulizer, inhaler if needed and bring inhaler and leave with your mother, nasonex nasal spray                               You may not have any metal on your body including hair pins and piercings  Do not wear jewelry, make-up, lotions, powders, or deodorant.   Men may shave face and neck.  Do not bring valuables to the hospital. [place].  Contacts, dentures or bridgework may not be worn into surgery.  Leave suitcase in the car. After surgery it may be brought to your room.  For patients admitted to the hospital, checkout time is 11:00 AM the day of discharge.   Patients discharged the day of surgery will not be allowed to drive home.  Name and phone number of your driver:  Special Instructions: [name] ________________________________________________________________________           [facility] - Preparing for Surgery Before surgery, you can play an important role.  Because skin is not sterile, your skin needs to be as free of germs as possible.  You can reduce the number of germs on your skin by washing with CHG (chlorahexidine gluconate) soap before surgery.  CHG is an antiseptic cleaner which kills germs and bonds with the skin to continue killing germs even after washing. Please DO NOT use if you have an allergy to CHG or antibacterial soaps.  If your skin becomes reddened/irritated stop using the CHG and inform your nurse when you arrive at Short Stay. Do not  shave (including legs and underarms) for at least 48 hours prior to the first CHG shower.  You may shave your face/neck. Please follow these instructions carefully:  1.  Shower with CHG Soap the night before surgery and the  morning of Surgery.  2.  If you choose to wash your hair, wash your hair first as usual with your  normal  shampoo.  3.  After you shampoo, rinse your hair and body thoroughly to remove the  shampoo.                           4.  Use CHG as you would any other liquid soap.  You can apply chg directly  to the skin and wash                       Gently with a scrungie or clean washcloth.  5.  Apply the CHG Soap to your body ONLY FROM THE NECK DOWN.   Do not use on face/ open                           Wound or open sores. Avoid contact with eyes, ears mouth and genitals (private parts).  Wash face,  Genitals (private parts) with your normal soap.             6.  Wash thoroughly, paying special attention to the area where your surgery  will be performed.  7.  Thoroughly rinse your body with warm water from the neck down.  8.  DO NOT shower/wash with your normal soap after using and rinsing off  the CHG Soap.                9.  Pat yourself dry with a clean towel.            10.  Wear clean pajamas.            11.  Place clean sheets on your bed the night of your first shower and do not  sleep with pets. Day of Surgery : Do not apply any lotions/deodorants the morning of surgery.  Please wear clean clothes to the hospital/surgery center.  FAILURE TO FOLLOW THESE INSTRUCTIONS MAY RESULT IN THE CANCELLATION OF YOUR SURGERY PATIENT SIGNATURE_________________________________  NURSE SIGNATURE__________________________________  ________________________________________________________________________

## 2013-10-24 ENCOUNTER — Encounter (HOSPITAL_COMMUNITY): Payer: Self-pay | Admitting: Pharmacy Technician

## 2013-10-29 ENCOUNTER — Inpatient Hospital Stay (HOSPITAL_COMMUNITY): Payer: BC Managed Care – PPO

## 2013-10-29 ENCOUNTER — Encounter (HOSPITAL_COMMUNITY): Payer: BC Managed Care – PPO | Admitting: *Deleted

## 2013-10-29 ENCOUNTER — Encounter (HOSPITAL_COMMUNITY): Payer: Self-pay | Admitting: *Deleted

## 2013-10-29 ENCOUNTER — Encounter (HOSPITAL_COMMUNITY)
Admission: RE | Disposition: A | Discharge status: Home / Self Care | Payer: Self-pay | Source: Ambulatory Visit | Attending: Surgery

## 2013-10-29 ENCOUNTER — Inpatient Hospital Stay (HOSPITAL_COMMUNITY): Payer: BC Managed Care – PPO | Admitting: *Deleted

## 2013-10-29 ENCOUNTER — Inpatient Hospital Stay (HOSPITAL_COMMUNITY)
Admission: RE | Admit: 2013-10-29 | Discharge: 2013-10-31 | DRG: 621 | Disposition: A | Discharge status: Home / Self Care | Payer: BC Managed Care – PPO | Source: Ambulatory Visit | Attending: Surgery | Admitting: Surgery

## 2013-10-29 DIAGNOSIS — K209 Esophagitis, unspecified without bleeding: Secondary | ICD-10-CM | POA: Diagnosis present

## 2013-10-29 DIAGNOSIS — Z6841 Body Mass Index (BMI) 40.0 and over, adult: Secondary | ICD-10-CM | POA: Diagnosis present

## 2013-10-29 DIAGNOSIS — K449 Diaphragmatic hernia without obstruction or gangrene: Secondary | ICD-10-CM | POA: Diagnosis present

## 2013-10-29 DIAGNOSIS — Z9884 Bariatric surgery status: Secondary | ICD-10-CM

## 2013-10-29 DIAGNOSIS — Z79899 Other long term (current) drug therapy: Secondary | ICD-10-CM

## 2013-10-29 DIAGNOSIS — Z833 Family history of diabetes mellitus: Secondary | ICD-10-CM

## 2013-10-29 DIAGNOSIS — E66813 Obesity, class 3: Secondary | ICD-10-CM | POA: Diagnosis present

## 2013-10-29 DIAGNOSIS — M129 Arthropathy, unspecified: Secondary | ICD-10-CM

## 2013-10-29 DIAGNOSIS — J45909 Unspecified asthma, uncomplicated: Secondary | ICD-10-CM

## 2013-10-29 DIAGNOSIS — K219 Gastro-esophageal reflux disease without esophagitis: Secondary | ICD-10-CM | POA: Diagnosis present

## 2013-10-29 DIAGNOSIS — R110 Nausea: Secondary | ICD-10-CM | POA: Diagnosis not present

## 2013-10-29 DIAGNOSIS — Z01812 Encounter for preprocedural laboratory examination: Secondary | ICD-10-CM

## 2013-10-29 DIAGNOSIS — Z836 Family history of other diseases of the respiratory system: Secondary | ICD-10-CM

## 2013-10-29 HISTORY — PX: LAPAROSCOPIC GASTRIC SLEEVE RESECTION: SHX5895

## 2013-10-29 HISTORY — PX: UPPER GI ENDOSCOPY: SHX6162

## 2013-10-29 LAB — CREATININE, SERUM
Creatinine, Ser: 0.83 mg/dL (ref 0.50–1.10)
GFR calc non Af Amer: 88 mL/min — ABNORMAL LOW (ref >90)

## 2013-10-29 LAB — CBC
HEMATOCRIT: 41.7 % (ref 36.0–46.0)
Hemoglobin: 13.6 g/dL (ref 12.0–15.0)
MCH: 31.9 pg (ref 26.0–34.0)
MCHC: 32.6 g/dL (ref 30.0–36.0)
MCV: 97.7 fL (ref 78.0–100.0)
Platelets: 258 10*3/uL (ref 150–400)
RBC: 4.27 MIL/uL (ref 3.87–5.11)
RDW: 12.3 % (ref 11.5–15.5)
WBC: 8.8 10*3/uL (ref 4.0–10.5)

## 2013-10-29 LAB — HEMOGLOBIN AND HEMATOCRIT, BLOOD
HCT: 42.1 % (ref 36.0–46.0)
Hemoglobin: 13.7 g/dL (ref 12.0–15.0)

## 2013-10-29 LAB — PREGNANCY, URINE: Preg Test, Ur: NEGATIVE

## 2013-10-29 SURGERY — GASTRECTOMY, SLEEVE, LAPAROSCOPIC
Anesthesia: General

## 2013-10-29 MED ORDER — ONDANSETRON HCL 4 MG/2ML IJ SOLN
INTRAMUSCULAR | Status: AC
  Filled 2013-10-29: qty 2

## 2013-10-29 MED ORDER — DIPHENHYDRAMINE HCL 50 MG/ML IJ SOLN
INTRAMUSCULAR | Status: AC
  Filled 2013-10-29: qty 1

## 2013-10-29 MED ORDER — ESMOLOL HCL 10 MG/ML IV SOLN
INTRAVENOUS | Status: DC | PRN
  Administered 2013-10-29: 30 mg via INTRAVENOUS
  Administered 2013-10-29: 20 mg via INTRAVENOUS

## 2013-10-29 MED ORDER — HEPARIN SODIUM (PORCINE) 5000 UNIT/ML IJ SOLN
5000.0000 [IU] | Freq: Three times a day (TID) | INTRAMUSCULAR | Status: DC
  Administered 2013-10-29 – 2013-10-31 (×5): 5000 [IU] via SUBCUTANEOUS
  Filled 2013-10-29 (×8): qty 1

## 2013-10-29 MED ORDER — ESMOLOL HCL 10 MG/ML IV SOLN
INTRAVENOUS | Status: AC
  Filled 2013-10-29: qty 10

## 2013-10-29 MED ORDER — HYDRALAZINE HCL 20 MG/ML IJ SOLN
INTRAMUSCULAR | Status: DC | PRN
  Administered 2013-10-29: 2.5 mg via INTRAVENOUS
  Administered 2013-10-29: 5 mg via INTRAVENOUS

## 2013-10-29 MED ORDER — PROMETHAZINE HCL 25 MG/ML IJ SOLN
6.2500 mg | INTRAMUSCULAR | Status: AC | PRN
  Administered 2013-10-29 (×2): 12.5 mg via INTRAVENOUS

## 2013-10-29 MED ORDER — BUPIVACAINE LIPOSOME 1.3 % IJ SUSP
INTRAMUSCULAR | Status: DC | PRN
Start: 2013-10-29 — End: 2013-10-29
  Administered 2013-10-29: 20 mL

## 2013-10-29 MED ORDER — HYDRALAZINE HCL 20 MG/ML IJ SOLN
INTRAMUSCULAR | Status: AC
Start: 2013-10-29 — End: 2013-10-29
  Filled 2013-10-29: qty 1

## 2013-10-29 MED ORDER — ATROPINE SULFATE 0.4 MG/ML IJ SOLN
INTRAMUSCULAR | Status: AC
  Filled 2013-10-29: qty 1

## 2013-10-29 MED ORDER — OXYCODONE HCL 5 MG/5ML PO SOLN: 5.0000 mg | ORAL | Status: DC | PRN

## 2013-10-29 MED ORDER — HALOPERIDOL LACTATE 5 MG/ML IJ SOLN
1.0000 mg | INTRAMUSCULAR | Status: DC | PRN
  Filled 2013-10-29: qty 0.2

## 2013-10-29 MED ORDER — DIPHENHYDRAMINE HCL 50 MG/ML IJ SOLN
12.5000 mg | INTRAMUSCULAR | Status: DC
  Administered 2013-10-29: 12.5 mg via INTRAVENOUS

## 2013-10-29 MED ORDER — ONDANSETRON HCL 4 MG/2ML IJ SOLN
INTRAMUSCULAR | Status: DC | PRN
  Administered 2013-10-29: 4 mg via INTRAVENOUS

## 2013-10-29 MED ORDER — LIDOCAINE HCL (CARDIAC) 20 MG/ML IV SOLN
INTRAVENOUS | Status: DC | PRN
  Administered 2013-10-29: 50 mg via INTRAVENOUS

## 2013-10-29 MED ORDER — PROPOFOL 10 MG/ML IV BOLUS
INTRAVENOUS | Status: AC
  Filled 2013-10-29: qty 20

## 2013-10-29 MED ORDER — ALBUTEROL SULFATE (2.5 MG/3ML) 0.083% IN NEBU: 2.5000 mg | INHALATION_SOLUTION | Freq: Four times a day (QID) | RESPIRATORY_TRACT | Status: DC | PRN

## 2013-10-29 MED ORDER — DEXTROSE 5 % IV SOLN
INTRAVENOUS | Status: AC
  Filled 2013-10-29: qty 2

## 2013-10-29 MED ORDER — MEPERIDINE HCL 50 MG/ML IJ SOLN: 6.2500 mg | INTRAMUSCULAR | Status: DC | PRN

## 2013-10-29 MED ORDER — LACTATED RINGERS IV SOLN
INTRAVENOUS | Status: DC | PRN
  Administered 2013-10-29 (×2): via INTRAVENOUS

## 2013-10-29 MED ORDER — 0.9 % SODIUM CHLORIDE (POUR BTL) OPTIME
TOPICAL | Status: DC | PRN
  Administered 2013-10-29: 1000 mL

## 2013-10-29 MED ORDER — NEOSTIGMINE METHYLSULFATE 10 MG/10ML IV SOLN
INTRAVENOUS | Status: DC | PRN
  Administered 2013-10-29: 5 mg via INTRAVENOUS

## 2013-10-29 MED ORDER — ACETAMINOPHEN 160 MG/5ML PO SOLN: 650.0000 mg | ORAL | Status: DC | PRN

## 2013-10-29 MED ORDER — HEPARIN SODIUM (PORCINE) 5000 UNIT/ML IJ SOLN
5000.0000 [IU] | INTRAMUSCULAR | Status: AC
  Administered 2013-10-29: 5000 [IU] via SUBCUTANEOUS
  Filled 2013-10-29: qty 1

## 2013-10-29 MED ORDER — GLYCOPYRROLATE 0.2 MG/ML IJ SOLN
INTRAMUSCULAR | Status: AC
  Filled 2013-10-29: qty 4

## 2013-10-29 MED ORDER — FENTANYL CITRATE 0.05 MG/ML IJ SOLN
INTRAMUSCULAR | Status: AC
  Filled 2013-10-29: qty 5

## 2013-10-29 MED ORDER — FENTANYL CITRATE 0.05 MG/ML IJ SOLN
INTRAMUSCULAR | Status: DC | PRN
  Administered 2013-10-29 (×2): 50 ug via INTRAVENOUS
  Administered 2013-10-29: 100 ug via INTRAVENOUS
  Administered 2013-10-29 (×2): 50 ug via INTRAVENOUS
  Administered 2013-10-29: 100 ug via INTRAVENOUS
  Administered 2013-10-29 (×4): 50 ug via INTRAVENOUS

## 2013-10-29 MED ORDER — LACTATED RINGERS IR SOLN
Status: DC | PRN
  Administered 2013-10-29: 3000 mL

## 2013-10-29 MED ORDER — LIDOCAINE HCL (CARDIAC) 20 MG/ML IV SOLN
INTRAVENOUS | Status: AC
  Filled 2013-10-29: qty 5

## 2013-10-29 MED ORDER — MIDAZOLAM HCL 5 MG/5ML IJ SOLN
INTRAMUSCULAR | Status: DC | PRN
  Administered 2013-10-29: 2 mg via INTRAVENOUS

## 2013-10-29 MED ORDER — LACTATED RINGERS IV SOLN
INTRAVENOUS | Status: DC
  Administered 2013-10-29: 10:00:00 via INTRAVENOUS

## 2013-10-29 MED ORDER — HYDROMORPHONE HCL PF 1 MG/ML IJ SOLN
0.2500 mg | INTRAMUSCULAR | Status: DC | PRN
  Administered 2013-10-29: 0.5 mg via INTRAVENOUS

## 2013-10-29 MED ORDER — ROCURONIUM BROMIDE 100 MG/10ML IV SOLN
INTRAVENOUS | Status: DC | PRN
  Administered 2013-10-29: 15 mg via INTRAVENOUS
  Administered 2013-10-29: 50 mg via INTRAVENOUS
  Administered 2013-10-29: 10 mg via INTRAVENOUS

## 2013-10-29 MED ORDER — UNJURY CHOCOLATE CLASSIC POWDER
2.0000 [oz_av] | Freq: Four times a day (QID) | ORAL | Status: DC
  Administered 2013-10-31: 2 [oz_av] via ORAL

## 2013-10-29 MED ORDER — GLYCOPYRROLATE 0.2 MG/ML IJ SOLN
INTRAMUSCULAR | Status: DC | PRN
  Administered 2013-10-29: .8 mg via INTRAVENOUS
  Administered 2013-10-29: 0.2 mg via INTRAVENOUS

## 2013-10-29 MED ORDER — ACETAMINOPHEN 160 MG/5ML PO SOLN: 325.0000 mg | ORAL | Status: DC | PRN

## 2013-10-29 MED ORDER — METOCLOPRAMIDE HCL 5 MG/ML IJ SOLN
INTRAMUSCULAR | Status: DC | PRN
  Administered 2013-10-29: 10 mg via INTRAVENOUS

## 2013-10-29 MED ORDER — FENTANYL CITRATE 0.05 MG/ML IJ SOLN
INTRAMUSCULAR | Status: AC
  Filled 2013-10-29: qty 2

## 2013-10-29 MED ORDER — MIDAZOLAM HCL 2 MG/2ML IJ SOLN
INTRAMUSCULAR | Status: AC
  Filled 2013-10-29: qty 2

## 2013-10-29 MED ORDER — ONDANSETRON HCL 4 MG/2ML IJ SOLN
4.0000 mg | INTRAMUSCULAR | Status: DC | PRN
  Administered 2013-10-29 – 2013-10-30 (×5): 4 mg via INTRAVENOUS
  Filled 2013-10-29 (×5): qty 2

## 2013-10-29 MED ORDER — SCOPOLAMINE 1 MG/3DAYS TD PT72
MEDICATED_PATCH | TRANSDERMAL | Status: AC
  Filled 2013-10-29: qty 1

## 2013-10-29 MED ORDER — NEOSTIGMINE METHYLSULFATE 10 MG/10ML IV SOLN
INTRAVENOUS | Status: AC
  Filled 2013-10-29: qty 1

## 2013-10-29 MED ORDER — BUPIVACAINE LIPOSOME 1.3 % IJ SUSP
20.0000 mL | Freq: Once | INTRAMUSCULAR | Status: DC
  Filled 2013-10-29: qty 20

## 2013-10-29 MED ORDER — MONTELUKAST SODIUM 10 MG PO TABS
10.0000 mg | ORAL_TABLET | Freq: Every day | ORAL | Status: DC
  Administered 2013-10-31: 10 mg via ORAL
  Filled 2013-10-29 (×2): qty 1

## 2013-10-29 MED ORDER — DEXTROSE 5 % IV SOLN
2.0000 g | INTRAVENOUS | Status: AC
  Administered 2013-10-29: 2 g via INTRAVENOUS

## 2013-10-29 MED ORDER — PROPOFOL 10 MG/ML IV BOLUS
INTRAVENOUS | Status: DC | PRN
  Administered 2013-10-29: 220 mg via INTRAVENOUS

## 2013-10-29 MED ORDER — CHLORHEXIDINE GLUCONATE CLOTH 2 % EX PADS: 6.0000 | MEDICATED_PAD | Freq: Once | CUTANEOUS | Status: DC

## 2013-10-29 MED ORDER — PROMETHAZINE HCL 25 MG/ML IJ SOLN
INTRAMUSCULAR | Status: AC
  Filled 2013-10-29: qty 1

## 2013-10-29 MED ORDER — SCOPOLAMINE 1 MG/3DAYS TD PT72
MEDICATED_PATCH | TRANSDERMAL | Status: DC | PRN
  Administered 2013-10-29: 1 via TRANSDERMAL

## 2013-10-29 MED ORDER — HYDROMORPHONE HCL PF 1 MG/ML IJ SOLN
INTRAMUSCULAR | Status: AC
  Filled 2013-10-29: qty 1

## 2013-10-29 MED ORDER — MORPHINE SULFATE 2 MG/ML IJ SOLN
2.0000 mg | INTRAMUSCULAR | Status: DC | PRN
  Administered 2013-10-29: 2 mg via INTRAVENOUS
  Filled 2013-10-29: qty 1

## 2013-10-29 MED ORDER — DEXAMETHASONE SODIUM PHOSPHATE 10 MG/ML IJ SOLN
INTRAMUSCULAR | Status: DC | PRN
  Administered 2013-10-29: 5 mg via INTRAVENOUS

## 2013-10-29 MED ORDER — BIOTENE DRY MOUTH MT LIQD
15.0000 mL | Freq: Two times a day (BID) | OROMUCOSAL | Status: DC
  Administered 2013-10-29 – 2013-10-31 (×3): 15 mL via OROMUCOSAL

## 2013-10-29 MED ORDER — UNJURY CHICKEN SOUP POWDER: 2.0000 [oz_av] | Freq: Four times a day (QID) | ORAL | Status: DC

## 2013-10-29 MED ORDER — ROCURONIUM BROMIDE 100 MG/10ML IV SOLN
INTRAVENOUS | Status: AC
  Filled 2013-10-29: qty 1

## 2013-10-29 MED ORDER — KCL IN DEXTROSE-NACL 20-5-0.45 MEQ/L-%-% IV SOLN
INTRAVENOUS | Status: DC
  Administered 2013-10-29 – 2013-10-30 (×4): via INTRAVENOUS
  Administered 2013-10-31: 100 mL via INTRAVENOUS
  Filled 2013-10-29 (×6): qty 1000

## 2013-10-29 MED ORDER — UNJURY VANILLA POWDER: 2.0000 [oz_av] | Freq: Four times a day (QID) | ORAL | Status: DC

## 2013-10-29 MED ORDER — KCL IN DEXTROSE-NACL 20-5-0.45 MEQ/L-%-% IV SOLN
INTRAVENOUS | Status: AC
  Filled 2013-10-29: qty 1000

## 2013-10-29 SURGICAL SUPPLY — 68 items
ADH SKN CLS APL DERMABOND .7 (GAUZE/BANDAGES/DRESSINGS) ×2
APL SRG 32X5 SNPLK LF DISP (MISCELLANEOUS)
APPLICATOR COTTON TIP 6IN STRL (MISCELLANEOUS) ×2 IMPLANT
APPLIER CLIP ROT 10 11.4 M/L (STAPLE)
APPLIER CLIP ROT 13.4 12 LRG (CLIP)
APR CLP LRG 13.4X12 ROT 20 MLT (CLIP)
APR CLP MED LRG 11.4X10 (STAPLE)
BAG SPEC RTRVL LRG 6X4 10 (ENDOMECHANICALS) ×2
BLADE HEX COATED 2.75 (ELECTRODE) IMPLANT
BLADE SURG 15 STRL LF DISP TIS (BLADE) ×2 IMPLANT
BLADE SURG 15 STRL SS (BLADE) ×3
CABLE HIGH FREQUENCY MONO STRZ (ELECTRODE) IMPLANT
CLIP APPLIE ROT 10 11.4 M/L (STAPLE) IMPLANT
CLIP APPLIE ROT 13.4 12 LRG (CLIP) IMPLANT
DERMABOND ADVANCED (GAUZE/BANDAGES/DRESSINGS) ×1
DERMABOND ADVANCED .7 DNX12 (GAUZE/BANDAGES/DRESSINGS) IMPLANT
DEVICE SUT QUICK LOAD TK 5 (STAPLE) ×2 IMPLANT
DEVICE SUT TI-KNOT TK 5X26 (MISCELLANEOUS) ×1 IMPLANT
DEVICE SUTURE ENDOST 10MM (ENDOMECHANICALS) ×1 IMPLANT
DEVICE TROCAR PUNCTURE CLOSURE (ENDOMECHANICALS) ×3 IMPLANT
DISSECTOR BLUNT TIP ENDO 5MM (MISCELLANEOUS) ×3 IMPLANT
DRAPE CAMERA CLOSED 9X96 (DRAPES) ×3 IMPLANT
ELECT REM PT RETURN 9FT ADLT (ELECTROSURGICAL) ×3
ELECTRODE REM PT RTRN 9FT ADLT (ELECTROSURGICAL) ×2 IMPLANT
GAUZE SPONGE 4X4 12PLY STRL (GAUZE/BANDAGES/DRESSINGS) IMPLANT
GLOVE BIOGEL M 8.0 STRL (GLOVE) ×3 IMPLANT
GOWN STRL REUS W/TWL XL LVL3 (GOWN DISPOSABLE) ×10 IMPLANT
HANDLE STAPLE EGIA 4 XL (STAPLE) ×3 IMPLANT
HOVERMATT SINGLE USE (MISCELLANEOUS) ×3 IMPLANT
KIT BASIN OR (CUSTOM PROCEDURE TRAY) ×3 IMPLANT
NDL SPNL 22GX3.5 QUINCKE BK (NEEDLE) ×2 IMPLANT
NEEDLE SPNL 22GX3.5 QUINCKE BK (NEEDLE) ×3 IMPLANT
PACK UNIVERSAL I (CUSTOM PROCEDURE TRAY) ×3 IMPLANT
PEN SKIN MARKING BROAD (MISCELLANEOUS) ×3 IMPLANT
POUCH SPECIMEN RETRIEVAL 10MM (ENDOMECHANICALS) ×1 IMPLANT
RELOAD ENDO STITCH (ENDOMECHANICALS) ×6 IMPLANT
RELOAD STAPLE 45 PURP MED/THCK (STAPLE) IMPLANT
RELOAD SUT TRIPLE-STITCH 2-0 (ENDOMECHANICALS) IMPLANT
RELOAD TRI 45 ART MED THCK BLK (STAPLE) ×4 IMPLANT
RELOAD TRI 45 ART MED THCK PUR (STAPLE) IMPLANT
RELOAD TRI 60 ART MED THCK BLK (STAPLE) ×2 IMPLANT
RELOAD TRI 60 ART MED THCK PUR (STAPLE) ×6 IMPLANT
SCISSORS LAP 5X45 EPIX DISP (ENDOMECHANICALS) ×1 IMPLANT
SCRUB PCMX 4 OZ (MISCELLANEOUS) ×5 IMPLANT
SEALANT SURGICAL APPL DUAL CAN (MISCELLANEOUS) IMPLANT
SET IRRIG TUBING LAPAROSCOPIC (IRRIGATION / IRRIGATOR) ×3 IMPLANT
SHEARS CURVED HARMONIC AC 45CM (MISCELLANEOUS) ×3 IMPLANT
SLEEVE ADV FIXATION 5X100MM (TROCAR) ×4 IMPLANT
SLEEVE ENDOPATH XCEL 5M (ENDOMECHANICALS) ×3 IMPLANT
SLEEVE GASTRECTOMY 36FR VISIGI (MISCELLANEOUS) ×3 IMPLANT
SOLUTION ANTI FOG 6CC (MISCELLANEOUS) ×3 IMPLANT
SPONGE LAP 18X18 X RAY DECT (DISPOSABLE) ×3 IMPLANT
STAPLER VISISTAT 35W (STAPLE) ×3 IMPLANT
SUT VIC AB 4-0 SH 18 (SUTURE) ×3 IMPLANT
SYRINGE 20CC LL (MISCELLANEOUS) ×3 IMPLANT
SYRINGE 60CC LL (MISCELLANEOUS) ×3 IMPLANT
TOWEL OR 17X26 10 PK STRL BLUE (TOWEL DISPOSABLE) ×6 IMPLANT
TOWEL OR NON WOVEN STRL DISP B (DISPOSABLE) ×3 IMPLANT
TRAY FOLEY CATH 14FRSI W/METER (CATHETERS) ×3 IMPLANT
TROCAR ADV FIXATION 12X100MM (TROCAR) ×2 IMPLANT
TROCAR ADV FIXATION 5X100MM (TROCAR) ×2 IMPLANT
TROCAR BLADELESS 15MM (ENDOMECHANICALS) ×2 IMPLANT
TROCAR BLADELESS OPT 5 100 (ENDOMECHANICALS) ×2 IMPLANT
TROCAR XCEL NON-BLD 5MMX100MML (ENDOMECHANICALS) ×1 IMPLANT
TUBE CALIBRATION LAPBAND (TUBING) IMPLANT
TUBING CONNECTING 10 (TUBING) ×3 IMPLANT
TUBING ENDO SMARTCAP (MISCELLANEOUS) ×3 IMPLANT
TUBING FILTER THERMOFLATOR (ELECTROSURGICAL) ×3 IMPLANT

## 2013-10-29 NOTE — Interval H&P Note (Signed)
History and Physical Interval Note:  10/29/2013 7:15 AM  [name] [name]  has presented today for surgery, with the diagnosis of morbid obesity   The various methods of treatment have been discussed with the patient and family. After consideration of risks, benefits and other options for treatment, the patient has consented to  Procedure(s): LAPAROSCOPIC GASTRIC SLEEVE RESECTION ([name]) as a surgical intervention .  The patient's history has been reviewed, patient examined, no change in status, stable for surgery.  I have reviewed the patient's chart and labs.  Questions were answered to the patient's satisfaction.     Chenay Nesmith B

## 2013-10-29 NOTE — Op Note (Signed)
Surgeon: Kaylyn Lim, MD, FACS  Asst:  Greer Pickerel, MD, FACS  Anes:  General endotracheal  Procedure: Laparoscopic sleeve gastrectomy and 2 suture posterior repair of the hiatal hernia  Diagnosis: Morbid obesity BMI 56 with GER and hiatus hernia  EBL:   15 cc  Description of Procedure:  The patient was take to OR 1 and given general anesthesia.  The abdomen was prepped with PCMX and draped sterilely.  A timeout was performed.  Access to the abdomen was achieved with through the left upper quadrant with initial placement of 5 mm Optiview trocar probably above the diaphragm.   Prior to repositioning the patient was given full ventilations with trocar to vent. After repositioning and following insufflation the patient had good ventilation and vital signs and the remaining trocars were placed including all 5 mm except for a 15 mm that was to the right of the midline inferiorally.    Following insufflation, the state of the abdomen was found to be without adhesions but a dimple was seen at the [place] juncition.  The balloon calibration tube was inserted and was positive for a sliding hiatus hernia.  A posterior dissection was performed and this showed evidence of distal esophagitis.  The ViSiGi 36 was passed after a two suture closure of the hiatus posteriorally.  This was done with the Endo Stitch and 0 Surgidec secured with Ty-Knots.   The pylorus was identified and we measured 5 cm back and marked the antrum.  At that point we began dissection to take down the greater curvature of the stomach using the Harmonic scalpel.  This dissection was taken all the way up to the left crus.  Posterior attachments of the stomach were also taken down.    The 36 Fr ViSiGi tube was then passed into the antrum and suction applied so that it was snug along the lessor curvature.  The "crow's foot" or incisura was identified.  The sleeve gastrectomy was begun using the [employer] stapler beginning with two firings of  the black with TRS.  This was followed with multiple applications of the 6 cm purple with TRS to complete the sleeve.    When the sleeve was complete the tube was taken off suction and insufflated briefly.  The tube was withdrawn.  Upper endoscopy was then performed by Dr. Redmond Pulling which showed no bleeding and no leaks or bubbles.     The specimen was extracted through the 15 trocar site and required a bag to remove.   Wounds were infiltrated with Exparel and closed with 4-0 vicryl and Dermabond.  The patient was taken to the PACU in stable condition.    [name] Done, [place], [facility], [place]

## 2013-10-29 NOTE — Addendum Note (Signed)
Addendum created 10/29/13 1110 by Nickie Retort, MD   Modules edited: Orders

## 2013-10-29 NOTE — Anesthesia Postprocedure Evaluation (Signed)
Anesthesia Post Note  Patient: [name] [name]  Procedure(s) Performed: Procedure(s) (LRB): LAPAROSCOPIC GASTRIC SLEEVE RESECTION AND REPAIR OF HIATAL HERNIA ([name]) UPPER GI ENDOSCOPY  Anesthesia type: General  Patient location: PACU  Post pain: Pain level controlled  Post assessment: Post-op Vital signs reviewed  Last Vitals: BP 113/82  Pulse 92  Temp(Src) 36.4 C (Oral)  Resp 19  Ht 5' (1.524 m)  Wt 270 lb 6 oz (122.641 kg)  BMI 52.80 kg/m2  SpO2 100%  LMP 10/29/2013  Post vital signs: Reviewed  Level of consciousness: sedated  Complications: PONV. No other apparent anesthesia complications

## 2013-10-29 NOTE — Op Note (Signed)
[name] [name] [id number] 05-Jan-1976 10/29/2013  Preoperative diagnosis: morbid obesity  Postoperative diagnosis: Same   Procedure: Esophagogastroduodenoscopy   Surgeon: Leighton Ruff. Hulan Szumski M.D., FACS   Anesthesia: Gen.   Indications for procedure: 38year old WF undergoing Laparoscopic Gastric Sleeve Resection and an EGD was requested to evaluate the new gastric sleeve.   Description of procedure: After we have completed the sleeve resection, I scrubbed out and obtained the Olympus endoscope. I gently placed endoscope in the patient's oropharynx and gently glided it down the esophagus without any difficulty under direct visualization. Once I was in the gastric sleeve, I insufflated the stomach with air. I was able to cannulate and advanced the scope through the gastric sleeve. I was able to cannulate the duodenum with ease. Dr. Hassell Done had placed saline in the upper abdomen. Upon further insufflation of the gastric sleeve there was no evidence of bubbles. GE junction located at 40 cm.  Upon further inspection of the gastric sleeve, the mucosa appeared normal. There is no evidence of any mucosal abnormality. The sleeve was widely patent at the angularis. There was no evidence of bleeding. The gastric sleeve was decompressed. The scope was withdrawn. The patient tolerated this portion of the procedure well. Please see Dr Earlie Server operative note for details regarding the laparoscopic gastric sleeve resection.   Leighton Ruff. Redmond Pulling, MD, FACS  General, Bariatric, & Minimally Invasive Surgery  [facility], [place]

## 2013-10-29 NOTE — Transfer of Care (Signed)
Immediate Anesthesia Transfer of Care Note  Patient: [name] [name]  Procedure(s) Performed: Procedure(s): LAPAROSCOPIC GASTRIC SLEEVE RESECTION AND REPAIR OF HIATAL HERNIA ([name]) UPPER GI ENDOSCOPY  Patient Location: PACU  Anesthesia Type:General  Level of Consciousness: awake, alert  and oriented  Airway & Oxygen Therapy: Patient Spontanous Breathing and Patient connected to face mask oxygen  Post-op Assessment: Report given to PACU RN, Post -op Vital signs reviewed and stable and Patient moving all extremities  Post vital signs: Reviewed and stable  Complications: No apparent anesthesia complications

## 2013-10-29 NOTE — H&P (View-Only) (Signed)
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  We explored her history and she does have problems with asthma in his recently had to take steroids for this. In addition she has arthritic pains and takes nonsteroidal anti-inflammatories. Those 2 historical facts led [place] to gravitate more toward her having a sleeve gastrectomy. She is excieted and ready to have surgery.  I answered the remainder of her questions.  Orders in EPIC  Her only prior abdominal surgery was a tubal ligation. She's otherwise been healthy.  Past Medical History  Diagnosis Date  . Allergy   . Asthma   . Arthritis   . Obesity     Past Surgical History  Procedure Laterality Date  . Knee arthroscopy    . Foot neuroma surgery    . Tubal ligation      Current Outpatient Prescriptions  Medication Sig Dispense Refill  . albuterol (PROVENTIL HFA;VENTOLIN HFA) 108 (90 BASE) MCG/ACT inhaler Inhale 1-2 puffs into the lungs every 6 (six) hours as needed for wheezing or shortness of breath.  1 Inhaler  0  . Antiseborrheic Products, Misc. (PROMISEB) CREA       . ASMANEX 60 METERED DOSES 220 MCG/INH inhaler       . clobetasol (TEMOVATE) 0.05 % external solution       . Fluticasone-Salmeterol (ADVAIR DISKUS) 100-50 MCG/DOSE AEPB Inhale 1 puff into the lungs 2 (two) times daily.  60 each  5  . mometasone (NASONEX) 50 MCG/ACT nasal spray Place 2 sprays into the nose daily.  17 g  12  . montelukast (SINGULAIR) 10 MG tablet Take 1 tablet (10 mg total) by mouth at bedtime.  30 tablet  5  . phenazopyridine (PYRIDIUM) 200 MG tablet Take 1 tablet (200 mg total) by mouth 3 (three) times daily as needed for pain.  10 tablet  0  . HYDROcodone-acetaminophen (HYCET) 7.5-325 mg/15 ml solution Take 10 mLs by mouth 4 (four)  times daily as needed for moderate pain.  300 mL  0   Current Facility-Administered Medications  Medication Dose Route Frequency Provider Last Rate Last Dose  . albuterol (PROVENTIL) (2.5 MG/3ML) 0.083% nebulizer solution 2.5 mg  2.5 mg Nebulization Once Thao P Le, DO       Other; Amoxicillin; Codeine; and Tramadol Family History  Problem Relation Age of Onset  . Lung disease Father   . Pulmonary embolism Father   . COPD Other   . Diabetes Other    Social History:   reports that she has never smoked. She has never used smokeless tobacco. She reports that she drinks alcohol. She reports that she does not use illicit drugs.   REVIEW OF SYSTEMS - PERTINENT POSITIVES ONLY: Negative for DVT. She does have asthma which has been getting worse with her weight. She denies reflux.  Physical Exam:   Blood pressure 126/76, pulse 88, temperature 98 F (36.7 C), resp. rate 16, height 5' (1.524 m), weight 287 lb (130.182 kg). Body mass index is 56.05 kg/(m^2).  Gen:  WDWN white female NAD  Neurological: Alert and oriented to person, place, and time. Motor and sensory function is grossly intact  Head: Normocephalic and atraumatic.  Eyes: Conjunctivae are normal. Pupils are equal, round, and reactive to light. No  scleral icterus.  Neck: Normal range of motion. Neck supple. No tracheal deviation or thyromegaly present.  Cardiovascular:  SR without murmurs or gallops.  No carotid bruits Respiratory: Effort normal.  No respiratory distress. No chest wall tenderness. Breath sounds normal.  No wheezes, rales or rhonchi.  Abdomen:  Obese nontender. GU: Musculoskeletal: Normal range of motion. Extremities are nontender. No cyanosis, edema or clubbing noted Lymphadenopathy: No cervical, preauricular, postauricular or axillary adenopathy is present Skin: Skin is warm and dry. No rash noted. No diaphoresis. No erythema. No pallor. Pscyh: Normal mood and affect. Behavior is normal. Judgment and thought  content normal.   LABORATORY RESULTS: No results found for this or any previous visit (from the past 48 hour(s)).  RADIOLOGY RESULTS: No results found.  Problem List: Patient Active Problem List   Diagnosis Date Noted  . Gallstones 10/15/2013  . Morbid obesity 09/21/2012  . Asthma 05/14/2011    Assessment & Plan: Morbid obesity BMI 55. Plan sleeve gastrectomy.  Orders in EPIC.  Discussed observation of GB (has stones).  UGI showed some reflux on siphon but she denies any symptoms of GERD.      [name] Done, MD, [facility] [phone] beeper [phone]  10/15/2013 9:45 AM

## 2013-10-29 NOTE — Anesthesia Preprocedure Evaluation (Addendum)
Anesthesia Evaluation  Patient identified by MRN, date of birth, ID band Patient awake    Reviewed: Allergy & Precautions, H&P , NPO status , Patient's Chart, lab work & pertinent test results  History of Anesthesia Complications (+) PONV and history of anesthetic complications  Airway Mallampati: III TM Distance: >3 FB Neck ROM: Full    Dental no notable dental hx.    Pulmonary asthma , sleep apnea ,  breath sounds clear to auscultation  Pulmonary exam normal       Cardiovascular negative cardio ROS  Rhythm:Regular Rate:Normal     Neuro/Psych  Headaches, negative psych ROS   GI/Hepatic negative GI ROS, Neg liver ROS,   Endo/Other  Morbid obesity  Renal/GU negative Renal ROS     Musculoskeletal negative musculoskeletal ROS (+)   Abdominal (+) + obese,   Peds  Hematology negative hematology ROS (+)   Anesthesia Other Findings   Reproductive/Obstetrics negative OB ROS                          Anesthesia Physical Anesthesia Plan  ASA: III  Anesthesia Plan: General   Post-op Pain Management:    Induction: Intravenous  Airway Management Planned:   Additional Equipment:   Intra-op Plan:   Post-operative Plan: Extubation in OR  Informed Consent: I have reviewed the patients History and Physical, chart, labs and discussed the procedure including the risks, benefits and alternatives for the proposed anesthesia with the patient or authorized representative who has indicated his/her understanding and acceptance.   Dental advisory given  Plan Discussed with: CRNA  Anesthesia Plan Comments:         Anesthesia Quick Evaluation

## 2013-10-30 ENCOUNTER — Inpatient Hospital Stay (HOSPITAL_COMMUNITY): Payer: BC Managed Care – PPO

## 2013-10-30 ENCOUNTER — Encounter (HOSPITAL_COMMUNITY): Payer: Self-pay | Admitting: Surgery

## 2013-10-30 DIAGNOSIS — Z09 Encounter for follow-up examination after completed treatment for conditions other than malignant neoplasm: Secondary | ICD-10-CM

## 2013-10-30 LAB — CBC WITH DIFFERENTIAL/PLATELET
BASOS PCT: 0 % (ref 0–1)
Basophils Absolute: 0 10*3/uL (ref 0.0–0.1)
EOS PCT: 0 % (ref 0–5)
Eosinophils Absolute: 0 10*3/uL (ref 0.0–0.7)
HEMATOCRIT: 40.2 % (ref 36.0–46.0)
HEMOGLOBIN: 13.2 g/dL (ref 12.0–15.0)
LYMPHS ABS: 1.6 10*3/uL (ref 0.7–4.0)
LYMPHS PCT: 13 % (ref 12–46)
MCH: 32.2 pg (ref 26.0–34.0)
MCHC: 32.8 g/dL (ref 30.0–36.0)
MCV: 98 fL (ref 78.0–100.0)
MONOS PCT: 8 % (ref 3–12)
Monocytes Absolute: 1 10*3/uL (ref 0.1–1.0)
NEUTROS ABS: 9.6 10*3/uL — AB (ref 1.7–7.7)
Neutrophils Relative %: 79 % — ABNORMAL HIGH (ref 43–77)
Platelets: 227 10*3/uL (ref 150–400)
RBC: 4.1 MIL/uL (ref 3.87–5.11)
RDW: 12.4 % (ref 11.5–15.5)
WBC: 12.2 10*3/uL — AB (ref 4.0–10.5)

## 2013-10-30 LAB — HEMOGLOBIN AND HEMATOCRIT, BLOOD
HEMATOCRIT: 41.8 % (ref 36.0–46.0)
Hemoglobin: 13.7 g/dL (ref 12.0–15.0)

## 2013-10-30 MED ORDER — PROMETHAZINE HCL 25 MG/ML IJ SOLN
12.5000 mg | Freq: Once | INTRAMUSCULAR | Status: AC
  Administered 2013-10-30: 12.5 mg via INTRAVENOUS
  Filled 2013-10-30: qty 1

## 2013-10-30 MED ORDER — PROMETHAZINE HCL 25 MG/ML IJ SOLN: 12.5000 mg | Freq: Once | INTRAMUSCULAR | Status: DC

## 2013-10-30 MED ORDER — IOHEXOL 300 MG/ML  SOLN
50.0000 mL | Freq: Once | INTRAMUSCULAR | Status: AC | PRN
  Administered 2013-10-30: 50 mL via ORAL

## 2013-10-30 NOTE — Care Management Note (Signed)
    Page 1 of 1   10/30/2013     11:13:45 AM CARE MANAGEMENT NOTE 10/30/2013  Patient:  [name] [name], [name]   Account Number:  [id number]  Date Initiated:  10/30/2013  Documentation initiated by:  Sunday Spillers  Subjective/Objective Assessment:   38 yo female admitted s/p Lap sleeve gastrectomy and repair of the hiatal hernia. PTA lived at home with parent.     Action/Plan:   Home when stable   Anticipated DC Date:  11/01/2013   Anticipated DC Plan:  [place]  CM consult      Choice offered to / List presented to:             Status of service:  Completed, signed off Medicare Important Message given?   (If response is "NO", the following Medicare IM given date fields will be blank) Date Medicare IM given:   Medicare IM given by:   Date Additional Medicare IM given:   Additional Medicare IM given by:    Discharge Disposition:  HOME/SELF CARE  Per UR Regulation:  Reviewed for med. necessity/level of care/duration of stay  If discussed at [place] of Stay Meetings, dates discussed:    Comments:

## 2013-10-30 NOTE — Progress Notes (Signed)
Patient is very nauseous, zofran given around 6 am not effective, paged MD, awaiting callback Neta Mends RN 10-30-2013 7:55am

## 2013-10-30 NOTE — Progress Notes (Signed)
*  PRELIMINARY RESULTS* Vascular Ultrasound Lower extremity venous duplex has been completed.  Preliminary findings: no evidence of DVT in visualized veins.  Landry Mellow, RDMS, RVT  10/30/2013, 11:04 AM

## 2013-10-30 NOTE — Progress Notes (Addendum)
Telephone order for phenergan 12.5mg  IV push once, also order given that Phenergan 12.5 mg can be given again iv push once again, order entered Neta Mends RN 10-30-2013 8:08am

## 2013-10-30 NOTE — Progress Notes (Signed)
Patient alert and oriented, Post op day 1.  Provided support and encouragement.  Encouraged pulmonary toilet, ambulation and wearing SDCs. Patient has been advanced to POD #1 diet (water), however, I instructed her to hold off on drinking due to her nausea and dry heaves. Patient agreeable. Provided cool compresses. Patient is receiving anti-emetics.  All questions answered.  Provided copy of bariatric discharge instructions for patient to review later. Will continue to monitor.   GASTRIC BYPASS / [place] Instructions  These instructions are to help you care for yourself when you go home.  Call: If you have any problems.   Call [phone] and ask for the surgeon on call   If you need immediate assistance come to the ER at [facility]. Tell the ER staff that you are a new post-op gastric bypass or gastric sleeve patient   Signs and symptoms to report:   Severe vomiting or nausea o If you cannot handle clear liquids for longer than 1 day, call your surgeon    Abdominal pain which does not get better after taking your pain medication   Fever greater than 100.4 F and chills   Heart rate over 100 beats a minute   Trouble breathing   Chest pain    Redness, swelling, drainage, or foul odor at incision (surgical) sites    If your incisions open or pull apart   Swelling or pain in calf (lower leg)   Diarrhea (Loose bowel movements that happen often), frequent watery, uncontrolled bowel movements   Constipation, (no bowel movements for 3 days) if this happens:  o Take Milk of Magnesia, 2 tablespoons by mouth, 3 times a day for 2 days if needed o Stop taking Milk of Magnesia once you have had a bowel movement o Call your doctor if constipation continues Or o Take Miralax  (instead of Milk of Magnesia) following the label instructions o Stop taking Miralax once you have had a bowel movement o Call your doctor if constipation continues   Anything you think is "abnormal for you"    Normal side effects after surgery:   Unable to sleep at night or unable to concentrate   Irritability   Being tearful (crying) or depressed These are common complaints, possibly related to your anesthesia, stress of surgery and change in lifestyle, that usually go away a few weeks after surgery.  If these feelings continue, call your medical doctor.  Wound Care: You may have surgical glue, steri-strips, or staples over your incisions after surgery   Surgical glue:  Looks like a clear film over your incisions and will wear off a little at a time   Steri-strips : Adhesive strips of tape over your incisions. You may notice a yellowish color on the skin under the steri-strips. This is used to make the   steri-strips stick better. Do not pull the steri-strips off - let them fall off   Staples: Staples may be removed before you leave the hospital o If you go home with staples, call [facility] at for an appointment with your surgeon's nurse to have staples removed 10 days after surgery, [phone]   Showering: You may shower two (2) days after your surgery unless your surgeon tells you differently o Wash gently around incisions with warm soapy water, rinse well, and gently pat dry  o If you have a drain (tube from your incision), you may need someone to hold this while you shower  o No tub baths  until staples are removed and incisions are healed     Medications:   Medications should be liquid or crushed if larger than the size of a dime   Extended release pills (medication that releases a little bit at a time through the day) should not be crushed   Depending on the size and number of medications you take, you may need to space (take a few throughout the day)/change the time you take your medications so that you do not over-fill your pouch (smaller stomach)   Make sure you follow-up with your primary care physician to make medication changes needed during rapid weight loss and  life-style changes   If you have diabetes, follow up with the doctor that orders your diabetes medication(s) within one week after surgery and check your blood sugar regularly.   Do not drive while taking narcotics (pain medications)   Do not take acetaminophen (Tylenol) and Roxicet or Lortab Elixir at the same time since these pain medications contain acetaminophen  Diet:                    First 2 Weeks  You will see the nutritionist about two (2) weeks after your surgery. The nutritionist will increase the types of foods you can eat if you are handling liquids well:   If you have severe vomiting or nausea and cannot handle clear liquids lasting longer than 1 day, call your surgeon  Protein Shake   Drink at least 2 ounces of shake 5-6 times per day   Each serving of protein shakes (usually 8 - 12 ounces) should have a minimum of:  o 15 grams of protein  o And no more than 5 grams of carbohydrate    Goal for protein each day: o Men = 80 grams per day o Women = 60 grams per day   Protein powder may be added to fluids such as non-fat milk or Lactaid milk or Soy milk (limit to 35 grams added protein powder per serving)  Hydration   Slowly increase the amount of water and other clear liquids as tolerated (See Acceptable Fluids)   Slowly increase the amount of protein shake as tolerated     Sip fluids slowly and throughout the day   May use sugar substitutes in small amounts (no more than 6 - 8 packets per day; i.e. Splenda)  Fluid Goal   The first goal is to drink at least 8 ounces of protein shake/drink per day (or as directed by the nutritionist); some examples of protein shakes are [employer], [employer], EAS Edge HP, and Unjury. See handout from pre-op Bariatric Education Class: o Slowly increase the amount of protein shake you drink as tolerated o You may find it easier to slowly sip shakes throughout the day o It is important to get your proteins in first   Your fluid goal is  to drink 64 - 100 ounces of fluid daily o It may take a few weeks to build up to this   32 oz (or more) should be clear liquids  And    32 oz (or more) should be full liquids (see below for examples)   Liquids should not contain sugar, caffeine, or carbonation  Clear Liquids:   Water or Sugar-free flavored water (i.e. Fruit H2O, Propel)   Decaffeinated coffee or tea (sugar-free)   Crystal Lite, Wyler's Lite, Minute Maid Lite   Sugar-free Jell-O   Bouillon or broth   Sugar-free Popsicle:   *  Less than 20 calories each; Limit 1 per day  Full Liquids: Protein Shakes/Drinks + 2 choices per day of other full liquids   Full liquids must be: o No More Than 12 grams of Carbs per serving  o No More Than 3 grams of Fat per serving   Strained low-fat cream soup   Non-Fat milk   Fat-free Lactaid Milk   Sugar-free yogurt (Dannon Lite & Fit, Greek yogurt)      Vitamins and Minerals   Start 1 day after surgery unless otherwise directed by your surgeon   2 Chewable Multivitamin / Multimineral Supplement with iron (i.e. Centrum for Adults)   Vitamin B-12, 350 - 500 micrograms sub-lingual (place tablet under the tongue) each day   Chewable Calcium Citrate with Vitamin D-3 (Example: 3 Chewable Calcium Plus 600 with Vitamin D-3) o Take 500 mg three (3) times a day for a total of 1500 mg each day o Do not take all 3 doses of calcium at one time as it may cause constipation, and you can only absorb 500 mg  at a time  o Do not mix multivitamins containing iron with calcium supplements; take 2 hours apart o Do not substitute Tums (calcium carbonate) for your calcium   Menstruating women and those at risk for anemia (a blood disease that causes weakness) may need extra iron o Talk with your doctor to see if you need more iron   If you need extra iron: Total daily Iron recommendation (including Vitamins) is 50 to 100 mg Iron/day   Do not stop taking or change any vitamins or minerals until you talk to  your nutritionist or surgeon   Your nutritionist and/or surgeon must approve all vitamin and mineral supplements   Activity and Exercise: It is important to continue walking at home.  Limit your physical activity as instructed by your doctor.  During this time, use these guidelines:   Do not lift anything greater than ten (10) pounds for at least two (2) weeks   Do not go back to work or drive until Engineer, production says you can   You may have sex when you feel comfortable  o It is VERY important for female patients to use a reliable birth control method; fertility often increases after surgery  o Do not get pregnant for at least 18 months   Start exercising as soon as your doctor tells you that you can o Make sure your doctor approves any physical activity   Start with a simple walking program   Walk 5-15 minutes each day, 7 days per week.    Slowly increase until you are walking 30-45 minutes per day Consider joining our [place] program. [phone] or email [email or web address]   Special Instructions Things to remember:   Free counseling is available for you and your family through collaboration between [facility] and [place]. Please call [phone] and leave a message   Use your CPAP when sleeping if this applies to you   [facility] has a free Bariatric Surgery Support Group that meets monthly, the 3rd Thursday, 6 pm, [place] can see classes online at [email or web address]   It is very important to keep all follow up appointments with your surgeon, nutritionist, primary care physician, and behavioral health practitioner o After the first year, please follow up with your bariatric surgeon and nutritionist at least once a year in order to maintain best weight loss results [facility]: [place]  [facility]: [phone] Bariatric Nurse Coordinator: [phone]

## 2013-10-30 NOTE — Progress Notes (Signed)
Patient ID: [name] [name], female   DOB: [birth year], 38 y.o.   MRN: [id number] [facility] Progress Note:   1 Day Post-Op  Subjective: Mental status is clear; nauseated Objective: Vital signs in last 24 hours: Temp:  [97.5 F (36.4 C)-99.2 F (37.3 C)] 98.3 F (36.8 C) (07/29 0550) Pulse Rate:  [64-116] 68 (07/29 0550) Resp:  [15-20] 16 (07/29 0550) BP: (90-162)/(54-85) 131/54 mmHg (07/29 0550) SpO2:  [97 %-100 %] 99 % (07/29 0550) Weight:  [270 lb 8 oz (122.698 kg)] 270 lb 8 oz (122.698 kg) (07/29 0650)  Intake/Output from previous day: 07/28 0701 - 07/29 0700 In: 4860 [I.V.:4860] Out: 4340 [Urine:4310; Blood:30] Intake/Output this shift:    Physical Exam: Work of breathing is normal.  VSS.  Nausea   Lab Results:  Results for orders placed during the hospital encounter of 10/29/13 (from the past 48 hour(s))  PREGNANCY, URINE     Status: None   Collection Time    10/29/13  5:42 AM      Result Value Ref Range   Preg Test, Ur NEGATIVE  NEGATIVE   Comment:            THE SENSITIVITY OF THIS     METHODOLOGY IS >20 mIU/mL.  HEMOGLOBIN AND HEMATOCRIT, BLOOD     Status: None   Collection Time    10/29/13 10:18 AM      Result Value Ref Range   Hemoglobin 13.7  12.0 - 15.0 g/dL   HCT 42.1  36.0 - 46.0 %  CBC     Status: None   Collection Time    10/29/13 10:18 AM      Result Value Ref Range   WBC 8.8  4.0 - 10.5 K/uL   RBC 4.27  3.87 - 5.11 MIL/uL   Hemoglobin 13.6  12.0 - 15.0 g/dL   HCT 41.7  36.0 - 46.0 %   MCV 97.7  78.0 - 100.0 fL   MCH 31.9  26.0 - 34.0 pg   MCHC 32.6  30.0 - 36.0 g/dL   RDW 12.3  11.5 - 15.5 %   Platelets 258  150 - 400 K/uL  CREATININE, SERUM     Status: Abnormal   Collection Time    10/29/13 10:18 AM      Result Value Ref Range   Creatinine, Ser 0.83  0.50 - 1.10 mg/dL   GFR calc non Af Amer 88 (*) >90 mL/min   GFR calc Af Amer >90  >90 mL/min   Comment: (NOTE)     The eGFR has been calculated using the CKD EPI  equation.     This calculation has not been validated in all clinical situations.     eGFR's persistently <90 mL/min signify possible Chronic Kidney     Disease.  CBC WITH DIFFERENTIAL     Status: Abnormal   Collection Time    10/30/13  5:15 AM      Result Value Ref Range   WBC 12.2 (*) 4.0 - 10.5 K/uL   Comment: WHITE COUNT CONFIRMED ON SMEAR   RBC 4.10  3.87 - 5.11 MIL/uL   Hemoglobin 13.2  12.0 - 15.0 g/dL   HCT 40.2  36.0 - 46.0 %   MCV 98.0  78.0 - 100.0 fL   MCH 32.2  26.0 - 34.0 pg   MCHC 32.8  30.0 - 36.0 g/dL   RDW 12.4  11.5 - 15.5 %   Platelets 227  150 - 400 K/uL  Neutrophils Relative % 79 (*) 43 - 77 %   Lymphocytes Relative 13  12 - 46 %   Monocytes Relative 8  3 - 12 %   Eosinophils Relative 0  0 - 5 %   Basophils Relative 0  0 - 1 %   Neutro Abs 9.6 (*) 1.7 - 7.7 K/uL   Lymphs Abs 1.6  0.7 - 4.0 K/uL   Monocytes Absolute 1.0  0.1 - 1.0 K/uL   Eosinophils Absolute 0.0  0.0 - 0.7 K/uL   Basophils Absolute 0.0  0.0 - 0.1 K/uL   Smear Review MORPHOLOGY UNREMARKABLE      Radiology/Results: Dg Chest 1 View  10/29/2013   CLINICAL DATA:  Shortness of breath.  EXAM: CHEST - 1 VIEW  COMPARISON:  May 15, 2013.  FINDINGS: The heart size and mediastinal contours are within normal limits. Both lungs are clear. No pneumothorax or pleural effusion is noted. The visualized skeletal structures are unremarkable.  IMPRESSION: No acute cardiopulmonary abnormality seen.   Electronically Signed   By: Sabino Dick M.D.   On: 10/29/2013 10:35    Anti-infectives: Anti-infectives   Start     Dose/Rate Route Frequency Ordered Stop   10/29/13 0509  cefOXitin (MEFOXIN) 2 g in dextrose 5 % 50 mL IVPB     2 g 100 mL/hr over 30 Minutes Intravenous On call to O.R. 10/29/13 0509 10/29/13 0717      Assessment/Plan: Problem List: Patient Active Problem List   Diagnosis Date Noted  . 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 Gallstones 10/15/2013  . Morbid obesity  09/21/2012  . Asthma 05/14/2011    Awaiting UGI.   1 Day Post-Op    LOS: 1 day   [name] Done, MD, [facility] [phone] beeper [phone]  10/30/2013 8:50 AM

## 2013-10-30 NOTE — Progress Notes (Signed)
Telephone order from MD Hassell Done that patient may begin postoperative day 1 diet, order entered Neta Mends RN 10-30-2013 11:11am

## 2013-10-31 LAB — CBC WITH DIFFERENTIAL/PLATELET
Basophils Absolute: 0 10*3/uL (ref 0.0–0.1)
Basophils Relative: 0 % (ref 0–1)
EOS PCT: 0 % (ref 0–5)
Eosinophils Absolute: 0 10*3/uL (ref 0.0–0.7)
HEMATOCRIT: 42 % (ref 36.0–46.0)
Hemoglobin: 13.7 g/dL (ref 12.0–15.0)
LYMPHS ABS: 2.3 10*3/uL (ref 0.7–4.0)
Lymphocytes Relative: 29 % (ref 12–46)
MCH: 32.2 pg (ref 26.0–34.0)
MCHC: 32.6 g/dL (ref 30.0–36.0)
MCV: 98.8 fL (ref 78.0–100.0)
MONO ABS: 0.8 10*3/uL (ref 0.1–1.0)
Monocytes Relative: 10 % (ref 3–12)
Neutro Abs: 4.9 10*3/uL (ref 1.7–7.7)
Neutrophils Relative %: 61 % (ref 43–77)
Platelets: 229 10*3/uL (ref 150–400)
RBC: 4.25 MIL/uL (ref 3.87–5.11)
RDW: 12.5 % (ref 11.5–15.5)
WBC: 8 10*3/uL (ref 4.0–10.5)

## 2013-10-31 NOTE — Progress Notes (Signed)
Pt d/c to home. Discharge instructions, gastric sleeve diet, follow up appts, reasons to return to ED/MD, and prescriptions reviewed with pt. Pt offered not questions and verbalized understanding using teach back method. PIV removed. Pt awaiting ride from friend. Will escort pt off of unit in wheelchair.

## 2013-10-31 NOTE — Discharge Instructions (Signed)
GASTRIC BYPASS/SLEEVE  Home Care Instructions   These instructions are to help you care for yourself when you go home.  Call: If you have any problems.   Call [phone] and ask for the surgeon on call   If you need immediate assistance come to the ER at [facility]. Tell the ER staff you are a new post-op gastric bypass or gastric sleeve patient  Signs and symptoms to report:   Severe  vomiting or nausea o If you cannot handle clear liquids for longer than 1 day, call your surgeon   Abdominal pain which does not get better after taking your pain medication   Fever greater than 100.4  F and chills   Heart rate over 100 beats a minute   Trouble breathing   Chest pain   Redness,  swelling, drainage, or foul odor at incision (surgical) sites   If your incisions open or pull apart   Swelling or pain in calf (lower leg)   Diarrhea (Loose bowel movements that happen often), frequent watery, uncontrolled bowel movements   Constipation, (no bowel movements for 3 days) if this happens: o Take Milk of Magnesia, 2 tablespoons by mouth, 3 times a day for 2 days if needed o Stop taking Milk of Magnesia once you have had a bowel movement o Call your doctor if constipation continues Or o Take Miralax  (instead of Milk of Magnesia) following the label instructions o Stop taking Miralax once you have had a bowel movement o Call your doctor if constipation continues   Anything you think is abnormal for you   Normal side effects after surgery:   Unable to sleep at night or unable to concentrate   Irritability   Being tearful (crying) or depressed  These are common complaints, possibly related to your anesthesia, stress of surgery, and change in lifestyle, that usually go away a few weeks after surgery. If these feelings continue, call your medical doctor.  Wound Care: You may have surgical glue, steri-strips, or staples over your incisions after surgery   Surgical glue: Looks like clear  film over your incisions and will wear off a little at a time   Steri-strips: Adhesive strips of tape over your incisions. You may notice a yellowish color on skin under the steri-strips. This is used to make the steri-strips stick better. Do not pull the steri-strips off - let them fall off   Staples: Staples may be removed before you leave the hospital o If you go home with staples, call [facility] for an appointment with your surgeons nurse to have staples removed 10 days after surgery, [phone]   Showering: You may shower two (2) days after your surgery unless your surgeon tells you differently o Wash gently around incisions with warm soapy water, rinse well, and gently pat dry o If you have a drain (tube from your incision), you may need someone to hold this while you shower o No tub baths until staples are removed and incisions are healed   Medications:   Medications should be liquid or crushed if larger than the size of a dime   Extended release pills (medication that releases a little bit at a time through the  day) should not be crushed   Depending on the size and number of medications you take, you may need to space (take a few throughout the day)/change the time you take your medications so that you do not over-fill your pouch (smaller  stomach)   Make sure you follow-up with you primary care physician to make medication changes needed during rapid weight loss and life -style changes   If you have diabetes, follow up with your doctor that orders your diabetes medication(s) within one week after surgery and check your blood sugar regularly    Do not drive while taking narcotics (pain medications)    Do not take acetaminophen (Tylenol) and Roxicet or Lortab Elixir at the same time since these pain medications contain acetaminophen   Diet:  First 2 Weeks You will see the nutritionist about two (2) weeks after your surgery. The nutritionist will increase the types of  foods you can eat if you are handling liquids well:   If you have severe vomiting or nausea and cannot handle clear liquids lasting longer than 1 day call your surgeon Protein Shake   Drink at least 2 ounces of shake 5-6 times per day   Each serving of protein shakes (usually 8-12 ounces) should have a minimum of: o 15 grams of protein o And no more than 5 grams of carbohydrate   Goal for protein each day: o Men = 80 grams per day o Women = 60 grams per day      Protein powder may be added to fluids such as non-fat milk or Lactaid milk or Soy milk (limit to 35 grams added protein powder per serving)  Hydration   Slowly increase the amount of water and other clear liquids as tolerated (See Acceptable Fluids)   Slowly increase the amount of protein shake as tolerated   Sip fluids slowly and throughout the day   May use sugar substitutes in small amounts (no more than 6-8 packets per day; i.e. Splenda)  Fluid Goal   The first goal is to drink at least 8 ounces of protein shake/drink per day (or as directed by the nutritionist); some examples of protein shakes are Johnson & Johnson, AMR Corporation, EAS Edge HP, and Unjury. - See handout from pre-op Bariatric Education Class: o Slowly increase the amount of protein shake you drink as tolerated o You may find it easier to slowly sip shakes throughout the day o It is important to get your proteins in first   Your fluid goal is to drink 64-100 ounces of fluid daily o It may take a few weeks to build up to this    32 oz. (or more) should be clear liquids And   32 oz. (or more) should be full liquids (see below for examples)   Liquids should not contain sugar, caffeine, or carbonation  Clear Liquids:   Water of Sugar-free flavored water (i.e. Fruit HO, Propel)   Decaffeinated coffee or tea (sugar-free)   Crystal lite, Wylers Lite, Minute Maid Lite   Sugar-free Jell-O   Bouillon or broth   Sugar-free Popsicle:    - Less than 20 calories  each; Limit 1 per day  Full Liquids:                   Protein Shakes/Drinks + 2 choices per day of other full liquids   Full liquids must be: o No More Than 12 grams of Carbs per serving o No More Than 3 grams of Fat per serving   Strained low-fat cream soup   Non-Fat milk   Fat-free Lactaid Milk   Sugar-free yogurt (Dannon Lite & Fit, Greek yogurt)    Vitamins and Minerals   Start 1 day after surgery unless otherwise directed by  your surgeon   2 Chewable Multivitamin / Multimineral Supplement with iron (i.e. Centrum for Adults)   Vitamin B-12, 350-500 micrograms sub-lingual (place tablet under the tongue) each day   Chewable Calcium Citrate with Vitamin D-3 (Example: 3 Chewable Calcium  Plus 600 with Vitamin D-3) o Take 500 mg three (3) times a day for a total of 1500 mg each day o Do not take all 3 doses of calcium at one time as it may cause constipation, and you can only absorb 500 mg at a time o Do not mix multivitamins containing iron with calcium supplements;  take 2 hours apart o Do not substitute Tums (calcium carbonate) for your calcium   Menstruating women and those at risk for anemia ( a blood disease that causes weakness) may need extra iron o Talk to your doctor to see if you need more iron   If you need extra iron: Total daily Iron recommendation (including Vitamins) is 50 to 100 mg Iron/day   Do not stop taking or change any vitamins or minerals until you talk to your nutritionist or surgeon   Your nutritionist and/or surgeon must approve all vitamin and mineral supplements   Activity and Exercise: It is important to continue walking at home. Limit your physical activity as instructed by your doctor. During this time, use these guidelines:   Do not lift anything greater than ten  (10) pounds for at least two (2) weeks   Do not go back to work or drive until Engineer, production says you can   You may have sex when you feel comfortable o It is VERY important for female  patients to use a reliable birth control method; fertility often increase after surgery o Do not get pregnant for at least 18 months   Start exercising as soon as your doctor tells you that you can o Make sure your doctor approves any physical activity   Start with a simple walking program   Walk 5-15 minutes each day, 7 days per week   Slowly increase until you are walking 30-45 minutes per day   Consider joining our [place] program. [phone] or email [email or web address]   Special Instructions Things to remember:   Free counseling is available for you and your family through collaboration between [facility] and [place]. Please call [phone] and leave a message   Use your CPAP when sleeping if this applies to you   Consider buying a medical alert bracelet that says you had lap-band surgery     You will likely have your first fill (fluid added to your band) 6 - 8 weeks after surgery   [facility] has a free Bariatric Surgery Support Group that meets monthly, the 3rd Thursday, [place]. You can see classes online at [email or web address]   It is very important to keep all follow up appointments with your surgeon, nutritionist, primary care physician, and behavioral health practitioner o After the first year, please follow up with your bariatric surgeon and nutritionist at least once a year in order to maintain best weight loss results                    [facility]:  [facility]: [phone]               Bariatric Nurse Coordinator: [phone]  Gastric Bypass/Sleeve  Home Care Instructions  Rev. 05/2012                                                         Reviewed and Endorsed                                                    by [facility] Patient Education Committee, Jan, 2014         Sleeve Gastrectomy A sleeve gastrectomy is a surgery in  which a large portion of the stomach is removed. After the surgery, the stomach will be a narrow tube about the size of a banana. This surgery is performed to help a person lose weight. The person loses weight because the reduced size of the stomach restricts the amount of food that the person can eat. The stomach will hold much less food than before the surgery. Also, the part of the stomach that is removed produces a hormone that causes hunger.  This surgery is done for people who have morbid obesity, defined as a body mass index (BMI) greater than 40. BMI is an estimate of body fat and is calculated from the height and weight of a person. This surgery may also be done for people with a BMI between 35 and 40 if they have other diseases, such as type 2 diabetes mellitus, obstructive sleep apnea, or heart and lung disorders (cardiopulmonary diseases).  LET [facility] CARE PROVIDER KNOW ABOUT:  Any allergies you have.   All medicines you are taking, including vitamins, herbs, eyedrops, creams, and over-the-counter medicines.   Use of steroids (by mouth or creams).   Previous problems you or members of your family have had with the use of anesthetics.   Any blood disorders you have.   Previous surgeries you have had.   Possibility of pregnancy, if this applies.   Other health problems you have. RISKS AND COMPLICATIONS Generally, sleeve gastrectomy is a safe procedure. However, as with any procedure, complications can occur. Possible complications include:  Infection.  Bleeding.  Blood clots.  Damage to other organs or tissue.  Leakage of fluid from the stomach into the abdominal cavity (rare). BEFORE THE PROCEDURE  You may need to have blood tests and imaging tests (such as X-rays or ultrasonography) done before the day of surgery. A test to evaluate your esophagus and how it moves (esophageal manometry) may also be done.  You may be placed on a liquid diet 2-3 weeks before  the surgery.  Ask your health care provider about changing or stopping your regular medicines.  Do not eat or drink anything for at least 8 hours before the procedure.   Make plans to have someone drive you home after your hospital stay. Also arrange for someone to help you with activities during recovery. PROCEDURE  A laparoscopic technique is usually used for this surgery:  You will be given medicine to make you sleep through the procedure (general anesthetic). This medicine will be given through an intravenous (IV) access tube that is put into one of your veins.  Once you are asleep, your abdomen will be cleaned and sterilized.  Several small incisions will  be made in your abdomen.  Your abdomen will be filled with air so that it expands. This gives the surgeon more room to operate and makes your organs easier to see.  A thin, lighted tube with a tiny camera on the end (laparoscope) is put through a small incision in your abdomen. The camera on the laparoscope sends a picture to a TV screen in the operating room. This gives the surgeon a good view inside the abdomen.  Hollow tubes are put through the other small incisions in your abdomen. The tools needed for the procedure are put through these tubes.  The surgeon uses staples to divide part of the stomach and then removes it through one of the incisions.  The remaining stomach may be reinforced using stitches or surgical glue or both to prevent leakage of the stomach contents. A small tube (drain) may be placed through one of the incisions to allow extra fluid to flow from the area.  The incisions are closed with stitches, staples, or glue. AFTER THE PROCEDURE  You will be monitored closely in a recovery area. Once the anesthetic has worn off, you will likely be moved to a regular hospital room.  You will be given medicine for pain and nausea.   You may have a drain from one of the incisions in your abdomen. If a drain is used,  it may stay in place after you go home from the hospital and be removed at a follow-up appointment.   You will be encouraged to walk around several times a day. This helps prevent blood clots.  You will be started on a liquid diet the first day after your surgery. Sometimes a test is done to check for leaking before you can eat.  You will be urged to cough and do deep breathing exercises. This helps prevent a lung infection after a surgery.  You will likely need to stay in the hospital for a few days.  Document Released: 01/16/2009 Document Revised: 11/21/2012 Document Reviewed: 08/03/2012 [facility] Patient Information 2015 [place], [place]. This information is not intended to replace advice given to you by your health care provider. Make sure you discuss any questions you have with your health care provider.

## 2013-10-31 NOTE — Progress Notes (Signed)
Nutrition Education Note  Received referral for diet education per DROP protocol.   Discussed 2 week post op diet with pt. Emphasized that liquids must be non carbonated, non caffeinated, and sugar free. Fluid goals discussed. Pt to follow up with outpatient bariatric RD for further diet progression after 2 weeks. Multivitamins and minerals also reviewed. Teach back method used, pt expressed understanding, expect good compliance. Pt expressed some anxiety about being afraid of throwing up, however denies any nausea. Encouraged pt to sip protein shake slowly. Emotional support provided.    Diet: First 2 Weeks  You will see the nutritionist about two (2) weeks after your surgery. The nutritionist will increase the types of foods you can eat if you are handling liquids well:  If you have severe vomiting or nausea and cannot handle clear liquids lasting longer than 1 day, call your surgeon  Protein Shake  Drink at least 2 ounces of shake 5-6 times per day  Each serving of protein shakes (usually 8 - 12 ounces) should have a minimum of:  15 grams of protein  And no more than 5 grams of carbohydrate  Goal for protein each day:  Men = 80 grams per day  Women = 60 grams per day  Protein powder may be added to fluids such as non-fat milk or Lactaid milk or Soy milk (limit to 35 grams added protein powder per serving)   Hydration  Slowly increase the amount of water and other clear liquids as tolerated (See Acceptable Fluids)  Slowly increase the amount of protein shake as tolerated  Sip fluids slowly and throughout the day  May use sugar substitutes in small amounts (no more than 6 - 8 packets per day; i.e. Splenda)   Fluid Goal  The first goal is to drink at least 8 ounces of protein shake/drink per day (or as directed by the nutritionist); some examples of protein shakes are Johnson & Johnson, AMR Corporation, EAS Edge HP, and Unjury. See handout from pre-op Bariatric Education Class:  Slowly  increase the amount of protein shake you drink as tolerated  You may find it easier to slowly sip shakes throughout the day  It is important to get your proteins in first  Your fluid goal is to drink 64 - 100 ounces of fluid daily  It may take a few weeks to build up to this  32 oz (or more) should be clear liquids  And  32 oz (or more) should be full liquids (see below for examples)  Liquids should not contain sugar, caffeine, or carbonation   Clear Liquids:  Water or Sugar-free flavored water (i.e. Fruit H2O, Propel)  Decaffeinated coffee or tea (sugar-free)  Crystal Lite, Wyler's Lite, Minute Maid Lite  Sugar-free Jell-O  Bouillon or broth  Sugar-free Popsicle: *Less than 20 calories each; Limit 1 per day   Full Liquids:  Protein Shakes/Drinks + 2 choices per day of other full liquids  Full liquids must be:  No More Than 12 grams of Carbs per serving  No More Than 3 grams of Fat per serving  Strained low-fat cream soup  Non-Fat milk  Fat-free Lactaid Milk  Sugar-free yogurt (Dannon Lite & Fit, [place] yogurt)     [place], RD, [place] [phone] Pager [phone] Weekend/After Hours Pager

## 2013-10-31 NOTE — Progress Notes (Signed)
Patient alert and oriented, pain is controlled. Patient is tolerating protein shake and water today. Reviewed Gastric Sleeve discharge instructions with patient, utilizing teach back method; patient is able to articulate understanding. Provided information on BELT program, Support Group and [facility] outpatient pharmacy. All questions answered. Patient ready for discharge home.

## 2013-10-31 NOTE — Discharge Summary (Signed)
Physician Discharge Summary  Patient ID: [name] [name] MRN: [id number] DOB/AGE: [id number] y.o.  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  PD 1 had nausea requiring Phenergan.  This resolved and she was ready for discharge on PD 2  Consults: none  Significant Diagnostic Studies: UGI showed sleeve to look good    Discharge Exam: Blood pressure 126/72, pulse 76, temperature 98.2 F (36.8 C), temperature source Oral, resp. rate 16, height 5' (1.524 m), weight 267 lb 8 oz (121.337 kg), last menstrual period 10/25/2013, SpO2 98.00%. Incisions OK. Minimal pain  Disposition: 01-Home or Self Care  Discharge Instructions   Discharge instructions    Complete by:  As directed   Follow dietary guidelines as prescribed May begin exercise and walking daily            Medication List         albuterol 108 (90 BASE) MCG/ACT inhaler  Commonly known as:  PROVENTIL HFA;VENTOLIN HFA  Inhale 1 puff into the lungs every 6 (six) hours as needed for wheezing or shortness of breath.     clobetasol 0.05 % external solution  Commonly known as:  TEMOVATE  Apply 1 application topically daily as needed (Itching). To scalp     Fluticasone-Salmeterol 100-50 MCG/DOSE Aepb  Commonly known as:  ADVAIR  Inhale 1 puff into the lungs daily as needed (Shortness of breath).     mometasone 50 MCG/ACT nasal spray  Commonly known as:  NASONEX  Place 2 sprays into the nose daily as needed (Allergies).     montelukast 10 MG tablet  Commonly known as:  SINGULAIR  Take 10 mg by mouth daily.     PROMISEB Crea  Apply 1 application topically every morning.            Follow-up Information   Follow up with [name], MD.   Specialty:  General Surgery   Contact information:   [address] [place] [address] [phone]       Signed: [name] 10/31/2013, 8:58 AM

## 2013-11-01 ENCOUNTER — Telehealth (HOSPITAL_COMMUNITY): Payer: Self-pay | Admitting: *Deleted

## 2013-11-01 NOTE — Telephone Encounter (Signed)
Made discharge phone call to patient per DROP protocol. Asking the following questions.    1. Do you have someone to care for you now that you are home?  Yes 2. Are you having pain now that is not relieved by your pain medication?  No 3. Are you able to drink the recommended daily amount of fluids (48 ounces minimum/day) and protein (60-80 grams/day) as prescribed by the dietitian or nutritional counselor?  Yes for fluids; 40 gms protein; encouraged increased  4. Are you taking the vitamins and minerals as prescribed?  Yes. Discussed dosage of B12 supplement, and Patient OK. 5. Do you have the "on call" number to contact your surgeon if you have a problem or question? Yes.  6. Are your incisions free of redness, swelling or drainage? (If steri strips, address that these can fall off, shower as tolerated) Yes. 7. Have your bowels moved since your surgery? Yes. If not, are you passing gas? Yes.  8. Are you up and walking 3-4 times per day?  Yes. 9. Do you have an appointment to see a dietitian or nutritional counselor in the next month?  Yes.  The following questions can be added at the center's discharge phone call script at the center's discretion.  The questions are also captured within [employer] project custom fields. 1. Do you have an appointment made to see your surgeon in the next month?  Yes. 2. Were you provided your discharge medications before your surgery or before you were discharged from the hospital and are you taking them without problem?  Yes. 3. Were you provided phone numbers to the clinic/surgeon's office?  Yes. 4. Did you watch the patient education video module in the (clinic, surgeon's office, etc.) before your surgery? Yes. 5. Do you have a discharge checklist that was provided to you in the hospital to reference with instructions on how to take care of yourself after surgery?  Yes. 6. Did you see a dietitian or nutritional counselor while you were in the hospital?  Yes.

## 2013-11-04 ENCOUNTER — Other Ambulatory Visit: Payer: Self-pay | Admitting: Family Medicine

## 2013-11-12 ENCOUNTER — Encounter: Payer: BC Managed Care – PPO | Attending: Surgery

## 2013-11-12 DIAGNOSIS — Z6841 Body Mass Index (BMI) 40.0 and over, adult: Secondary | ICD-10-CM | POA: Insufficient documentation

## 2013-11-12 DIAGNOSIS — Z713 Dietary counseling and surveillance: Secondary | ICD-10-CM | POA: Insufficient documentation

## 2013-11-12 NOTE — Progress Notes (Signed)
Bariatric Class:  Appt start time: 1530 end time:  1630.  2 Week Post-Operative Nutrition Class  Patient was seen on 11/12/2013 for Post-Operative Nutrition [facility].   Surgery date: 10/29/2013 Surgery type: Gastric sleeve [facility] at [facility]: 277.5 lbs on 05/08/2013 Weight today: 257.0 lbs Weight change: 29 lbs   TANITA  BODY COMP RESULTS  10/14/13 11/12/13   BMI (kg/m^2) 55.9 50.2   Fat Mass (lbs) 150 136.5   Fat Free Mass (lbs) 136 120.5   Total Body Water (lbs) 99.5 88.0    The following the learning objectives were met by the patient during this course:  Identifies Phase 3A (Soft, High Proteins) Dietary Goals and will begin from 2 weeks post-operatively to 2 months post-operatively  Identifies appropriate sources of fluids and proteins   States protein recommendations and appropriate sources post-operatively  Identifies the need for appropriate texture modifications, mastication, and bite sizes when consuming solids  Identifies appropriate multivitamin and calcium sources post-operatively  Describes the need for physical activity post-operatively and will follow MD recommendations  States when to call healthcare provider regarding medication questions or post-operative complications  Handouts given during class include:  Phase 3A: Soft, High Protein Diet Handout  Follow-Up Plan: Patient will follow-up at [facility] in 6 weeks for 2 month post-op nutrition visit for diet advancement per MD.

## 2013-11-12 NOTE — Patient Instructions (Signed)
Patient to follow Phase 3A-Soft, High Protein Diet and follow-up at [facility] in 6 weeks for 2 months post-op nutrition visit for diet advancement. 

## 2013-11-15 ENCOUNTER — Encounter (INDEPENDENT_AMBULATORY_CARE_PROVIDER_SITE_OTHER): Payer: Self-pay | Admitting: Surgery

## 2013-11-15 ENCOUNTER — Ambulatory Visit (INDEPENDENT_AMBULATORY_CARE_PROVIDER_SITE_OTHER): Payer: BC Managed Care – PPO | Admitting: Surgery

## 2013-11-15 VITALS — BP 128/84 | HR 77 | Temp 97.5°F | Ht 60.0 in | Wt 255.0 lb

## 2013-11-15 DIAGNOSIS — Z9884 Bariatric surgery status: Secondary | ICD-10-CM

## 2013-11-15 NOTE — Patient Instructions (Signed)
May return to work when you feel ready if that is sooner than planned. May go swimming.

## 2013-11-15 NOTE — Progress Notes (Signed)
[name] [name] 38 y.o.  Body mass index is 49.8 kg/(m^2).  Patient Active Problem List   Diagnosis Date Noted  . 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 Gallstones 10/15/2013  . Morbid obesity 09/21/2012  . Asthma 05/14/2011    Allergies  Allergen Reactions  . Other Other (See Comments)    Nut allergy rash, tongue swells  . Amoxicillin Rash  . Codeine Nausea And Vomiting and Rash  . Tramadol Nausea And Vomiting and Rash      Past Surgical History  Procedure Laterality Date  . Knee arthroscopy Left 9-10 yrs ago  . Foot neuroma surgery Right 2013  . Tubal ligation  2004  . Laparoscopic gastric sleeve resection [name] 10/29/2013    Procedure: LAPAROSCOPIC GASTRIC SLEEVE RESECTION AND REPAIR OF HIATAL HERNIA;  Surgeon: [name], MD;  Location: [facility];  Service: General;  Laterality: [name];  . Upper gi endoscopy  10/29/2013    Procedure: UPPER GI ENDOSCOPY;  Surgeon: [name], MD;  Location: [facility];  Service: General;;   [name], MD No diagnosis found.  Doing well.  Has lost 32 lbs.  Tolerating shakes and ready to advance exercise.  Will see back in 2 months. [name] Done, MD, [facility] [phone] beeper [phone]  11/15/2013 10:00 AM

## 2013-11-18 ENCOUNTER — Telehealth (INDEPENDENT_AMBULATORY_CARE_PROVIDER_SITE_OTHER): Payer: Self-pay

## 2013-11-18 ENCOUNTER — Telehealth: Payer: Self-pay | Admitting: Dietician

## 2013-11-18 NOTE — Telephone Encounter (Signed)
Spoke to [name] today since she is having trouble tolerating protein shakes. Recommended that she try having a few bites of protein foods every few hours and work on sipping water throughout the day. Encouraged her to try adding Unflavored [place] to decaf coffee or flavored water by the end of the week to try to meet her protein goal of 60g.

## 2013-11-18 NOTE — Telephone Encounter (Signed)
Patient states her BMs are hard ask for suggestion. Advised patient to increase her fluids,ie prune juice warmed, increase activity as much as possible, stool softener otc   .She did have bm yesterday. Patient will call if she does not improve .

## 2013-12-24 ENCOUNTER — Encounter: Payer: BC Managed Care – PPO | Attending: Surgery | Admitting: Dietician

## 2013-12-24 DIAGNOSIS — Z6841 Body Mass Index (BMI) 40.0 and over, adult: Secondary | ICD-10-CM | POA: Diagnosis not present

## 2013-12-24 DIAGNOSIS — Z713 Dietary counseling and surveillance: Secondary | ICD-10-CM | POA: Diagnosis not present

## 2013-12-24 NOTE — Progress Notes (Signed)
  Follow-up visit:  8 Weeks Post-Operative Sleeve Gastrectomy Surgery  Medical Nutrition Therapy:  Appt start time: 3790 end time:  1900.  Primary concerns today: Post-operative Bariatric Surgery Nutrition Management. Overall things have been going really well. Has lost 19.5 lbs since last visit. Has started doing water aerobics 5 x week for 60 minutes. Tolerating most foods, though does not like eggs anymore.  Craving sweets though does not like yogurt. Feels like it may be psychological. Tried a cookie and it sat in her esophagus.  Surgery date: 10/29/2013 Surgery type: Gastric sleeve Start weight at [facility]: 277.5 lbs on 05/08/2013 Weight today: 237.5 lbs  Weight change: 19.5 lbs, 22.5 fat loss Total weight loss: 40 lbs Weight loss goal: 200 by February 22nd, then 150 lbs  TANITA  BODY COMP RESULTS  10/14/13 11/12/13 12/24/13   BMI (kg/m^2) 55.9 50.2 46.4   Fat Mass (lbs) 150 136.5 114.0   Fat Free Mass (lbs) 136 120.5 123.5   Total Body Water (lbs) 99.5 88.0 90.5    Preferred Learning Style:   No preference indicated   Learning Readiness:   Ready  24-hr recall: B (AM): protein shake whey protein with milk (~29 g) Snk (AM): sometimes will have a cheese stick (6 g)  L (PM): pork chop 2-3 oz with green beans and squash or protein shake (14-21 g) Snk (PM): cheese or ham (6-7 g)  D (PM): pork chop 2-3 oz with green beans and squash (14-21 g) Snk (PM): none  Fluid intake: 32 oz water, 12 oz protein shake, 8 oz G2 = 52 oz fluid Estimated total protein intake: 57-83 g  Medications: see list  Supplementation: Taking (flintstones, citrical petites, B12)  Using straws: No Drinking while eating: No Hair loss: No Carbonated beverages: No N/V/D/C: some nausea when eating too fast but no vomiting, constipation - taking miralax Dumping syndrome: No  Recent physical activity:  Water aerobics 5 x week for 60 minutes  Progress Towards Goal(s):  In progress.  Handouts given during  visit include:  Phase 3B High Protein + Non Starchy Vegetables   Nutritional Diagnosis:  [place]-3.3 Overweight/obesity related to past poor dietary habits and physical inactivity as evidenced by patient w/ recent sleeve gastrectomy surgery following dietary guidelines for continued weight loss.    Intervention:  Nutrition education/diet advancement.  Goals:  Follow Phase 3B: High Protein + Non-Starchy Vegetables  Eat 3-6 small meals/snacks, every 3-5 hrs  Increase lean protein foods to meet 60g goal  Increase fluid intake to 64oz +  Avoid drinking 15 minutes before, during and 30 minutes after eating  Aim for >30 min of physical activity daily  Teaching Method Utilized:  Visual Auditory Hands on  Barriers to learning/adherence to lifestyle change: none  Demonstrated degree of understanding via:  Teach Back   Monitoring/Evaluation:  Dietary intake, exercise, lap band fills, and body weight. Follow up in 1 months for 3 month post-op visit.

## 2013-12-24 NOTE — Patient Instructions (Addendum)
Goals:  Follow Phase 3B: High Protein + Non-Starchy Vegetables  Eat 3-6 small meals/snacks, every 3-5 hrs  Increase lean protein foods to meet 60g goal  Increase fluid intake to 64oz +  Avoid drinking 15 minutes before, during and 30 minutes after eating  Aim for >30 min of physical activity daily  Surgery date: 10/29/2013 Surgery type: Gastric sleeve Start weight at [facility]: 277.5 lbs on 05/08/2013 Weight today: 237.5 lbs  Weight change: 19.5 lbs, 22.5 fat loss Total weight loss: 40 lbs Weight loss goal: 200 by February 22nd, then 150 lbs  TANITA  BODY COMP RESULTS  10/14/13 11/12/13 12/24/13   BMI (kg/m^2) 55.9 50.2 46.4   Fat Mass (lbs) 150 136.5 114.0   Fat Free Mass (lbs) 136 120.5 123.5   Total Body Water (lbs) 99.5 88.0 90.5

## 2014-01-12 ENCOUNTER — Ambulatory Visit (INDEPENDENT_AMBULATORY_CARE_PROVIDER_SITE_OTHER): Payer: BC Managed Care – PPO | Admitting: Internal Medicine

## 2014-01-12 VITALS — BP 108/78 | HR 88 | Temp 98.0°F | Resp 18 | Ht 60.5 in | Wt 229.0 lb

## 2014-01-12 DIAGNOSIS — R309 Painful micturition, unspecified: Secondary | ICD-10-CM

## 2014-01-12 DIAGNOSIS — N390 Urinary tract infection, site not specified: Secondary | ICD-10-CM

## 2014-01-12 LAB — POCT URINALYSIS DIPSTICK
Glucose, UA: NEGATIVE
Ketones, UA: NEGATIVE
Nitrite, UA: NEGATIVE
RBC UA: NEGATIVE
Spec Grav, UA: 1.02
Urobilinogen, UA: 0.2
pH, UA: 7.5

## 2014-01-12 LAB — POCT UA - MICROSCOPIC ONLY
CASTS, UR, LPF, POC: NEGATIVE
CRYSTALS, UR, HPF, POC: NEGATIVE
YEAST UA: NEGATIVE

## 2014-01-12 LAB — POCT URINE PREGNANCY: Preg Test, Ur: NEGATIVE

## 2014-01-12 MED ORDER — CIPROFLOXACIN HCL 500 MG PO TABS: 500.0000 mg | ORAL_TABLET | Freq: Two times a day (BID) | ORAL | Status: DC

## 2014-01-12 NOTE — Progress Notes (Signed)
   Subjective:    Patient ID: [name] [name], female    DOB: 1975-08-08, 38 y.o.   MRN: [id number]  HPI urinary pressure and frequency for the past 24 hours No dysuria No abdominal pain or flank pain No recent UTIs  Gastric sleeve placed June 2015 and has lost 60 pounds BTL but wants pregnancy test anyway   Review of Systems Noncontributory    Objective:   Physical Exam BP 108/78  Pulse 88  Temp(Src) 98 F (36.7 C) (Oral)  Resp 18  Ht 5' 0.5" (1.537 m)  Wt 229 lb (103.874 kg)  BMI 43.97 kg/m2  SpO2 98%  LMP 12/23/2013 No abdominal or flank tenderness  Results for orders placed in visit on 01/12/14  POCT UA - MICROSCOPIC ONLY      Result Value Ref Range   WBC, Ur, HPF, POC 10-15     RBC, urine, microscopic 0-2     Bacteria, U Microscopic 1+     Mucus, UA trace     Epithelial cells, urine per micros 2-4     Crystals, Ur, HPF, POC neg     Casts, Ur, LPF, POC neg     Yeast, UA neg    POCT URINALYSIS DIPSTICK      Result Value Ref Range   Color, UA yellow     Clarity, UA cloudy     Glucose, UA neg     Bilirubin, UA small     Ketones, UA neg     Spec Grav, UA 1.020     Blood, UA neg     pH, UA 7.5     Protein, UA trace     Urobilinogen, UA 0.2     Nitrite, UA neg     Leukocytes, UA small (1+)    POCT URINE PREGNANCY      Result Value Ref Range   Preg Test, Ur Negative            Assessment & Plan:  Urinary pain - Plan: POCT UA - Microscopic Only, POCT urinalysis dipstick, POCT urine pregnancy, Urine culture  Urinary tract infection without hematuria, site unspecified  Meds ordered this encounter  Medications  . ciprofloxacin (CIPRO) 500 MG tablet    Sig: Take 1 tablet (500 mg total) by mouth 2 (two) times daily.    Dispense:  10 tablet    Refill:  0  culture

## 2014-01-15 ENCOUNTER — Ambulatory Visit (INDEPENDENT_AMBULATORY_CARE_PROVIDER_SITE_OTHER): Payer: BC Managed Care – PPO | Admitting: Surgery

## 2014-01-15 LAB — URINE CULTURE: Colony Count: 25000

## 2014-01-30 ENCOUNTER — Encounter: Payer: BC Managed Care – PPO | Attending: Surgery | Admitting: Dietician

## 2014-01-30 DIAGNOSIS — Z6841 Body Mass Index (BMI) 40.0 and over, adult: Secondary | ICD-10-CM | POA: Diagnosis not present

## 2014-01-30 DIAGNOSIS — Z713 Dietary counseling and surveillance: Secondary | ICD-10-CM | POA: Diagnosis not present

## 2014-01-30 NOTE — Progress Notes (Signed)
  Follow-up visit:  12 Weeks Post-Operative Sleeve Gastrectomy Surgery  Medical Nutrition Therapy:  Appt start time: 9563 end time:  8756.  Primary concerns today: Post-operative Bariatric Surgery Nutrition Management. Overall things have been going really well. Has lost 14 lbs since last visit. Has started doing water aerobics 5 x week for 60 minutes. Going roller skating every Saturday. Still tolerating all food except eggs.   Finds that she craves sweets sometimes usually during her cycle. Will have about 4 M&Ms which satisfies her craving.   Surgery date: 10/29/2013 Surgery type: Gastric sleeve Start weight at [facility]: 277.5 lbs on 05/08/2013 Weight today: 223.5 lbs  Weight change: 14 lbs Total weight loss: 54 lbs Weight loss goal: 200 by February 22nd, then 150 lbs  TANITA  BODY COMP RESULTS  10/14/13 11/12/13 12/24/13 01/30/14   BMI (kg/m^2) 55.9 50.2 46.4 43.6   Fat Mass (lbs) 150 136.5 114.0 103.5   Fat Free Mass (lbs) 136 120.5 123.5 120.0   Total Body Water (lbs) 99.5 88.0 90.5 88.0    Preferred Learning Style:   No preference indicated   Learning Readiness:   Ready  24-hr recall: B (AM): Atkins shake (15 g) Snk (AM): sometimes will have a cheese stick (6 g)  L (PM): pork chop 2-3 oz with green beans and squash or protein shake (14-21 g) Snk (PM): Atkins shake (15 g) D (PM): pork chop 2-3 oz with green beans and squash (14-21 g) Snk (PM): none  Fluid intake: 32 oz water, 22 oz protein shake, 8 oz diluted Gatorade  = 54-62 oz fluid Estimated total protein intake: 64-70 g  Medications: see list  Supplementation: Taking (flintstones, citrical petites, B12)  Using straws: No Drinking while eating: No Hair loss: No Carbonated beverages: No N/V/D/C: some nausea when eating too fast or some foods make her nauseas, doesn't have constipation if she eats enough vegetables  Dumping syndrome: No  Recent physical activity:  Water aerobics 5 x week for 60 minutes,  rollerskating on Saturday  Progress Towards Goal(s):  In progress.   Nutritional Diagnosis:  [place]-3.3 Overweight/obesity related to past poor dietary habits and physical inactivity as evidenced by patient w/ recent sleeve gastrectomy surgery following dietary guidelines for continued weight loss.    Intervention:  Nutrition education/diet advancement.  Goals:  Follow Phase 3B: High Protein + Non-Starchy Vegetables  Eat 3-6 small meals/snacks, every 3-5 hrs  Increase lean protein foods to meet 60g goal  Increase fluid intake to 64oz +  Avoid drinking 15 minutes before, during and 30 minutes after eating  Aim for >30 min of physical activity daily  If you want to try fruit, have a couple bites with protein (try to limit to 1-2 x week)  Try incorporate foods with potassium (see list)  Teaching Method Utilized:  Visual Auditory Hands on  Barriers to learning/adherence to lifestyle change: none  Demonstrated degree of understanding via:  Teach Back   Monitoring/Evaluation:  Dietary intake, exercise, and body weight. Follow up in 3 months for 6 month post-op visit.

## 2014-01-30 NOTE — Patient Instructions (Addendum)
Goals:  Follow Phase 3B: High Protein + Non-Starchy Vegetables  Eat 3-6 small meals/snacks, every 3-5 hrs  Increase lean protein foods to meet 60g goal  Increase fluid intake to 64oz +  Avoid drinking 15 minutes before, during and 30 minutes after eating  Aim for >30 min of physical activity daily  If you want to try fruit, have a couple bites with protein (try to limit to 1-2 x week)  Try incorporate foods with potassium (see list)  Surgery date: 10/29/2013 Surgery type: Gastric sleeve Start weight at [facility]: 277.5 lbs on 05/08/2013 Weight today: 223.5 lbs  Weight change: 14 lbs Total weight loss: 54 lbs Weight loss goal: 200 by February 22nd, then 150 lbs  TANITA  BODY COMP RESULTS  10/14/13 11/12/13 12/24/13 01/30/14   BMI (kg/m^2) 55.9 50.2 46.4 43.6   Fat Mass (lbs) 150 136.5 114.0 103.5   Fat Free Mass (lbs) 136 120.5 123.5 120.0   Total Body Water (lbs) 99.5 88.0 90.5 88.0

## 2014-04-11 ENCOUNTER — Ambulatory Visit (INDEPENDENT_AMBULATORY_CARE_PROVIDER_SITE_OTHER): Payer: BLUE CROSS/BLUE SHIELD | Admitting: Internal Medicine

## 2014-04-11 VITALS — BP 112/70 | HR 81 | Temp 97.6°F | Resp 16 | Ht 65.2 in | Wt 201.8 lb

## 2014-04-11 DIAGNOSIS — T148XXA Other injury of unspecified body region, initial encounter: Secondary | ICD-10-CM

## 2014-04-11 DIAGNOSIS — Z9889 Other specified postprocedural states: Secondary | ICD-10-CM

## 2014-04-11 DIAGNOSIS — T148 Other injury of unspecified body region: Secondary | ICD-10-CM

## 2014-04-11 DIAGNOSIS — R5383 Other fatigue: Secondary | ICD-10-CM

## 2014-04-11 DIAGNOSIS — R634 Abnormal weight loss: Secondary | ICD-10-CM

## 2014-04-11 LAB — COMPREHENSIVE METABOLIC PANEL
ALK PHOS: 56 U/L (ref 39–117)
ALT: 20 U/L (ref 0–35)
AST: 19 U/L (ref 0–37)
Albumin: 4.4 g/dL (ref 3.5–5.2)
BILIRUBIN TOTAL: 0.5 mg/dL (ref 0.2–1.2)
BUN: 10 mg/dL (ref 6–23)
CHLORIDE: 102 meq/L (ref 96–112)
CO2: 27 mEq/L (ref 19–32)
Calcium: 9.5 mg/dL (ref 8.4–10.5)
Creat: 0.83 mg/dL (ref 0.50–1.10)
GLUCOSE: 85 mg/dL (ref 70–99)
POTASSIUM: 4.5 meq/L (ref 3.5–5.3)
Sodium: 141 mEq/L (ref 135–145)
Total Protein: 7.1 g/dL (ref 6.0–8.3)

## 2014-04-11 LAB — POCT UA - MICROSCOPIC ONLY
Casts, Ur, LPF, POC: NEGATIVE
Crystals, Ur, HPF, POC: NEGATIVE
YEAST UA: NEGATIVE

## 2014-04-11 LAB — POCT CBC
GRANULOCYTE PERCENT: 64 % (ref 37–80)
HCT, POC: 43.8 % (ref 37.7–47.9)
Hemoglobin: 14.4 g/dL (ref 12.2–16.2)
Lymph, poc: 1.8 (ref 0.6–3.4)
MCH, POC: 33.1 pg — AB (ref 27–31.2)
MCHC: 33 g/dL (ref 31.8–35.4)
MCV: 100.5 fL — AB (ref 80–97)
MID (cbc): 0.3 (ref 0–0.9)
MPV: 9.8 fL (ref 0–99.8)
PLATELET COUNT, POC: 203 10*3/uL (ref 142–424)
POC GRANULOCYTE: 3.8 (ref 2–6.9)
POC LYMPH PERCENT: 30.6 %L (ref 10–50)
POC MID %: 5.4 % (ref 0–12)
RBC: 4.36 M/uL (ref 4.04–5.48)
RDW, POC: 12.4 %
WBC: 5.9 10*3/uL (ref 4.6–10.2)

## 2014-04-11 LAB — POCT URINALYSIS DIPSTICK
GLUCOSE UA: NEGATIVE
Nitrite, UA: NEGATIVE
PH UA: 6
PROTEIN UA: 30
Spec Grav, UA: >=1.03
Urobilinogen, UA: 0.2

## 2014-04-11 LAB — VITAMIN B12: VITAMIN B 12: 670 pg/mL (ref 211–911)

## 2014-04-11 LAB — IRON AND TIBC
%SAT: 43 % (ref 20–55)
IRON: 137 ug/dL (ref 42–145)
TIBC: 319 ug/dL (ref 250–470)
UIBC: 182 ug/dL (ref 125–400)

## 2014-04-11 LAB — POCT GLYCOSYLATED HEMOGLOBIN (HGB A1C): Hemoglobin A1C: 4.7

## 2014-04-11 LAB — TSH: TSH: 1.533 u[IU]/mL (ref 0.350–4.500)

## 2014-04-11 LAB — MAGNESIUM: Magnesium: 1.9 mg/dL (ref 1.5–2.5)

## 2014-04-11 LAB — GLUCOSE, POCT (MANUAL RESULT ENTRY): POC GLUCOSE: 93 mg/dL (ref 70–99)

## 2014-04-11 LAB — LIPASE: Lipase: <10 U/L (ref 0–75)

## 2014-04-11 NOTE — Patient Instructions (Addendum)
Gastric Banding Surgery Care After Refer to this sheet in the next few weeks. These discharge instructions provide you with general information on caring for yourself after you leave the hospital. Your caregiver may also give you specific instructions. Your treatment has been planned according to the most current medical practices available, but unavoidable complications sometimes occur. If you have any problems or questions after discharge, call your caregiver. HOME CARE INSTRUCTIONS  Activity  Take frequent walks throughout the day. This will help to prevent blood clots. Do not sit for longer than 45 minutes to 1 hour while awake for 4 to 6 weeks after surgery.  Continue to do coughing and deep breathing exercises once you get home. This will help to prevent pneumonia.  Do not do strenuous activities, such as heavy lifting, pushing, or pulling, until after your follow-up visit with your caregiver. Do not lift anything heavier than 10 lb (4.5 kg).  Talk with your caregiver about when you may return to work and your exercise routine.  Do not drive while taking prescription pain medicine. Nutrition  It is very important that you drink at least 80 oz (2,400 mL) of fluid a day.  You should stay on a liquid diet until your follow-up visit with your caregiver. Keep sugar-free, liquid items on hand, including:  Tea: hot or cold. Drink only decaffeinated for the first month.  Broths: beef, chicken, vegetable.  Others: water, sugar-free frozen ice pops, flavored water, gelatin (after 1 week).  Do not consume caffeine for 1 month. Large amounts of caffeine can cause dehydration.  A dietician may also give you specific instructions.  Follow your caregiver's recommendations about vitamins and protein requirements after surgery. Hygiene  You may shower and wash your hair 2 days after surgery. Pat incisions dry. Do not rub incisions with a washcloth or towel.  Follow your caregiver's  recommendations about baths and pools following surgery. Pain control  If a prescription medicine was given, follow your caregiver's directions.  You may feel some gas pain caused by the carbon dioxide used to inflate your abdomen during surgery. This pain can be felt in your chest, shoulder, back, or abdominal area. Moving around often is advised. Incision care You may have 4 or more small incisions. They are closed with skin adhesive strips and have a clear plastic covering over them. You may remove your dressings the number of days directed by your caregiver after surgery. Check your incisions and surrounding area daily for any redness, swelling, discoloration, fluid (drainage), or bleeding. Dark red, dried blood may appear under these coverings. This is normal. SEEK MEDICAL CARE IF:   You develop persistent nausea and vomiting.  You have pain and discomfort with swallowing.  You have pain, swelling, or warmth in the lower extremities.  You have an oral temperature above 102 F (38.9 C).  You develop chills.  Your incision sites look red, swollen, or have drainage.  Your stool is black, tarry, or maroon in color.  You are lightheaded when standing.  You have any questions or concerns. SEEK IMMEDIATE MEDICAL CARE IF:   You have chest pain.  You have severe calf pain or pain not relieved by medicine.  You develop shortness of breath or difficulty breathing.  You feel confused.  You have slurred speech.  You suddenly feel weak. MAKE SURE YOU:   Understand these instructions.  Will watch your condition.  Will get help right away if you are not doing well or get worse. Document Released:  11/03/2003 Document Revised: 07/16/2012 Document Reviewed: 08/11/2009 ExitCare Patient Information 2015 [place], [place]. This information is not intended to replace advice given to you by your health care provider. Make sure you discuss any questions you have with your health care  provider. Fatigue Fatigue is a feeling of tiredness, lack of energy, lack of motivation, or feeling tired all the time. Having enough rest, good nutrition, and reducing stress will normally reduce fatigue. Consult your caregiver if it persists. The nature of your fatigue will help your caregiver to find out its cause. The treatment is based on the cause.  CAUSES  There are many causes for fatigue. Most of the time, fatigue can be traced to one or more of your habits or routines. Most causes fit into one or more of three general areas. They are: Lifestyle problems  Sleep disturbances.  Overwork.  Physical exertion.  Unhealthy habits.  Poor eating habits or eating disorders.  Alcohol and/or drug use .  Lack of proper nutrition (malnutrition). Psychological problems  Stress and/or anxiety problems.  Depression.  Grief.  Boredom. Medical Problems or Conditions  Anemia.  Pregnancy.  Thyroid gland problems.  Recovery from major surgery.  Continuous pain.  Emphysema or asthma that is not well controlled  Allergic conditions.  Diabetes.  Infections (such as mononucleosis).  Obesity.  Sleep disorders, such as sleep apnea.  Heart failure or other heart-related problems.  Cancer.  Kidney disease.  Liver disease.  Effects of certain medicines such as antihistamines, cough and cold remedies, prescription pain medicines, heart and blood pressure medicines, drugs used for treatment of cancer, and some antidepressants. SYMPTOMS  The symptoms of fatigue include:   Lack of energy.  Lack of drive (motivation).  Drowsiness.  Feeling of indifference to the surroundings. DIAGNOSIS  The details of how you feel help guide your caregiver in finding out what is causing the fatigue. You will be asked about your present and past health condition. It is important to review all medicines that you take, including prescription and non-prescription items. A thorough exam will  be done. You will be questioned about your feelings, habits, and normal lifestyle. Your caregiver may suggest blood tests, urine tests, or other tests to look for common medical causes of fatigue.  TREATMENT  Fatigue is treated by correcting the underlying cause. For example, if you have continuous pain or depression, treating these causes will improve how you feel. Similarly, adjusting the dose of certain medicines will help in reducing fatigue.  HOME CARE INSTRUCTIONS   Try to get the required amount of good sleep every night.  Eat a healthy and nutritious diet, and drink enough water throughout the day.  Practice ways of relaxing (including yoga or meditation).  Exercise regularly.  Make plans to change situations that cause stress. Act on those plans so that stresses decrease over time. Keep your work and personal routine reasonable.  Avoid street drugs and minimize use of alcohol.  Start taking a daily multivitamin after consulting your caregiver. SEEK MEDICAL CARE IF:   You have persistent tiredness, which cannot be accounted for.  You have fever.  You have unintentional weight loss.  You have headaches.  You have disturbed sleep throughout the night.  You are feeling sad.  You have constipation.  You have dry skin.  You have gained weight.  You are taking any new or different medicines that you suspect are causing fatigue.  You are unable to sleep at night.  You develop any unusual swelling of  your legs or other parts of your body. SEEK IMMEDIATE MEDICAL CARE IF:   You are feeling confused.  Your vision is blurred.  You feel faint or pass out.  You develop severe headache.  You develop severe abdominal, pelvic, or back pain.  You develop chest pain, shortness of breath, or an irregular or fast heartbeat.  You are unable to pass a normal amount of urine.  You develop abnormal bleeding such as bleeding from the rectum or you vomit blood.  You have  thoughts about harming yourself or committing suicide.  You are worried that you might harm someone else. MAKE SURE YOU:   Understand these instructions.  Will watch your condition.  Will get help right away if you are not doing well or get worse. Document Released: 01/16/2007 Document Revised: 06/13/2011 Document Reviewed: 07/23/2013 [facility] Patient Information 2015 [place], [place]. This information is not intended to replace advice given to you by your health care provider. Make sure you discuss any questions you have with your health care provider.

## 2014-04-11 NOTE — Progress Notes (Signed)
   Subjective:    Patient ID: [name] [name], female    DOB: 06/27/1975, 39 y.o.   MRN: [id number]  HPI CO fatigue, and increased bruising, wants blood screened because had gastric sleeve surgery year ago. Has lost 90lbs, only eats small amounts, appetite is so so. Has no sxs of asthma since lost weight Uses multiple vits as recommended.  Review of Systems     Objective:   Physical Exam  Constitutional: She is oriented to person, place, and time. She appears well-nourished. No distress.  HENT:  Head: Normocephalic.  Nose: Nose normal.  Mouth/Throat: Oropharynx is clear and moist.  Eyes: Conjunctivae and EOM are normal. Pupils are equal, round, and reactive to light. No scleral icterus.  Neck: Normal range of motion. Neck supple.  Cardiovascular: Normal rate.   Pulmonary/Chest: Effort normal.  Abdominal: Soft. There is no tenderness.  Musculoskeletal: Normal range of motion.  Neurological: She is alert and oriented to person, place, and time. She exhibits normal muscle tone. Coordination normal.  Skin: No rash noted.  Psychiatric: She has a normal mood and affect. Her behavior is normal. Judgment and thought content normal.   Rare small bruises. Results for orders placed or performed in visit on 04/11/14  POCT CBC  Result Value Ref Range   WBC 5.9 4.6 - 10.2 K/uL   Lymph, poc 1.8 0.6 - 3.4   POC LYMPH PERCENT 30.6 10 - 50 %L   MID (cbc) 0.3 0 - 0.9   POC MID % 5.4 0 - 12 %M   POC Granulocyte 3.8 2 - 6.9   Granulocyte percent 64.0 37 - 80 %G   RBC 4.36 4.04 - 5.48 M/uL   Hemoglobin 14.4 12.2 - 16.2 g/dL   HCT, POC 43.8 37.7 - 47.9 %   MCV 100.5 (A) 80 - 97 fL   MCH, POC 33.1 (A) 27 - 31.2 pg   MCHC 33.0 31.8 - 35.4 g/dL   RDW, POC 12.4 %   Platelet Count, POC 203 142 - 424 K/uL   MPV 9.8 0 - 99.8 fL  POCT glucose (manual entry)  Result Value Ref Range   POC Glucose 93 70 - 99 mg/dl  POCT glycosylated hemoglobin (Hb A1C)  Result Value Ref Range   Hemoglobin  A1C 4.7   POCT UA - Microscopic Only  Result Value Ref Range   WBC, Ur, HPF, POC 1-4    RBC, urine, microscopic 0-2    Bacteria, U Microscopic trace    Mucus, UA trace    Epithelial cells, urine per micros 2-6    Crystals, Ur, HPF, POC neg    Casts, Ur, LPF, POC neg    Yeast, UA neg   POCT urinalysis dipstick  Result Value Ref Range   Color, UA yellow    Clarity, UA cloudy    Glucose, UA neg    Bilirubin, UA small    Ketones, UA trace    Spec Grav, UA >=1.030    Blood, UA small    pH, UA 6.0    Protein, UA 30    Urobilinogen, UA 0.2    Nitrite, UA neg    Leukocytes, UA Trace    Labs pending      Assessment & Plan:  Lab screen Schedule cpe 104 soon

## 2014-04-28 ENCOUNTER — Encounter: Payer: BLUE CROSS/BLUE SHIELD | Attending: Surgery | Admitting: Dietician

## 2014-04-28 DIAGNOSIS — Z713 Dietary counseling and surveillance: Secondary | ICD-10-CM | POA: Insufficient documentation

## 2014-04-28 DIAGNOSIS — Z9884 Bariatric surgery status: Secondary | ICD-10-CM | POA: Diagnosis not present

## 2014-04-28 DIAGNOSIS — Z6839 Body mass index (BMI) 39.0-39.9, adult: Secondary | ICD-10-CM | POA: Insufficient documentation

## 2014-04-28 DIAGNOSIS — E669 Obesity, unspecified: Secondary | ICD-10-CM | POA: Diagnosis not present

## 2014-04-28 DIAGNOSIS — Z4889 Encounter for other specified surgical aftercare: Secondary | ICD-10-CM | POA: Insufficient documentation

## 2014-04-28 NOTE — Patient Instructions (Addendum)
Goals:  Follow Phase 3B: High Protein + Non-Starchy Vegetables  Eat 3-6 small meals/snacks, every 3-5 hrs  Increase lean protein foods to meet 60g goal  Increase fluid intake to 64oz +  Avoid drinking 15 minutes before, during and 30 minutes after eating  Aim for to get in 150 minutes of physical activity per week (add another day if possible)  If you want to try fruit, have a couple bites with protein (try to limit to 1-2 x week)  Surgery date: 10/29/2013 Surgery type: Gastric sleeve Start weight at [facility]: 277.5 lbs on 05/08/2013 Weight today: 200.0 lbs  Weight change: 23.5  lbs Total weight loss: 77.5 lbs Weight loss goal: 200 lb weight goal achieved! New goal 150 lbs  TANITA  BODY COMP RESULTS  10/14/13 11/12/13 12/24/13 01/30/14 04/28/14   BMI (kg/m^2) 55.9 50.2 46.4 43.6 39.1   Fat Mass (lbs) 150 136.5 114.0 103.5 80.0   Fat Free Mass (lbs) 136 120.5 123.5 120.0 120.0   Total Body Water (lbs) 99.5 88.0 90.5 88.0 88.0

## 2014-04-28 NOTE — Progress Notes (Signed)
  Follow-up visit:  6 Months Post-Operative Sleeve Gastrectomy Surgery  Medical Nutrition Therapy:  Appt start time: 2671 end time:  1640.  Primary concerns today: Post-operative Bariatric Surgery Nutrition Management. Returns with a 22.5 lbs weight loss. Overall things have been going really well. Still cannot eat eggs or yogurt. Tried some fruits and they don't make her feel good (except grapes).  Has been working overtime so she hasn't been exercising as much.   Surgery date: 10/29/2013 Surgery type: Gastric sleeve Start weight at [facility]: 277.5 lbs on 05/08/2013 Weight today: 200.0 lbs  Weight change: 23.5 lbs lbs Total weight loss: 77.5lbs Weight loss goal: 200  Achieved! New goal 150 lbs  TANITA  BODY COMP RESULTS  10/14/13 11/12/13 12/24/13 01/30/14 04/28/14   BMI (kg/m^2) 55.9 50.2 46.4 43.6 39.1   Fat Mass (lbs) 150 136.5 114.0 103.5 80.0   Fat Free Mass (lbs) 136 120.5 123.5 120.0 120.0   Total Body Water (lbs) 99.5 88.0 90.5 88.0 88.0    Preferred Learning Style:   No preference indicated   Learning Readiness:   Ready  24-hr recall: B (AM): Atkins shake (15 g) Snk (AM): none L (PM): meat 2-3 oz with green beans and squash (14-21 g) Snk (PM): Atkins shake (15 g) D (PM): meat 2-3 oz with green beans and squash (14-21 g) Snk (PM): none  Fluid intake: 32 oz water, 22 oz protein shake, 12 oz decaf tea with tea = 66 oz fluid Estimated total protein intake: 64-70 g  Medications: see list  Supplementation: Taking (flintstones, citrical petites, B12)  Using straws: No Drinking while eating: No Hair loss: a little bit Carbonated beverages: tried a Sprite  N/V/D/C: some nausea when eating some foods Dumping syndrome: No  Recent physical activity:  Goes to the gym 2 x week for 60 minutes  Progress Towards Goal(s):  In progress.   Nutritional Diagnosis:  [place]-3.3 Overweight/obesity related to past poor dietary habits and physical inactivity as evidenced by patient w/  recent sleeve gastrectomy surgery following dietary guidelines for continued weight loss.    Intervention:  Nutrition education/diet advancement.  Goals:  Follow Phase 3B: High Protein + Non-Starchy Vegetables  Eat 3-6 small meals/snacks, every 3-5 hrs  Increase lean protein foods to meet 60g goal  Increase fluid intake to 64oz +  Avoid drinking 15 minutes before, during and 30 minutes after eating  Aim for to get in 150 minutes of physical activity per week (add another day if possible)  If you want to try fruit, have a couple bites with protein (try to limit to 1-2 x week)   Teaching Method Utilized:  Visual Auditory Hands on  Barriers to learning/adherence to lifestyle change: none  Demonstrated degree of understanding via:  Teach Back   Monitoring/Evaluation:  Dietary intake, exercise, and body weight. Follow up in 3 months for 9 month post-op visit.

## 2014-04-29 ENCOUNTER — Ambulatory Visit (INDEPENDENT_AMBULATORY_CARE_PROVIDER_SITE_OTHER): Payer: BLUE CROSS/BLUE SHIELD

## 2014-04-29 ENCOUNTER — Ambulatory Visit (INDEPENDENT_AMBULATORY_CARE_PROVIDER_SITE_OTHER): Payer: BLUE CROSS/BLUE SHIELD | Admitting: Family Medicine

## 2014-04-29 VITALS — BP 100/66 | HR 80 | Temp 98.2°F | Resp 20 | Ht 60.25 in | Wt 199.1 lb

## 2014-04-29 DIAGNOSIS — M25511 Pain in right shoulder: Secondary | ICD-10-CM

## 2014-04-29 DIAGNOSIS — M545 Low back pain, unspecified: Secondary | ICD-10-CM

## 2014-04-29 DIAGNOSIS — M546 Pain in thoracic spine: Secondary | ICD-10-CM

## 2014-04-29 DIAGNOSIS — T148XXA Other injury of unspecified body region, initial encounter: Secondary | ICD-10-CM

## 2014-04-29 MED ORDER — CYCLOBENZAPRINE HCL 5 MG PO TABS: 5.0000 mg | ORAL_TABLET | Freq: Three times a day (TID) | ORAL | Status: DC | PRN

## 2014-04-29 NOTE — Progress Notes (Signed)
Chief Complaint:  Chief Complaint  Patient presents with  . Back Pain    Fell on ice today at work    HPI: [name] [name] is a 39 y.o. female who is here for  Right shoulder and upper back and lower back pain, she fell at work today, this was not addressed at work comp when she went to see them  since she did not have pain until she left the work [employer]. She had a toradol injection in the office. She now has pain with movement in her upper back and lower back and right shoudler, she fell on black ice and landed on her shoulder, her hip hurts with movement.  She denies any LOC, any w/n/t or incontinence    BP Readings from Last 3 Encounters:  04/29/14 100/66  04/11/14 112/70  01/12/14 108/78   Wt Readings from Last 3 Encounters:  04/29/14 199 lb 2 oz (90.323 kg)  04/28/14 200 lb (90.719 kg)  04/11/14 201 lb 12.8 oz (91.536 kg)      Past Medical History  Diagnosis Date  . Allergy   . Asthma   . Arthritis   . Obesity   . Sleep apnea 08-16-13     mild no cpap needed per sleep study results  . Headache(784.0)     migraine  . PONV (postoperative nausea and vomiting)    Past Surgical History  Procedure Laterality Date  . Knee arthroscopy Left 9-10 yrs ago  . Foot neuroma surgery Right 2013  . Tubal ligation  2004  . Laparoscopic gastric sleeve resection [name] 10/29/2013    Procedure: LAPAROSCOPIC GASTRIC SLEEVE RESECTION AND REPAIR OF HIATAL HERNIA;  Surgeon: [name], MD;  Location: [facility];  Service: General;  Laterality: [name];  . Upper gi endoscopy  10/29/2013    Procedure: UPPER GI ENDOSCOPY;  Surgeon: [name], MD;  Location: [facility];  Service: General;;   History   Social History  . Marital Status: Single    Spouse Name: [name]    Number of Children: [name]  . Years of Education: [name]   Social History Main Topics  . Smoking status: Never Smoker   . Smokeless tobacco: Never Used  . Alcohol Use: Yes     Comment: occasionally    . Drug Use: No  . Sexual Activity: None   Other Topics Concern  . None   Social History Narrative   Family History  Problem Relation Age of Onset  . Lung disease Father   . Pulmonary embolism Father   . COPD Other   . Diabetes Other    Allergies  Allergen Reactions  . Other Other (See Comments)    Nut allergy rash, tongue swells  . Amoxicillin Rash  . Codeine Nausea And Vomiting and Rash  . Tramadol Nausea And Vomiting and Rash   Prior to Admission medications   Medication Sig Start Date End Date Taking? Authorizing Provider  albuterol (PROVENTIL HFA;VENTOLIN HFA) 108 (90 BASE) MCG/ACT inhaler Inhale 1 puff into the lungs every 6 (six) hours as needed for wheezing or shortness of breath.   Yes Historical Provider, MD  [place], [place]. (PROMISEB) CREA Apply 1 application topically every morning.   Yes Historical Provider, MD  clobetasol (TEMOVATE) 0.05 % external solution Apply 1 application topically daily as needed (Itching). To scalp 03/20/13  Yes Historical Provider, MD     ROS: The patient denies fevers, chills, night sweats, unintentional weight loss,  chest pain, palpitations, wheezing, dyspnea on exertion, nausea, vomiting, abdominal pain, dysuria, hematuria, melena, numbness, weakness, or tingling.   All other systems have been reviewed and were otherwise negative with the exception of those mentioned in the HPI and as above.    PHYSICAL EXAM: Filed Vitals:   04/29/14 1719  BP: 100/66  Pulse: 80  Temp: 98.2 F (36.8 C)  Resp: 20   Filed Vitals:   04/29/14 1719  Height: 5' 0.25" (1.53 m)  Weight: 199 lb 2 oz (90.323 kg)   Body mass index is 38.58 kg/(m^2).  General: Alert, no acute distress HEENT:  Normocephalic, atraumatic, oropharynx patent. EOMI, PERRLA Cardiovascular:  Regular rate and rhythm, no rubs murmurs or gallops.  No Carotid bruits, radial pulse intact. No pedal edema.  Respiratory: Clear to auscultation bilaterally.  No wheezes,  rales, or rhonchi.  No cyanosis, no use of accessory musculature GI: No organomegaly, abdomen is soft and non-tender, positive bowel sounds.  No masses. Skin: No rashes. Neurologic: Facial musculature symmetric. Psychiatric: Patient is appropriate throughout our interaction. Lymphatic: No cervical lymphadenopathy Musculoskeletal: Gait intact. Normal neck exam , neg spurling Thoracic and lumbar + paramsk tenderness  Decrease ROM in flexion and extension due to pain 5/5 strength, 2/2 DTRs No saddle anesthesia Straight leg negative Hip and knee exam--normal Right shoulder  No deformity, Neg ballotment Diffuse tenderness Decrease AROM, full PROM 5/5 strength, 2/2 DTRs  Neg Empty can, lift off +/- hawkins     LABS: Results for orders placed or performed in visit on 04/11/14  Lipase  Result Value Ref Range   Lipase <10 0 - 75 U/L  Comprehensive metabolic panel  Result Value Ref Range   Sodium 141 135 - 145 mEq/L   Potassium 4.5 3.5 - 5.3 mEq/L   Chloride 102 96 - 112 mEq/L   CO2 27 19 - 32 mEq/L   Glucose, Bld 85 70 - 99 mg/dL   BUN 10 6 - 23 mg/dL   Creat 0.83 0.50 - 1.10 mg/dL   Total Bilirubin 0.5 0.2 - 1.2 mg/dL   Alkaline Phosphatase 56 39 - 117 U/L   AST 19 0 - 37 U/L   ALT 20 0 - 35 U/L   Total Protein 7.1 6.0 - 8.3 g/dL   Albumin 4.4 3.5 - 5.2 g/dL   Calcium 9.5 8.4 - 10.5 mg/dL  Magnesium  Result Value Ref Range   Magnesium 1.9 1.5 - 2.5 mg/dL  Iron and TIBC  Result Value Ref Range   Iron 137 42 - 145 ug/dL   UIBC 182 125 - 400 ug/dL   TIBC 319 250 - 470 ug/dL   %SAT 43 20 - 55 %  Vitamin B12  Result Value Ref Range   Vitamin B-12 670 211 - 911 pg/mL  TSH  Result Value Ref Range   TSH 1.533 0.350 - 4.500 uIU/mL  POCT CBC  Result Value Ref Range   WBC 5.9 4.6 - 10.2 K/uL   Lymph, poc 1.8 0.6 - 3.4   POC LYMPH PERCENT 30.6 10 - 50 %L   MID (cbc) 0.3 0 - 0.9   POC MID % 5.4 0 - 12 %M   POC Granulocyte 3.8 2 - 6.9   Granulocyte percent 64.0 37 - 80  %G   RBC 4.36 4.04 - 5.48 M/uL   Hemoglobin 14.4 12.2 - 16.2 g/dL   HCT, POC 43.8 37.7 - 47.9 %   MCV 100.5 (A) 80 - 97 fL   MCH,  POC 33.1 (A) 27 - 31.2 pg   MCHC 33.0 31.8 - 35.4 g/dL   RDW, POC 12.4 %   Platelet Count, POC 203 142 - 424 K/uL   MPV 9.8 0 - 99.8 fL  POCT glucose (manual entry)  Result Value Ref Range   POC Glucose 93 70 - 99 mg/dl  POCT glycosylated hemoglobin (Hb A1C)  Result Value Ref Range   Hemoglobin A1C 4.7   POCT UA - Microscopic Only  Result Value Ref Range   WBC, Ur, HPF, POC 1-4    RBC, urine, microscopic 0-2    Bacteria, U Microscopic trace    Mucus, UA trace    Epithelial cells, urine per micros 2-6    Crystals, Ur, HPF, POC neg    Casts, Ur, LPF, POC neg    Yeast, UA neg   POCT urinalysis dipstick  Result Value Ref Range   Color, UA yellow    Clarity, UA cloudy    Glucose, UA neg    Bilirubin, UA small    Ketones, UA trace    Spec Grav, UA >=1.030    Blood, UA small    pH, UA 6.0    Protein, UA 30    Urobilinogen, UA 0.2    Nitrite, UA neg    Leukocytes, UA Trace      EKG/XRAY:   Primary read interpreted by [facility] Marin Comment at [facility]. Neg fracture  Or dislocation    ASSESSMENT/PLAN: Encounter Diagnoses  Name Primary?  . Midline low back pain without sciatica Yes  . Right shoulder pain   . Right-sided thoracic back pain   . Sprain and strain    Rx Flexeril Cannot take codeine derivatives F/u prn otherwise may want to address this with work comp prn She was given work note   Gross sideeffects, risk and benefits, and alternatives of medications d/w patient. Patient is aware that all medications have potential sideeffects and we are unable to predict every sideeffect or drug-drug interaction that may occur.  LE, [place], DO 04/29/2014 8:18 PM

## 2014-05-05 ENCOUNTER — Ambulatory Visit: Payer: Self-pay

## 2014-05-05 ENCOUNTER — Other Ambulatory Visit: Payer: Self-pay | Admitting: Occupational Medicine

## 2014-05-05 DIAGNOSIS — M25511 Pain in right shoulder: Secondary | ICD-10-CM

## 2014-05-16 ENCOUNTER — Telehealth: Payer: Self-pay

## 2014-05-16 NOTE — Telephone Encounter (Signed)
Patient left voicemail today at 3:27pm requesting records from Apr 29, 2014. Needs to sign release form. Cb# [phone].

## 2014-05-19 NOTE — Telephone Encounter (Signed)
Spoke with patient. Records/x-ray disc are ready for pick up. States she signed ROI form over the weekend.

## 2014-07-10 ENCOUNTER — Telehealth: Payer: Self-pay

## 2014-07-10 NOTE — Telephone Encounter (Signed)
Spoke with pt--she went to see dr Harlow Mares for breast reduction and they said she has to have a mammo first. Can we refer her for this?

## 2014-07-10 NOTE — Telephone Encounter (Signed)
Yes ok to refer her for this.

## 2014-07-10 NOTE — Telephone Encounter (Signed)
Pt would like to get a referral to have a mammogram  [phone]

## 2014-07-11 NOTE — Telephone Encounter (Signed)
Yes, you can still use "screening for breast cancer" since that is in fact why we need the mammogram before breast surgery.  Most insurance companies do not require referral for screening mammograms.

## 2014-07-11 NOTE — Telephone Encounter (Signed)
What code did you want to use? Can I use screening since she is only 39?

## 2014-07-14 ENCOUNTER — Other Ambulatory Visit: Payer: Self-pay | Admitting: Radiology

## 2014-07-14 DIAGNOSIS — Z1239 Encounter for other screening for malignant neoplasm of breast: Secondary | ICD-10-CM

## 2014-07-14 NOTE — Telephone Encounter (Signed)
Referral placed. lmom to inform pt.

## 2014-07-18 ENCOUNTER — Telehealth: Payer: Self-pay

## 2014-07-18 NOTE — Telephone Encounter (Signed)
Records faxed.

## 2014-07-18 NOTE — Telephone Encounter (Signed)
Judson Roch from the [facility] left voicemail today at [phone] requesting last ov notes and labs faxed to [phone] for upcoming surgery.

## 2014-07-21 ENCOUNTER — Ambulatory Visit
Admission: RE | Admit: 2014-07-21 | Discharge: 2014-07-21 | Disposition: A | Discharge status: Home / Self Care | Payer: BLUE CROSS/BLUE SHIELD | Source: Ambulatory Visit | Attending: Physician Assistant | Admitting: Physician Assistant

## 2014-07-21 DIAGNOSIS — Z1239 Encounter for other screening for malignant neoplasm of breast: Secondary | ICD-10-CM

## 2014-07-28 ENCOUNTER — Encounter: Payer: BLUE CROSS/BLUE SHIELD | Attending: Surgery | Admitting: Dietician

## 2014-07-28 ENCOUNTER — Encounter: Payer: Self-pay | Admitting: Dietician

## 2014-07-28 DIAGNOSIS — Z6837 Body mass index (BMI) 37.0-37.9, adult: Secondary | ICD-10-CM | POA: Insufficient documentation

## 2014-07-28 DIAGNOSIS — Z48815 Encounter for surgical aftercare following surgery on the digestive system: Secondary | ICD-10-CM | POA: Insufficient documentation

## 2014-07-28 DIAGNOSIS — E669 Obesity, unspecified: Secondary | ICD-10-CM | POA: Diagnosis not present

## 2014-07-28 DIAGNOSIS — Z713 Dietary counseling and surveillance: Secondary | ICD-10-CM | POA: Insufficient documentation

## 2014-07-28 DIAGNOSIS — Z9884 Bariatric surgery status: Secondary | ICD-10-CM | POA: Diagnosis not present

## 2014-07-28 NOTE — Progress Notes (Signed)
  Follow-up visit:  9 Months Post-Operative Sleeve Gastrectomy Surgery  Medical Nutrition Therapy:  Appt start time: 1630 end time:  8299.  Primary concerns today: Post-operative Bariatric Surgery Nutrition Management. Returns with a 9  lbs weight loss. Injured her shoulder in January and had surgery last week. Has been out of work since the injury. Has not been exercising. Has been taking pain meds though not sure what they are called. Starts physical therapy on Wednesday.    Surgery date: 10/29/2013 Surgery type: Gastric sleeve Start weight at [facility]: 277.5 lbs on 05/08/2013 Weight today: 191.0 lbs  Weight change: 9 lbs lbs Total weight loss: 86.5 lbs Weight loss goal: 200  Achieved! New goal 150 lbs  TANITA  BODY COMP RESULTS  10/14/13 11/12/13 12/24/13 01/30/14 04/28/14 07/28/14   BMI (kg/m^2) 55.9 50.2 46.4 43.6 39.1 37.3   Fat Mass (lbs) 150 136.5 114.0 103.5 80.0 86.0   Fat Free Mass (lbs) 136 120.5 123.5 120.0 120.0 105.0   Total Body Water (lbs) 99.5 88.0 90.5 88.0 88.0 77.0    Preferred Learning Style:   No preference indicated   Learning Readiness:   Ready  24-hr recall: B (AM): Atkins shake (15 g) Snk (AM): might have peanut butter crackers with pain meds.  L (PM): meat 2-3 oz with green beans and squash (14-21 g) Snk (PM): Atkins shake (15 g) D (PM): meat 2-3 oz with green beans and squash (14-21 g) Snk (PM): 1-2 more protein skakes  Fluid intake: 32 oz water, 33-44 oz protein shake, 12 oz decaf tea with tea = 66 oz fluid Estimated total protein intake: 64-70 g  Medications: see list  Supplementation: No taking because of surgery for the past week   Using straws: No Drinking while eating: No Hair loss: a little bit Carbonated beverages: No N/V/D/C: some nausea with medications Dumping syndrome: a bite of home chips and thought she was "going to die"  Recent physical activity:  None  Progress Towards Goal(s):  In progress.   Nutritional Diagnosis:  [place]-3.3  Overweight/obesity related to past poor dietary habits and physical inactivity as evidenced by patient w/ recent sleeve gastrectomy surgery following dietary guidelines for continued weight loss.    Intervention:  Nutrition education/diet reinforcement   Goals:  Follow Phase 3B: High Protein + Non-Starchy Vegetables  Eat 3-6 small meals/snacks, every 3-5 hrs  Increase lean protein foods to meet 60g goal  Increase fluid intake to 64oz +  Avoid drinking 15 minutes before, during and 30 minutes after eating  Aim for to get in 150 minutes of physical activity per week (add another day if possible)  If you want to try fruit, have a couple bites with protein (try to limit to 1-2 x week)  Handouts given out during appointment: High Protein Snacks   Teaching Method Utilized:  Visual Auditory Hands on  Barriers to learning/adherence to lifestyle change: none  Demonstrated degree of understanding via:  Teach Back   Monitoring/Evaluation:  Dietary intake, exercise, and body weight. Follow up in 3 months for 9 month post-op visit.

## 2014-07-28 NOTE — Patient Instructions (Addendum)
Goals:  Follow Phase 3B: High Protein + Non-Starchy Vegetables  Eat 3-6 small meals/snacks, every 3-5 hrs  Increase lean protein foods to meet 60g goal  Increase fluid intake to 64oz +  Avoid drinking 15 minutes before, during and 30 minutes after eating  Aim for to get in 150 minutes of physical activity per week (talk to physical therapist about adding some exercise)  If you want to try fruit, have a couple bites with protein (try to limit to 1-2 x week)  Have about 1 protein shake per day  Eat lean protein foods first then non starchy vegetables (limit peanut butter crackers)  Surgery date: 10/29/2013 Surgery type: Gastric sleeve Start weight at [facility]: 277.5 lbs on 05/08/2013 Weight today: 191.0 lbs  Weight change: 9 lbs lbs Total weight loss: 86.5 lbs Weight loss goal: 200  Achieved! New goal 150 lbs  TANITA  BODY COMP RESULTS  10/14/13 11/12/13 12/24/13 01/30/14 04/28/14 07/28/14   BMI (kg/m^2) 55.9 50.2 46.4 43.6 39.1 37.3   Fat Mass (lbs) 150 136.5 114.0 103.5 80.0 86.0   Fat Free Mass (lbs) 136 120.5 123.5 120.0 120.0 105.0   Total Body Water (lbs) 99.5 88.0 90.5 88.0 88.0 77.0

## 2014-08-14 ENCOUNTER — Other Ambulatory Visit: Payer: Self-pay | Admitting: Family Medicine

## 2014-10-27 ENCOUNTER — Ambulatory Visit: Payer: Self-pay | Admitting: Dietician

## 2014-10-28 ENCOUNTER — Encounter: Payer: Self-pay | Admitting: Dietician

## 2014-10-28 ENCOUNTER — Encounter: Payer: BLUE CROSS/BLUE SHIELD | Attending: Surgery | Admitting: Dietician

## 2014-10-28 DIAGNOSIS — Z48815 Encounter for surgical aftercare following surgery on the digestive system: Secondary | ICD-10-CM | POA: Insufficient documentation

## 2014-10-28 DIAGNOSIS — Z6835 Body mass index (BMI) 35.0-35.9, adult: Secondary | ICD-10-CM | POA: Diagnosis not present

## 2014-10-28 DIAGNOSIS — Z9884 Bariatric surgery status: Secondary | ICD-10-CM | POA: Diagnosis not present

## 2014-10-28 DIAGNOSIS — Z713 Dietary counseling and surveillance: Secondary | ICD-10-CM | POA: Insufficient documentation

## 2014-10-28 NOTE — Progress Notes (Signed)
  Follow-up visit:  12 Months Post-Operative Sleeve Gastrectomy Surgery  Medical Nutrition Therapy:  Appt start time: 3532 end time: 9924 .  Primary concerns today: Post-operative Bariatric Surgery Nutrition Management. Returns with a 9  lbs weight loss and 17.5 lb fat mass loss. Shoulder still aches some. Running in the water 7 x week. Can't tolerate yogurt or eggs.  Surgery date: 10/29/2013 Surgery type: Gastric sleeve Start weight at [facility]: 277.5 lbs on 05/08/2013 Weight today: 182.0 lbs  Weight change: 9 lbs, 17.5 lbs fat mass loss Total weight loss: 95.5 lbs Weight loss goal: 200  Achieved! New goal 150 lbs  TANITA  BODY COMP RESULTS  10/14/13 11/12/13 12/24/13 01/30/14 04/28/14 07/28/14 10/28/14   BMI (kg/m^2) 55.9 50.2 46.4 43.6 39.1 37.3 35.5   Fat Mass (lbs) 150 136.5 114.0 103.5 80.0 86.0 68.5   Fat Free Mass (lbs) 136 120.5 123.5 120.0 120.0 105.0 113.5   Total Body Water (lbs) 99.5 88.0 90.5 88.0 88.0 77.0 83.0    Preferred Learning Style:   No preference indicated   Learning Readiness:   Ready  24-hr recall: B (AM): Atkins shake (15 g) Snk (AM): sometimes might have cheezits or fruit or nothings L (1130 PM): meat 1.5-2 oz with green beans and squash (10-14 g) Snk (230 PM): Dannon light and fit shake or left over lunch (12 g) D (PM): meat 3 oz with green beans and squash (21 g) Snk (PM): decaf coffee with creamer  Fluid intake: 32 oz water, 11 oz protein shake, 12 oz decaf coffee with creamer = ~55 oz fluid (trying to get more) Estimated total protein intake: 58-62 g  Medications: see list  Supplementation: taking  Using straws: No Drinking while eating: No Hair loss: No Carbonated beverages: had a diet pepsi a few weeks ago (almost flat) N/V/D/C: some nausea if she eats too much or if she has fruit Dumping syndrome: No  Recent physical activity:  Running in the water 7 days/week for 30 minutes  Progress Towards Goal(s):  In progress.   Nutritional  Diagnosis:  -3.3 Overweight/obesity related to past poor dietary habits and physical inactivity as evidenced by patient w/ recent sleeve gastrectomy surgery following dietary guidelines for continued weight loss.    Intervention:  Nutrition education/diet reinforcement  Goals:  Follow Phase 3B: High Protein + Non-Starchy Vegetables  Eat 3-6 small meals/snacks, every 3-5 hrs  Increase lean protein foods to meet 60g goal  Increase fluid intake to 64oz +  Avoid drinking 15 minutes before, during and 30 minutes after eating  Aim for to get in 150 minutes of physical activity per week   If you want to try fruit, have a couple bites with protein (try to limit to 1-2 x week)  Teaching Method Utilized:  Visual Auditory Hands on  Barriers to learning/adherence to lifestyle change: none  Demonstrated degree of understanding via:  Teach Back   Monitoring/Evaluation:  Dietary intake, exercise, and body weight. Follow up in 3 months for 15 month post-op visit.

## 2014-10-28 NOTE — Patient Instructions (Addendum)
Goals:  Follow Phase 3B: High Protein + Non-Starchy Vegetables  Eat 3-6 small meals/snacks, every 3-5 hrs  Increase lean protein foods to meet 60g goal  Increase fluid intake to 64oz +  Avoid drinking 15 minutes before, during and 30 minutes after eating  Aim for to get in 150 minutes of physical activity per week   If you want to try fruit, have a couple bites with protein (try to limit to 1-2 x week)   Surgery date: 10/29/2013 Surgery type: Gastric sleeve Start weight at [facility]: 277.5 lbs on 05/08/2013 Weight today: 182.0 lbs  Weight change: 9 lbs, 17.5 lbs fat mass loss Total weight loss: 95.5 lbs Weight loss goal: 200  Achieved! New goal 150 lbs  TANITA  BODY COMP RESULTS  10/14/13 11/12/13 12/24/13 01/30/14 04/28/14 07/28/14 10/28/14   BMI (kg/m^2) 55.9 50.2 46.4 43.6 39.1 37.3 35.5   Fat Mass (lbs) 150 136.5 114.0 103.5 80.0 86.0 68.5   Fat Free Mass (lbs) 136 120.5 123.5 120.0 120.0 105.0 113.5   Total Body Water (lbs) 99.5 88.0 90.5 88.0 88.0 77.0 83.0

## 2015-01-26 ENCOUNTER — Encounter: Payer: Self-pay | Admitting: Dietician

## 2015-01-26 ENCOUNTER — Encounter: Payer: BLUE CROSS/BLUE SHIELD | Attending: Surgery | Admitting: Dietician

## 2015-01-26 DIAGNOSIS — Z713 Dietary counseling and surveillance: Secondary | ICD-10-CM | POA: Diagnosis not present

## 2015-01-26 DIAGNOSIS — Z6834 Body mass index (BMI) 34.0-34.9, adult: Secondary | ICD-10-CM | POA: Insufficient documentation

## 2015-01-26 NOTE — Progress Notes (Signed)
  Follow-up visit:  15 Months Post-Operative Sleeve Gastrectomy Surgery  Medical Nutrition Therapy:  Appt start time: 0981 end time: 1914 .  Primary concerns today: Post-operative Bariatric Surgery Nutrition Management. Returns with a 3  lbs weight loss and 13 lb fat mass loss. Feeling good with weight at this point. Body fat is 31%.  Shoulder still aches some. 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 Still can't tolerate yogurt or eggs.  Has a new schedule at work which makes eating more difficult. Having 2 protein shakes per day.   Surgery date: 10/29/2013 Surgery type: Gastric sleeve Start weight at [facility]: 277.5 lbs on 05/08/2013 Weight today: 178.5 lbs  Weight change: 3.5 lbs, 13 lbs fat mass loss Total weight loss: 99 lbs Weight loss goal: 200  Achieved! New goal 150 lbs  TANITA  BODY COMP RESULTS  10/14/13 11/12/13 12/24/13 01/30/14 04/28/14 07/28/14 10/28/14 01/26/15   BMI (kg/m^2) 55.9 50.2 46.4 43.6 39.1 37.3 35.5 34.9   Fat Mass (lbs) 150 136.5 114.0 103.5 80.0 86.0 68.5 55.5   Fat Free Mass (lbs) 136 120.5 123.5 120.0 120.0 105.0 113.5 123.0   Total Body Water (lbs) 99.5 88.0 90.5 88.0 88.0 77.0 83.0 90.0    Preferred Learning Style:   No preference indicated   Learning Readiness:   Ready  24-hr recall: B (AM): Atkins shake (15 g) Snk (AM): sometimes might have a few cheezits or [place] with peanut butter  L (PM): meat 1.5-2 oz with green beans and squash (10-14 g) Snk (230 PM): Dannon light and fit shake or left over lunch (12 g) D (PM): meat 3 oz with green beans and squash (21 g) Snk (PM): decaf coffee with creamer  Having 2 protein shakes each day  Fluid intake: 60 oz water, 22 oz protein shake, 12 oz decaf coffee with regular creamer  Estimated total protein intake: 58-62 g  Medications: see list  Supplementation: taking  Using straws: No Drinking while eating: No Hair loss: No Carbonated  beverages: once in a while but doesn't sit well  N/V/D/C: No Dumping syndrome: No  Recent physical activity:  None lately  Progress Towards Goal(s):  In progress.   Nutritional Diagnosis:  [place]-3.3 Overweight/obesity related to past poor dietary habits and physical inactivity as evidenced by patient w/ recent sleeve gastrectomy surgery following dietary guidelines for continued weight loss.    Intervention:  Nutrition education/diet reinforcement  Goals:  Follow Phase 3B: High Protein + Non-Starchy Vegetables  Eat 3-6 small meals/snacks, every 3-5 hrs  Increase lean protein foods to meet 60g goal  Increase fluid intake to 64oz +  Avoid drinking 15 minutes before, during and 30 minutes after eating  Aim for to get in 150 minutes of physical activity per week when schedule allows  Continue to eat protein foods first or have a protein shake   Teaching Method Utilized:  Visual Auditory Hands on  Barriers to learning/adherence to lifestyle change: none  Demonstrated degree of understanding via:  Teach Back   Monitoring/Evaluation:  Dietary intake, exercise, and body weight. Follow up in 6 months for 21 month post-op visit.

## 2015-01-26 NOTE — Patient Instructions (Addendum)
Goals:  Follow Phase 3B: High Protein + Non-Starchy Vegetables  Eat 3-6 small meals/snacks, every 3-5 hrs  Increase lean protein foods to meet 60g goal  Increase fluid intake to 64oz +  Avoid drinking 15 minutes before, during and 30 minutes after eating  Aim for to get in 150 minutes of physical activity per week when schedule allows  Continue to eat protein foods first or have a protein shake  Surgery date: 10/29/2013 Surgery type: Gastric sleeve Start weight at [facility]: 277.5 lbs on 05/08/2013 Weight today: 178.5 lbs  Weight change: 3.5 lbs, 13 lbs fat mass loss Total weight loss: 99 lbs  TANITA  BODY COMP RESULTS  10/14/13 11/12/13 12/24/13 01/30/14 04/28/14 07/28/14 10/28/14 01/26/15   BMI (kg/m^2) 55.9 50.2 46.4 43.6 39.1 37.3 35.5 34.9   Fat Mass (lbs) 150 136.5 114.0 103.5 80.0 86.0 68.5 55.5   Fat Free Mass (lbs) 136 120.5 123.5 120.0 120.0 105.0 113.5 123.0   Total Body Water (lbs) 99.5 88.0 90.5 88.0 88.0 77.0 83.0 90.0

## 2015-02-11 ENCOUNTER — Ambulatory Visit (INDEPENDENT_AMBULATORY_CARE_PROVIDER_SITE_OTHER): Payer: BLUE CROSS/BLUE SHIELD | Admitting: Family Medicine

## 2015-02-11 VITALS — BP 118/80 | HR 74 | Temp 97.7°F | Resp 16 | Ht 60.0 in | Wt 182.6 lb

## 2015-02-11 DIAGNOSIS — Z23 Encounter for immunization: Secondary | ICD-10-CM | POA: Diagnosis not present

## 2015-02-11 DIAGNOSIS — R3915 Urgency of urination: Secondary | ICD-10-CM

## 2015-02-11 LAB — POCT URINALYSIS DIP (MANUAL ENTRY)
BILIRUBIN UA: NEGATIVE
BILIRUBIN UA: NEGATIVE
GLUCOSE UA: NEGATIVE
Nitrite, UA: NEGATIVE
Protein Ur, POC: NEGATIVE
Spec Grav, UA: 1.02
Urobilinogen, UA: 0.2
pH, UA: 6.5

## 2015-02-11 LAB — POC MICROSCOPIC URINALYSIS (UMFC)

## 2015-02-11 MED ORDER — PHENAZOPYRIDINE HCL 100 MG PO TABS: 100.0000 mg | ORAL_TABLET | Freq: Three times a day (TID) | ORAL | Status: DC | PRN

## 2015-02-11 MED ORDER — NITROFURANTOIN MONOHYD MACRO 100 MG PO CAPS: 100.0000 mg | ORAL_CAPSULE | Freq: Two times a day (BID) | ORAL | Status: DC

## 2015-02-11 NOTE — Patient Instructions (Signed)

## 2015-02-11 NOTE — Progress Notes (Signed)
Subjective:    Patient ID: [name] [name], female    DOB: [birth year], 39 y.o.   MRN: [id number]  02/11/2015  Dysuria; Abdominal Pain; and Immunizations   HPI This 39 y.o. female presents for evaluation of dysuria, abdominal pain.   Lower abdominal pressure onset this morning.  No fever/chills/sweats.  +nausea; no vomiting.  No diarrhea or constipation.  No dysuria, + urgency, frequency, hesitancy, no hematuria.  No history of kidney stones.  No pregnancy.  S/p BTL.  Regular menses.  No vaginal discharge or irritation; same partner for 5 years. No concern about STDs.  Usually gets UTI after change in work shift; recently had change in work shift.  S/p gastric sleeve; has lost 115 pounds; decreased thirst.    Requesting flu vaccine.  Last Tetanus unknown.   Review of Systems  Constitutional: Negative for fever, chills, diaphoresis and fatigue.  Eyes: Negative for visual disturbance.  Respiratory: Negative for cough and shortness of breath.   Cardiovascular: Negative for chest pain, palpitations and leg swelling.  Gastrointestinal: Positive for nausea. Negative for vomiting, abdominal pain, diarrhea and constipation.  Endocrine: Negative for cold intolerance, heat intolerance, polydipsia, polyphagia and polyuria.  Genitourinary: Positive for urgency and flank pain. Negative for dysuria, frequency, hematuria, vaginal bleeding, vaginal discharge, genital sores, vaginal pain, menstrual problem and pelvic pain.  Neurological: Negative for dizziness, tremors, seizures, syncope, facial asymmetry, speech difficulty, weakness, light-headedness, numbness and headaches.    Past Medical History  Diagnosis Date  . Allergy   . Asthma   . Arthritis   . Obesity   . Sleep apnea 08-16-13     mild no cpap needed per sleep study results  . Headache(784.0)     migraine  . PONV (postoperative nausea and vomiting)    Past Surgical History  Procedure Laterality Date  . Knee arthroscopy Left 9-10  yrs ago  . Foot neuroma surgery Right 2013  . Tubal ligation  2004  . Laparoscopic gastric sleeve resection [name] 10/29/2013    Procedure: LAPAROSCOPIC GASTRIC SLEEVE RESECTION AND REPAIR OF HIATAL HERNIA;  Surgeon: [name], MD;  Location: [facility];  Service: General;  Laterality: [name];  . Upper gi endoscopy  10/29/2013    Procedure: UPPER GI ENDOSCOPY;  Surgeon: [name], MD;  Location: [facility];  Service: General;;  . Shoulder surgery     Allergies  Allergen Reactions  . Other Other (See Comments)    Nut allergy rash, tongue swells  . Amoxicillin Rash  . Codeine Nausea And Vomiting and Rash  . Tramadol Nausea And Vomiting and Rash    Social History   Social History  . Marital Status: Single    Spouse Name: [name]  . Number of Children: [name]  . Years of Education: [name]   Occupational History  . Not on file.   Social History Main Topics  . Smoking status: Never Smoker   . Smokeless tobacco: Never Used  . Alcohol Use: Yes     Comment: occasionally  . Drug Use: No  . Sexual Activity: Not on file   Other Topics Concern  . Not on file   Social History Narrative   Family History  Problem Relation Age of Onset  . Lung disease Father   . Pulmonary embolism Father   . COPD Other   . Diabetes Other        Objective:    BP 118/80 mmHg  Pulse 74  Temp(Src) 97.7 F (36.5 C) (Oral)  Resp  16  Ht 5' (1.524 m)  Wt 182 lb 9.6 oz (82.827 kg)  BMI 35.66 kg/m2  SpO2 98%  LMP 02/03/2015 Physical Exam  Constitutional: She is oriented to person, place, and time. She appears well-developed and well-nourished. No distress.  HENT:  Head: Normocephalic and atraumatic.  Eyes: Conjunctivae are normal. Pupils are equal, round, and reactive to light.  Neck: Normal range of motion. Neck supple.  Cardiovascular: Normal rate, regular rhythm and normal heart sounds.  Exam reveals no gallop and no friction rub.   No murmur heard. Pulmonary/Chest: Effort normal and breath sounds  normal. She has no wheezes. She has no rales.  Abdominal: Soft. Bowel sounds are normal. She exhibits no distension and no mass. There is no hepatosplenomegaly. There is tenderness in the suprapubic area. There is no rigidity, no rebound, no guarding and no CVA tenderness. No hernia.  Neurological: She is alert and oriented to person, place, and time.  Skin: Skin is warm and dry. No rash noted. She is not diaphoretic.  Psychiatric: She has a normal mood and affect. Her behavior is normal.  Nursing note and vitals reviewed.  Results for orders placed or performed in visit on 04/11/14  Lipase  Result Value Ref Range   Lipase <10 0 - 75 U/L  Comprehensive metabolic panel  Result Value Ref Range   Sodium 141 135 - 145 mEq/L   Potassium 4.5 3.5 - 5.3 mEq/L   Chloride 102 96 - 112 mEq/L   CO2 27 19 - 32 mEq/L   Glucose, Bld 85 70 - 99 mg/dL   BUN 10 6 - 23 mg/dL   Creat 0.83 0.50 - 1.10 mg/dL   Total Bilirubin 0.5 0.2 - 1.2 mg/dL   Alkaline Phosphatase 56 39 - 117 U/L   AST 19 0 - 37 U/L   ALT 20 0 - 35 U/L   Total Protein 7.1 6.0 - 8.3 g/dL   Albumin 4.4 3.5 - 5.2 g/dL   Calcium 9.5 8.4 - 10.5 mg/dL  Magnesium  Result Value Ref Range   Magnesium 1.9 1.5 - 2.5 mg/dL  Iron and TIBC  Result Value Ref Range   Iron 137 42 - 145 ug/dL   UIBC 182 125 - 400 ug/dL   TIBC 319 250 - 470 ug/dL   %SAT 43 20 - 55 %  Vitamin B12  Result Value Ref Range   Vitamin B-12 670 211 - 911 pg/mL  TSH  Result Value Ref Range   TSH 1.533 0.350 - 4.500 uIU/mL  POCT CBC  Result Value Ref Range   WBC 5.9 4.6 - 10.2 K/uL   Lymph, poc 1.8 0.6 - 3.4   POC LYMPH PERCENT 30.6 10 - 50 %L   MID (cbc) 0.3 0 - 0.9   POC MID % 5.4 0 - 12 %M   POC Granulocyte 3.8 2 - 6.9   Granulocyte percent 64.0 37 - 80 %G   RBC 4.36 4.04 - 5.48 M/uL   Hemoglobin 14.4 12.2 - 16.2 g/dL   HCT, POC 43.8 37.7 - 47.9 %   MCV 100.5 (A) 80 - 97 fL   MCH, POC 33.1 (A) 27 - 31.2 pg   MCHC 33.0 31.8 - 35.4 g/dL   RDW, POC 12.4 %    Platelet Count, POC 203 142 - 424 K/uL   MPV 9.8 0 - 99.8 fL  POCT glucose (manual entry)  Result Value Ref Range   POC Glucose 93 70 - 99 mg/dl  POCT glycosylated hemoglobin (Hb A1C)  Result Value Ref Range   Hemoglobin A1C 4.7   POCT UA - Microscopic Only  Result Value Ref Range   WBC, Ur, HPF, POC 1-4    RBC, urine, microscopic 0-2    Bacteria, U Microscopic trace    Mucus, UA trace    Epithelial cells, urine per micros 2-6    Crystals, Ur, HPF, POC neg    Casts, Ur, LPF, POC neg    Yeast, UA neg   POCT urinalysis dipstick  Result Value Ref Range   Color, UA yellow    Clarity, UA cloudy    Glucose, UA neg    Bilirubin, UA small    Ketones, UA trace    Spec Grav, UA >=1.030    Blood, UA small    pH, UA 6.0    Protein, UA 30    Urobilinogen, UA 0.2    Nitrite, UA neg    Leukocytes, UA Trace    TDAP AND INFLUENZA VACCINES ADMINISTERED.    Assessment & Plan:   1. Urinary urgency   2. Need for prophylactic vaccination and inoculation against influenza   3. Need for Tdap vaccination     1. Urinary urgency: New. Send urine culture; rx for Macrobid and pyridium provided.  RTC for fever/vomiting. 2.  S/p influenza vaccine. 3.  S/p TDAP   Orders Placed This Encounter  Procedures  . Urine culture  . Flu Vaccine QUAD 36+ mos IM  . Tdap vaccine greater than or equal to 7yo IM  . POCT urinalysis dipstick  . POCT Microscopic Urinalysis ([facility])   No orders of the defined types were placed in this encounter.    No Follow-up on file.   Nalu Troublefield Elayne Guerin, M.D. [facility] [address] [place], [place]  [address] [phone] phone [phone] fax

## 2015-02-13 LAB — URINE CULTURE

## 2015-02-23 ENCOUNTER — Telehealth: Payer: Self-pay | Admitting: Family Medicine

## 2015-02-23 NOTE — Telephone Encounter (Signed)
Notes Recorded by Wardell Honour, MD on 02/19/2015 at 8:40 AM Urine culture negative; no evidence of urinary tract infection. How is patient feeling?  Gave patient this message from Dr. Tamala Julian. She understood and states that she is feeling better.

## 2015-03-03 ENCOUNTER — Other Ambulatory Visit: Payer: Self-pay | Admitting: Family Medicine

## 2015-03-06 ENCOUNTER — Ambulatory Visit (INDEPENDENT_AMBULATORY_CARE_PROVIDER_SITE_OTHER): Payer: BLUE CROSS/BLUE SHIELD | Admitting: Emergency Medicine

## 2015-03-06 VITALS — BP 110/76 | HR 81 | Temp 98.4°F | Resp 16 | Ht 61.0 in | Wt 181.0 lb

## 2015-03-06 DIAGNOSIS — J4521 Mild intermittent asthma with (acute) exacerbation: Secondary | ICD-10-CM

## 2015-03-06 MED ORDER — ALBUTEROL SULFATE (2.5 MG/3ML) 0.083% IN NEBU
2.5000 mg | INHALATION_SOLUTION | Freq: Once | RESPIRATORY_TRACT | Status: AC
  Administered 2015-03-06: 2.5 mg via RESPIRATORY_TRACT

## 2015-03-06 MED ORDER — MOMETASONE FUROATE 220 MCG/INH IN AEPB: 1.0000 | INHALATION_SPRAY | Freq: Every day | RESPIRATORY_TRACT | Status: DC

## 2015-03-06 MED ORDER — ALBUTEROL SULFATE HFA 108 (90 BASE) MCG/ACT IN AERS: 1.0000 | INHALATION_SPRAY | Freq: Four times a day (QID) | RESPIRATORY_TRACT | Status: DC | PRN

## 2015-03-06 NOTE — Patient Instructions (Signed)
Asthma, Adult Asthma is a recurring condition in which the airways tighten and narrow. Asthma can make it difficult to breathe. It can cause coughing, wheezing, and shortness of breath. Asthma episodes, also called asthma attacks, range from minor to life-threatening. Asthma cannot be cured, but medicines and lifestyle changes can help control it. CAUSES Asthma is believed to be caused by inherited (genetic) and environmental factors, but its exact cause is unknown. Asthma may be triggered by allergens, lung infections, or irritants in the air. Asthma triggers are different for each person. Common triggers include:   Animal dander.  Dust mites.  Cockroaches.  Pollen from trees or grass.  Mold.  Smoke.  Air pollutants such as dust, household cleaners, hair sprays, aerosol sprays, paint fumes, strong chemicals, or strong odors.  Cold air, weather changes, and winds (which increase molds and pollens in the air).  Strong emotional expressions such as crying or laughing hard.  Stress.  Certain medicines (such as aspirin) or types of drugs (such as beta-blockers).  Sulfites in foods and drinks. Foods and drinks that may contain sulfites include dried fruit, potato chips, and sparkling grape juice.  Infections or inflammatory conditions such as the flu, a cold, or an inflammation of the nasal membranes (rhinitis).  Gastroesophageal reflux disease (GERD).  Exercise or strenuous activity. SYMPTOMS Symptoms may occur immediately after asthma is triggered or many hours later. Symptoms include:  Wheezing.  Excessive nighttime or early morning coughing.  Frequent or severe coughing with a common cold.  Chest tightness.  Shortness of breath. DIAGNOSIS  The diagnosis of asthma is made by a review of your medical history and a physical exam. Tests may also be performed. These may include:  Lung function studies. These tests show how much air you breathe in and out.  Allergy  tests.  Imaging tests such as X-rays. TREATMENT  Asthma cannot be cured, but it can usually be controlled. Treatment involves identifying and avoiding your asthma triggers. It also involves medicines. There are 2 classes of medicine used for asthma treatment:   Controller medicines. These prevent asthma symptoms from occurring. They are usually taken every day.  Reliever or rescue medicines. These quickly relieve asthma symptoms. They are used as needed and provide short-term relief. Your health care provider will help you create an asthma action plan. An asthma action plan is a written plan for managing and treating your asthma attacks. It includes a list of your asthma triggers and how they may be avoided. It also includes information on when medicines should be taken and when their dosage should be changed. An action plan may also involve the use of a device called a peak flow meter. A peak flow meter measures how well the lungs are working. It helps you monitor your condition. HOME CARE INSTRUCTIONS   Take medicines only as directed by your health care provider. Speak with your health care provider if you have questions about how or when to take the medicines.  Use a peak flow meter as directed by your health care provider. Record and keep track of readings.  Understand and use the action plan to help minimize or stop an asthma attack without needing to seek medical care.  Control your home environment in the following ways to help prevent asthma attacks:  Do not smoke. Avoid being exposed to secondhand smoke.  Change your heating and air conditioning filter regularly.  Limit your use of fireplaces and wood stoves.  Get rid of pests (such as roaches  and mice) and their droppings.  Throw away plants if you see mold on them.  Clean your floors and dust regularly. Use unscented cleaning products.  Try to have someone else vacuum for you regularly. Stay out of rooms while they are  being vacuumed and for a short while afterward. If you vacuum, use a dust mask from a hardware store, a double-layered or microfilter vacuum cleaner bag, or a vacuum cleaner with a HEPA filter.  Replace carpet with wood, tile, or vinyl flooring. Carpet can trap dander and dust.  Use allergy-proof pillows, mattress covers, and box spring covers.  Wash bed sheets and blankets every week in hot water and dry them in a dryer.  Use blankets that are made of polyester or cotton.  Clean bathrooms and kitchens with bleach. If possible, have someone repaint the walls in these rooms with mold-resistant paint. Keep out of the rooms that are being cleaned and painted.  Wash hands frequently. SEEK MEDICAL CARE IF:   You have wheezing, shortness of breath, or a cough even if taking medicine to prevent attacks.  The colored mucus you cough up (sputum) is thicker than usual.  Your sputum changes from clear or white to yellow, green, gray, or bloody.  You have any problems that may be related to the medicines you are taking (such as a rash, itching, swelling, or trouble breathing).  You are using a reliever medicine more than 2-3 times per week.  Your peak flow is still at 50-79% of your personal best after following your action plan for 1 hour.  You have a fever. SEEK IMMEDIATE MEDICAL CARE IF:   You seem to be getting worse and are unresponsive to treatment during an asthma attack.  You are short of breath even at rest.  You get short of breath when doing very little physical activity.  You have difficulty eating, drinking, or talking due to asthma symptoms.  You develop chest pain.  You develop a fast heartbeat.  You have a bluish color to your lips or fingernails.  You are light-headed, dizzy, or faint.  Your peak flow is less than 50% of your personal best.   This information is not intended to replace advice given to you by your health care provider. Make sure you discuss any  questions you have with your health care provider.   Document Released: 03/21/2005 Document Revised: 12/10/2014 Document Reviewed: 10/18/2012 Elsevier Interactive Patient Education 2016 Elsevier Inc.  

## 2015-03-06 NOTE — Progress Notes (Addendum)
This chart was scribed for Arlyss Queen, MD by [name], Medical Scribe. This patient was seen in Room 10 and the patient's care was started 4:10 PM.  Chief Complaint:  Chief Complaint  Patient presents with  . Asthma    meds refill, proventil     HPI: [name] [name] is a 39 y.o. female who reports to [facility] today complaining of asthma flare up.  She hasn't had flare ups since bariatrics surgery, which was done 1.5 years ago. She was in here every couple of weeks prior to surgery. She believes the flare up is due to the wind picking up. She denies smoking.   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   Past Medical History  Diagnosis Date  . Allergy   . Asthma   . Arthritis   . Obesity   . Sleep apnea 08-16-13     mild no cpap needed per sleep study results  . Headache(784.0)     migraine  . PONV (postoperative nausea and vomiting)    Past Surgical History  Procedure Laterality Date  . Knee arthroscopy Left 9-10 yrs ago  . Foot neuroma surgery Right 2013  . Tubal ligation  2004  . Laparoscopic gastric sleeve resection [name] 10/29/2013    Procedure: LAPAROSCOPIC GASTRIC SLEEVE RESECTION AND REPAIR OF HIATAL HERNIA;  Surgeon: [name], MD;  Location: [facility];  Service: General;  Laterality: [name];  . Upper gi endoscopy  10/29/2013    Procedure: UPPER GI ENDOSCOPY;  Surgeon: [name], MD;  Location: [facility];  Service: General;;  . Shoulder surgery     Social History   Social History  . Marital Status: Single    Spouse Name: [name]  . Number of Children: [name]  . Years of Education: [name]   Social History Main Topics  . Smoking status: Never Smoker   . Smokeless tobacco: Never Used  . Alcohol Use: Yes     Comment: occasionally  . Drug Use: No  . Sexual Activity: Not Asked   Other Topics Concern  . None   Social History Narrative   Family History  Problem Relation Age of Onset  . Lung disease Father   . Pulmonary embolism Father   . COPD Other     . Diabetes Other    Allergies  Allergen Reactions  . Other Other (See Comments)    Nut allergy rash, tongue swells  . Amoxicillin Rash  . Codeine Nausea And Vomiting and Rash  . Tramadol Nausea And Vomiting and Rash   Prior to Admission medications   Medication Sig Start Date End Date Taking? Authorizing Provider  albuterol (PROVENTIL HFA;VENTOLIN HFA) 108 (90 BASE) MCG/ACT inhaler Inhale 1 puff into the lungs every 6 (six) hours as needed for wheezing or shortness of breath.   Yes Historical Provider, MD  [place], [place]. (PROMISEB) CREA Apply 1 application topically every morning.   Yes Historical Provider, MD  clobetasol (TEMOVATE) 0.05 % external solution Apply 1 application topically daily as needed (Itching). To scalp 03/20/13  Yes Historical Provider, MD  nitrofurantoin, macrocrystal-monohydrate, (MACROBID) 100 MG capsule Take 1 capsule (100 mg total) by mouth 2 (two) times daily. 02/11/15  Yes Wardell Honour, MD  cyclobenzaprine (FLEXERIL) 5 MG tablet TAKE 1 TABLET BY MOUTH 3 (THREE) TIMES DAILY AS NEEDED FOR MUSCLE SPASMS. Patient not taking: Reported on 02/11/2015 08/14/14   Thao P Le, DO  phenazopyridine (PYRIDIUM) 100 MG tablet Take 1 tablet (100 mg total)  by mouth 3 (three) times daily as needed for pain. Patient not taking: Reported on 03/06/2015 02/11/15   Wardell Honour, MD     ROS:  Constitutional: negative for chills, fever, night sweats, weight changes, or fatigue  HEENT: negative for vision changes, hearing loss, congestion, rhinorrhea, ST, epistaxis, or sinus pressure Cardiovascular: negative for chest pain or palpitations Respiratory: negative for hemoptysis, shortness of breath, or cough; positive for wheezing Abdominal: negative for abdominal pain, nausea, vomiting, diarrhea, or constipation Dermatological: negative for rash Neurologic: negative for headache, dizziness, or syncope All other systems reviewed and are otherwise negative with the  exception to those above and in the HPI.  PHYSICAL EXAM: Filed Vitals:   03/06/15 1545  BP: 110/76  Pulse: 81  Temp: 98.4 F (36.9 C)  Resp: 16   Body mass index is 34.22 kg/(m^2).   General: Alert, no acute distress HEENT:  Normocephalic, atraumatic, oropharynx patent. Eye: Juliette Mangle [facility] Cardiovascular:  Regular rate and rhythm, no rubs murmurs or gallops.  No Carotid bruits, radial pulse intact. No pedal edema.  Respiratory: No rales, or rhonchi. Rare expiratory wheeze, breath sounds symmetrical No cyanosis, no use of accessory musculature Abdominal: No organomegaly, abdomen is soft and non-tender, positive bowel sounds. No masses. Musculoskeletal: Gait intact. No edema, tenderness Skin: No rashes. Neurologic: Facial musculature symmetric. Psychiatric: Patient acts appropriately throughout our interaction.  Lymphatic: No cervical or submandibular lymphadenopathy Genitourinary/Anorectal: No acute findings Peak flow 340  Meds ordered this encounter  Medications  . albuterol (PROVENTIL) (2.5 MG/3ML) 0.083% nebulizer solution 2.5 mg    Sig:   . albuterol (PROVENTIL HFA;VENTOLIN HFA) 108 (90 BASE) MCG/ACT inhaler    Sig: Inhale 1 puff into the lungs every 6 (six) hours as needed for wheezing or shortness of breath.    Dispense:  1 Inhaler    Refill:  5  . mometasone (ASMANEX) 220 MCG/INH inhaler    Sig: Inhale 1 puff into the lungs daily. Rinse mouth with water after using.    Dispense:  1 Inhaler    Refill:  11    LABS:    EKG/XRAY:   Primary read interpreted by Dr. Everlene Farrier at [facility].    ASSESSMENT/PLAN: Patient placed on Asmanex and given a refill on her albuterol inhaler.I personally performed the services described in this documentation, which was scribed in my presence. The recorded information has been reviewed and is accurate. She did feel better after her nebulizer treatment. By signing my name below, I, [name], attest that this documentation has been  prepared under the direction and in the presence of Arlyss Queen, MD. Electronically Signed: [name], [place]. 03/06/2015 , 4:08 PM .    Gross sideeffects, risk and benefits, and alternatives of medications d/w patient. Patient is aware that all medications have potential sideeffects and we are unable to predict every sideeffect or drug-drug interaction that may occur.  Arlyss Queen MD 03/06/2015 4:08 PM

## 2015-05-20 ENCOUNTER — Ambulatory Visit (INDEPENDENT_AMBULATORY_CARE_PROVIDER_SITE_OTHER): Payer: BLUE CROSS/BLUE SHIELD | Admitting: Family Medicine

## 2015-05-20 VITALS — BP 128/88 | HR 94 | Temp 98.1°F | Resp 16 | Ht 60.0 in | Wt 184.4 lb

## 2015-05-20 DIAGNOSIS — J0100 Acute maxillary sinusitis, unspecified: Secondary | ICD-10-CM

## 2015-05-20 MED ORDER — CLINDAMYCIN HCL 300 MG PO CAPS: 300.0000 mg | ORAL_CAPSULE | Freq: Three times a day (TID) | ORAL | Status: DC

## 2015-05-20 NOTE — Progress Notes (Signed)
[facility] [address] [place] [address] [phone]  Date:  05/20/2015   Name:  [name] [name]   DOB:  11/13/1975   MRN:  [id number]  PCP:  Leotis Pain, DO    Chief Complaint: Sinusitis   History of Present Illness:  [name] [name] is a 40 y.o. very pleasant female patient who presents with the following:  History of asthma, obesity s/p weight loss surgery.  Here today with complaint of illness- she has noted sx of right sided facial pain into her gums and cheek Sx started 4 days ago with a "tickly throat"- then she developed sinus pressure, got worse yesterday. The sx have now settled into her right teeth, cheek and gums She does have pain with chewing on the right side, and her throat hurts on the right only  She has a bit of a cough  She has not noted a fever, no GI symptoms   Her asthma does tend to be worse with weather changes like we have experienced recently She is on asmanex and she has albuterol to use as needed  Her asthma sx are not severe or worrisome to her  Patient Active Problem List   Diagnosis Date Noted  . 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 Gallstones 10/15/2013  . Morbid obesity ([place]) 09/21/2012  . Asthma 05/14/2011    Past Medical History  Diagnosis Date  . Allergy   . Asthma   . Arthritis   . Obesity   . Sleep apnea 08-16-13     mild no cpap needed per sleep study results  . Headache(784.0)     migraine  . PONV (postoperative nausea and vomiting)     Past Surgical History  Procedure Laterality Date  . Knee arthroscopy Left 9-10 yrs ago  . Foot neuroma surgery Right 2013  . Tubal ligation  2004  . Laparoscopic gastric sleeve resection [name] 10/29/2013    Procedure: LAPAROSCOPIC GASTRIC SLEEVE RESECTION AND REPAIR OF HIATAL HERNIA;  Surgeon: [name], MD;  Location: [facility];  Service: General;  Laterality: [name];  . Upper gi endoscopy  10/29/2013    Procedure: UPPER GI  ENDOSCOPY;  Surgeon: [name], MD;  Location: [facility];  Service: General;;  . Shoulder surgery      Social History  Substance Use Topics  . Smoking status: Never Smoker   . Smokeless tobacco: Never Used  . Alcohol Use: Yes     Comment: occasionally    Family History  Problem Relation Age of Onset  . Lung disease Father   . Pulmonary embolism Father   . COPD Other   . Diabetes Other     Allergies  Allergen Reactions  . Other Other (See Comments)    Nut allergy rash, tongue swells  . Amoxicillin Rash  . Codeine Nausea And Vomiting and Rash  . Tramadol Nausea And Vomiting and Rash    Medication list has been reviewed and updated.  Current Outpatient Prescriptions on File Prior to Visit  Medication Sig Dispense Refill  . albuterol (PROVENTIL HFA;VENTOLIN HFA) 108 (90 BASE) MCG/ACT inhaler Inhale 1 puff into the lungs every 6 (six) hours as needed for wheezing or shortness of breath. 1 Inhaler 5  . Antiseborrheic Products, Misc. (PROMISEB) CREA Apply 1 application topically every morning.    . clobetasol (TEMOVATE) 0.05 % external solution Apply 1 application topically daily as needed (Itching). To scalp    . mometasone (  ASMANEX) 220 MCG/INH inhaler Inhale 1 puff into the lungs daily. Rinse mouth with water after using. 1 Inhaler 11  . cyclobenzaprine (FLEXERIL) 5 MG tablet TAKE 1 TABLET BY MOUTH 3 (THREE) TIMES DAILY AS NEEDED FOR MUSCLE SPASMS. (Patient not taking: Reported on 02/11/2015) 30 tablet 0  . nitrofurantoin, macrocrystal-monohydrate, (MACROBID) 100 MG capsule Take 1 capsule (100 mg total) by mouth 2 (two) times daily. (Patient not taking: Reported on 05/20/2015) 14 capsule 0  . phenazopyridine (PYRIDIUM) 100 MG tablet Take 1 tablet (100 mg total) by mouth 3 (three) times daily as needed for pain. (Patient not taking: Reported on 03/06/2015) 10 tablet 0   Current Facility-Administered Medications on File Prior to Visit  Medication Dose Route Frequency Provider  Last Rate Last Dose  . albuterol (PROVENTIL) (2.5 MG/3ML) 0.083% nebulizer solution 2.5 mg  2.5 mg Nebulization Once Thao P Le, DO        Review of Systems:  As per HPI- otherwise negative.   Physical Examination: Filed Vitals:   05/20/15 1538  BP: 128/88  Pulse: 94  Temp: 98.1 F (36.7 C)  Resp: 16   Filed Vitals:   05/20/15 1538  Height: 5' (1.524 m)  Weight: 184 lb 6.4 oz (83.643 kg)   Body mass index is 36.01 kg/(m^2). Ideal Body Weight: Weight in (lb) to have BMI = 25: 127.7  GEN: WDWN, NAD, Non-toxic, A & O x 3, obese, here today with her school- age daugher HEENT: Atraumatic, Normocephalic. Neck supple. No masses, No LAD.  Bilateral TM wnl, oropharynx normal.  PEERL,EOMI.  She has several missing teeth but teeth that are present are in good repair.  She has tenderness along the gum line that would correspond to approx #3/4.  No abscess is noted.  The buccal tissue is also a bit tender in this area Ears and Nose: No external deformity. CV: RRR, No M/G/R. No JVD. No thrill. No extra heart sounds. PULM: CTA B, no wheezes, crackles, rhonchi. No retractions. No resp. distress. No accessory muscle use. EXTR: No c/c/e NEURO Normal gait.  PSYCH: Normally interactive. Conversant. Not depressed or anxious appearing.  Calm demeanor.    Assessment and Plan: Acute maxillary sinusitis, recurrence not specified - Plan: clindamycin (CLEOCIN) 300 MG capsule  Here today with pain and possible infection in the right frontal sinuses or perhaps the gums/ salivary gland.  In any case will treat her with clindamycin as below.  She has no history of c diff and is given samples of probiotic gummies to use while on abx Encouraged sour candies to increase salivation She will let me know if not better soon  Meds ordered this encounter  Medications  . clindamycin (CLEOCIN) 300 MG capsule    Sig: Take 1 capsule (300 mg total) by mouth 3 (three) times daily.    Dispense:  30 capsule     Refill:  0    Signed Lamar Blinks, MD

## 2015-05-20 NOTE — Patient Instructions (Signed)
Use the clindamycin antibiotic as directed for your sinuses or tooth/ gum infection Let me know if you are not getting better soon! If you are doing well you can stop the antibiotic after one week- if needed continue for 10 days

## 2015-06-04 ENCOUNTER — Encounter: Payer: Self-pay | Admitting: Family Medicine

## 2015-06-04 ENCOUNTER — Ambulatory Visit (INDEPENDENT_AMBULATORY_CARE_PROVIDER_SITE_OTHER): Payer: BLUE CROSS/BLUE SHIELD | Admitting: Family Medicine

## 2015-06-04 VITALS — BP 122/82 | HR 80 | Temp 98.9°F | Resp 17 | Ht 60.0 in | Wt 189.0 lb

## 2015-06-04 DIAGNOSIS — N898 Other specified noninflammatory disorders of vagina: Secondary | ICD-10-CM | POA: Diagnosis not present

## 2015-06-04 DIAGNOSIS — B373 Candidiasis of vulva and vagina: Secondary | ICD-10-CM | POA: Diagnosis not present

## 2015-06-04 DIAGNOSIS — B3731 Acute candidiasis of vulva and vagina: Secondary | ICD-10-CM

## 2015-06-04 LAB — POCT WET + KOH PREP
Trich by wet prep: ABSENT
YEAST BY KOH: ABSENT
Yeast by wet prep: ABSENT

## 2015-06-04 MED ORDER — FLUCONAZOLE 150 MG PO TABS: 150.0000 mg | ORAL_TABLET | Freq: Once | ORAL | Status: DC

## 2015-06-04 NOTE — Patient Instructions (Signed)

## 2015-06-04 NOTE — Progress Notes (Signed)
Subjective:    Patient ID: [name] [name], female    DOB: 24-Feb-1976, 40 y.o.   MRN: [id number]  06/04/2015  Vaginitis   HPI This 40 y.o. female presents for evaluation of vaginal discharge.  Onset with vaginal itching yesterday and then discharge started today.  Clear; no odor.  No fever/chills/sweats.  +lower abdominal pressure.  +sexually active; same partner for six years.  No concern for STD.  No dysuria, frequency, hematuria, nocturia x baseline.  Recent antibiotic use two weeks ago.  Tried Monistat last night without improvement.  Regular menses; last menses normal.  S/p BTL.   Review of Systems  Constitutional: Negative for fever, chills, diaphoresis and fatigue.  Gastrointestinal: Negative for nausea, vomiting, abdominal pain and diarrhea.  Genitourinary: Positive for vaginal discharge. Negative for dysuria, urgency, frequency, hematuria, flank pain, decreased urine volume, vaginal bleeding, difficulty urinating, genital sores, vaginal pain, menstrual problem, pelvic pain and dyspareunia.    Past Medical History  Diagnosis Date  . Allergy   . Asthma   . Arthritis   . Obesity   . Sleep apnea 08-16-13     mild no cpap needed per sleep study results  . Headache(784.0)     migraine  . PONV (postoperative nausea and vomiting)    Past Surgical History  Procedure Laterality Date  . Knee arthroscopy Left 9-10 yrs ago  . Foot neuroma surgery Right 2013  . Tubal ligation  2004  . Laparoscopic gastric sleeve resection [name] 10/29/2013    Procedure: LAPAROSCOPIC GASTRIC SLEEVE RESECTION AND REPAIR OF HIATAL HERNIA;  Surgeon: [name], MD;  Location: [facility];  Service: General;  Laterality: [name];  . Upper gi endoscopy  10/29/2013    Procedure: UPPER GI ENDOSCOPY;  Surgeon: [name], MD;  Location: [facility];  Service: General;;  . Shoulder surgery     Allergies  Allergen Reactions  . Other Other (See Comments)    Nut allergy rash, tongue swells  . Amoxicillin  Rash  . Codeine Nausea And Vomiting and Rash  . Tramadol Nausea And Vomiting and Rash    Social History   Social History  . Marital Status: Single    Spouse Name: [name]  . Number of Children: [name]  . Years of Education: [name]   Occupational History  . Not on file.   Social History Main Topics  . Smoking status: Never Smoker   . Smokeless tobacco: Never Used  . Alcohol Use: Yes     Comment: occasionally  . Drug Use: No  . Sexual Activity: Not on file   Other Topics Concern  . Not on file   Social History Narrative   Family History  Problem Relation Age of Onset  . Lung disease Father   . Pulmonary embolism Father   . COPD Other   . Diabetes Other        Objective:    BP 122/82 mmHg  Pulse 80  Temp(Src) 98.9 F (37.2 C) (Oral)  Resp 17  Ht 5' (1.524 m)  Wt 189 lb (85.73 kg)  BMI 36.91 kg/m2  SpO2 98%  LMP 05/18/2015 Physical Exam  Constitutional: She is oriented to person, place, and time. She appears well-developed and well-nourished. No distress.  HENT:  Head: Normocephalic and atraumatic.  Eyes: Conjunctivae are normal. Pupils are equal, round, and reactive to light.  Neck: Normal range of motion. Neck supple.  Cardiovascular: Normal rate, regular rhythm and normal heart sounds.  Exam reveals no gallop and no  friction rub.   No murmur heard. Pulmonary/Chest: Effort normal and breath sounds normal. She has no wheezes. She has no rales.  Abdominal: Soft. Bowel sounds are normal. She exhibits no distension and no mass. There is no tenderness. There is no rebound and no guarding.  Genitourinary: Uterus normal. There is no rash, tenderness or lesion on the right labia. There is no rash, tenderness or lesion on the left labia. Cervix exhibits no motion tenderness, no discharge and no friability. Right adnexum displays tenderness. Left adnexum displays tenderness. There is erythema in the vagina. No tenderness or bleeding in the vagina. No foreign body around the  vagina. Vaginal discharge found.  Diffuse erythema R>L labia.  Neurological: She is alert and oriented to person, place, and time.  Skin: She is not diaphoretic.  Psychiatric: She has a normal mood and affect. Her behavior is normal.  Nursing note and vitals reviewed.  Results for orders placed or performed in visit on 06/04/15  POCT Wet + KOH Prep  Result Value Ref Range   Yeast by KOH Absent Present, Absent   Yeast by wet prep Absent Present, Absent   WBC by wet prep Moderate (A) None, Few, Too numerous to count   Clue Cells Wet Prep HPF POC None None, Too numerous to count   Trich by wet prep Absent Present, Absent   Bacteria Wet Prep HPF POC Few None, Few, Too numerous to count   Epithelial Cells By [employer] Pref ([facility]) Moderate (A) None, Few, Too numerous to count   RBC,UR,HPF,POC None None RBC/hpf       Assessment & Plan:   1. Candidal vulvovaginitis   2. Vaginal discharge    -New. -rx for Diflucan provided. -continue Monistat that was started last night which likely obscured wet prep results.   Orders Placed This Encounter  Procedures  . GC/Chlamydia Probe Amp  . POCT Wet + KOH Prep   Meds ordered this encounter  Medications  . fluconazole (DIFLUCAN) 150 MG tablet    Sig: Take 1 tablet (150 mg total) by mouth once. Repeat if needed    Dispense:  2 tablet    Refill:  0    No Follow-up on file.    Rhayne Chatwin Elayne Guerin, M.D. [facility] [address] [place], [place]  [address] [phone] phone [phone] fax

## 2015-06-05 LAB — GC/CHLAMYDIA PROBE AMP
CT Probe RNA: NOT DETECTED
GC Probe RNA: NOT DETECTED

## 2015-06-08 ENCOUNTER — Ambulatory Visit (INDEPENDENT_AMBULATORY_CARE_PROVIDER_SITE_OTHER): Payer: BLUE CROSS/BLUE SHIELD | Admitting: Family Medicine

## 2015-06-08 VITALS — BP 104/60 | HR 83 | Temp 98.6°F | Resp 17 | Ht 61.0 in | Wt 182.0 lb

## 2015-06-08 DIAGNOSIS — J45901 Unspecified asthma with (acute) exacerbation: Secondary | ICD-10-CM

## 2015-06-08 DIAGNOSIS — J069 Acute upper respiratory infection, unspecified: Secondary | ICD-10-CM | POA: Diagnosis not present

## 2015-06-08 MED ORDER — PREDNISONE 10 MG PO TABS: ORAL_TABLET | ORAL | Status: DC

## 2015-06-08 NOTE — Patient Instructions (Signed)
Upper Respiratory Infection, Adult Most upper respiratory infections (URIs) are a viral infection of the air passages leading to the lungs. A URI affects the nose, throat, and upper air passages. The most common type of URI is nasopharyngitis and is typically referred to as "the common cold." URIs run their course and usually go away on their own. Most of the time, a URI does not require medical attention, but sometimes a bacterial infection in the upper airways can follow a viral infection. This is called a secondary infection. Sinus and middle ear infections are common types of secondary upper respiratory infections. Bacterial pneumonia can also complicate a URI. A URI can worsen asthma and chronic obstructive pulmonary disease (COPD). Sometimes, these complications can require emergency medical care and may be life threatening.  CAUSES Almost all URIs are caused by viruses. A virus is a type of germ and can spread from one person to another.  RISKS FACTORS You may be at risk for a URI if:   You smoke.   You have chronic heart or lung disease.  You have a weakened defense (immune) system.   You are very young or very old.   You have nasal allergies or asthma.  You work in crowded or poorly ventilated areas.  You work in health care facilities or schools. SIGNS AND SYMPTOMS  Symptoms typically develop 2-3 days after you come in contact with a cold virus. Most viral URIs last 7-10 days. However, viral URIs from the influenza virus (flu virus) can last 14-18 days and are typically more severe. Symptoms may include:   Runny or stuffy (congested) nose.   Sneezing.   Cough.   Sore throat.   Headache.   Fatigue.   Fever.   Loss of appetite.   Pain in your forehead, behind your eyes, and over your cheekbones (sinus pain).  Muscle aches.  DIAGNOSIS  Your health care provider may diagnose a URI by:  Physical exam.  Tests to check that your symptoms are not due to  another condition such as:  Strep throat.  Sinusitis.  Pneumonia.  Asthma. TREATMENT  A URI goes away on its own with time. It cannot be cured with medicines, but medicines may be prescribed or recommended to relieve symptoms. Medicines may help:  Reduce your fever.  Reduce your cough.  Relieve nasal congestion. HOME CARE INSTRUCTIONS   Take medicines only as directed by your health care provider.   Gargle warm saltwater or take cough drops to comfort your throat as directed by your health care provider.  Use a warm mist humidifier or inhale steam from a shower to increase air moisture. This may make it easier to breathe.  Drink enough fluid to keep your urine clear or pale yellow.   Eat soups and other clear broths and maintain good nutrition.   Rest as needed.   Return to work when your temperature has returned to normal or as your health care provider advises. You may need to stay home longer to avoid infecting others. You can also use a face mask and careful hand washing to prevent spread of the virus.  Increase the usage of your inhaler if you have asthma.   Do not use any tobacco products, including cigarettes, chewing tobacco, or electronic cigarettes. If you need help quitting, ask your health care provider. PREVENTION  The best way to protect yourself from getting a cold is to practice good hygiene.   Avoid oral or hand contact with people with cold   symptoms.   °· Wash your hands often if contact occurs.   °There is no clear evidence that vitamin C, vitamin E, echinacea, or exercise reduces the chance of developing a cold. However, it is always recommended to get plenty of rest, exercise, and practice good nutrition.  °SEEK MEDICAL CARE IF:  °· You are getting worse rather than better.   °· Your symptoms are not controlled by medicine.   °· You have chills. °· You have worsening shortness of breath. °· You have brown or red mucus. °· You have yellow or brown nasal  discharge. °· You have pain in your face, especially when you bend forward. °· You have a fever. °· You have swollen neck glands. °· You have pain while swallowing. °· You have white areas in the back of your throat. °SEEK IMMEDIATE MEDICAL CARE IF:  °· You have severe or persistent: °¨ Headache. °¨ Ear pain. °¨ Sinus pain. °¨ Chest pain. °· You have chronic lung disease and any of the following: °¨ Wheezing. °¨ Prolonged cough. °¨ Coughing up blood. °¨ A change in your usual mucus. °· You have a stiff neck. °· You have changes in your: °¨ Vision. °¨ Hearing. °¨ Thinking. °¨ Mood. °MAKE SURE YOU:  °· Understand these instructions. °· Will watch your condition. °· Will get help right away if you are not doing well or get worse. °  °This information is not intended to replace advice given to you by your health care provider. Make sure you discuss any questions you have with your health care provider. °  °Document Released: 09/14/2000 Document Revised: 08/05/2014 Document Reviewed: 06/26/2013 °Elsevier Interactive Patient Education ©2016 [employer] ° °Asthma, Adult °Asthma is a recurring condition in which the airways tighten and narrow. Asthma can make it difficult to breathe. It can cause coughing, wheezing, and shortness of breath. Asthma episodes, also called asthma attacks, range from minor to life-threatening. Asthma cannot be cured, but medicines and lifestyle changes can help control it. °CAUSES °Asthma is believed to be caused by inherited (genetic) and environmental factors, but its exact cause is unknown. Asthma may be triggered by allergens, lung infections, or irritants in the air. Asthma triggers are different for each person. Common triggers include:  °· Animal dander. °· Dust mites. °· Cockroaches. °· Pollen from trees or grass. °· Mold. °· Smoke. °· Air pollutants such as dust, household cleaners, hair sprays, aerosol sprays, paint fumes, strong chemicals, or strong odors. °· Cold air, weather  changes, and winds (which increase molds and pollens in the air). °· Strong emotional expressions such as crying or laughing hard. °· Stress. °· Certain medicines (such as aspirin) or types of drugs (such as beta-blockers). °· Sulfites in foods and drinks. Foods and drinks that may contain sulfites include dried fruit, potato chips, and sparkling grape juice. °· Infections or inflammatory conditions such as the flu, a cold, or an inflammation of the nasal membranes (rhinitis). °· Gastroesophageal reflux disease (GERD). °· Exercise or strenuous activity. °SYMPTOMS °Symptoms may occur immediately after asthma is triggered or many hours later. Symptoms include: °· Wheezing. °· Excessive nighttime or early morning coughing. °· Frequent or severe coughing with a common cold. °· Chest tightness. °· Shortness of breath. °DIAGNOSIS  °The diagnosis of asthma is made by a review of your medical history and a physical exam. Tests may also be performed. These may include: °· Lung function studies. These tests show how much air you breathe in and out. °· Allergy tests. °· Imaging tests such   as X-rays. °TREATMENT  °Asthma cannot be cured, but it can usually be controlled. Treatment involves identifying and avoiding your asthma triggers. It also involves medicines. There are 2 classes of medicine used for asthma treatment:  °· Controller medicines. These prevent asthma symptoms from occurring. They are usually taken every day. °· Reliever or rescue medicines. These quickly relieve asthma symptoms. They are used as needed and provide short-term relief. °Your health care provider will help you create an asthma action plan. An asthma action plan is a written plan for managing and treating your asthma attacks. It includes a list of your asthma triggers and how they may be avoided. It also includes information on when medicines should be taken and when their dosage should be changed. An action plan may also involve the use of a device  called a peak flow meter. A peak flow meter measures how well the lungs are working. It helps you monitor your condition. °HOME CARE INSTRUCTIONS  °· Take medicines only as directed by your health care provider. Speak with your health care provider if you have questions about how or when to take the medicines. °· Use a peak flow meter as directed by your health care provider. Record and keep track of readings. °· Understand and use the action plan to help minimize or stop an asthma attack without needing to seek medical care. °· Control your home environment in the following ways to help prevent asthma attacks: °¨ Do not smoke. Avoid being exposed to secondhand smoke. °¨ Change your heating and air conditioning filter regularly. °¨ Limit your use of fireplaces and wood stoves. °¨ Get rid of pests (such as roaches and mice) and their droppings. °¨ Throw away plants if you see mold on them. °¨ Clean your floors and dust regularly. Use unscented cleaning products. °¨ Try to have someone else vacuum for you regularly. Stay out of rooms while they are being vacuumed and for a short while afterward. If you vacuum, use a dust mask from a hardware store, a double-layered or microfilter vacuum cleaner bag, or a vacuum cleaner with a HEPA filter. °¨ Replace carpet with wood, tile, or vinyl flooring. Carpet can trap dander and dust. °¨ Use allergy-proof pillows, mattress covers, and box spring covers. °¨ Wash bed sheets and blankets every week in hot water and dry them in a dryer. °¨ Use blankets that are made of polyester or cotton. °¨ Clean bathrooms and kitchens with bleach. If possible, have someone repaint the walls in these rooms with mold-resistant paint. Keep out of the rooms that are being cleaned and painted. °¨ Wash hands frequently. °SEEK MEDICAL CARE IF:  °· You have wheezing, shortness of breath, or a cough even if taking medicine to prevent attacks. °· The colored mucus you cough up (sputum) is thicker than  usual. °· Your sputum changes from clear or white to yellow, green, gray, or bloody. °· You have any problems that may be related to the medicines you are taking (such as a rash, itching, swelling, or trouble breathing). °· You are using a reliever medicine more than 2-3 times per week. °· Your peak flow is still at 50-79% of your personal best after following your action plan for 1 hour. °· You have a fever. °SEEK IMMEDIATE MEDICAL CARE IF:  °· You seem to be getting worse and are unresponsive to treatment during an asthma attack. °· You are short of breath even at rest. °· You get short of breath when doing   very little physical activity. °· You have difficulty eating, drinking, or talking due to asthma symptoms. °· You develop chest pain. °· You develop a fast heartbeat. °· You have a bluish color to your lips or fingernails. °· You are light-headed, dizzy, or faint. °· Your peak flow is less than 50% of your personal best. °  °This information is not intended to replace advice given to you by your health care provider. Make sure you discuss any questions you have with your health care provider. °  °Document Released: 03/21/2005 Document Revised: 12/10/2014 Document Reviewed: 10/18/2012 °Elsevier Interactive Patient Education ©2016 [employer]vier Inc. ° °

## 2015-06-08 NOTE — Progress Notes (Signed)
[place] at [facility] [address], [place], [place] [address] [id number] [phone]  Date:  06/08/2015   Name:  [name] [name]   DOB:  18-Sep-1975   MRN:  [id number]  PCP:  Leotis Pain, DO    Chief Complaint: Fever; Cough; Nasal Congestion; and Generalized Body Aches   History of Present Illness:  [name] [name] is a 40 y.o. very pleasant female patient who presents with the following: URI/Cough:  Cough:  - Began 2 days ago.  - Yesterday was worse than yesterday.  Temp up to 99.8 yesterday, not higher.  Treated for clindamycin 3 weeks ago for sinus infection Congestion:  - Has been using Flonase for several weeks.  - Congestion began 3 days ago.  Mild muscle aches resolved with tylenol.    Asthma:  - Taking Asmanex daily  - Used inhaler 2x yesterday - No wheezing or tightness overnight  - No SoB - Coughing periodically through night, never constant. Did not feel like she needed a treatment overnight.   No known sick contacts, but works on a city[employer] and uses hand sanitizer.     Patient Active Problem List   Diagnosis Date Noted  . 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 Gallstones 10/15/2013  . Morbid obesity ([place]) 09/21/2012  . Asthma 05/14/2011    Past Medical History  Diagnosis Date  . Allergy   . Asthma   . Arthritis   . Obesity   . Sleep apnea 08-16-13     mild no cpap needed per sleep study results  . Headache(784.0)     migraine  . PONV (postoperative nausea and vomiting)     Past Surgical History  Procedure Laterality Date  . Knee arthroscopy Left 9-10 yrs ago  . Foot neuroma surgery Right 2013  . Tubal ligation  2004  . Laparoscopic gastric sleeve resection [name] 10/29/2013    Procedure: LAPAROSCOPIC GASTRIC SLEEVE RESECTION AND REPAIR OF HIATAL HERNIA;  Surgeon: [name], MD;  Location: [facility];  Service: General;  Laterality: [name];  . Upper gi endoscopy   10/29/2013    Procedure: UPPER GI ENDOSCOPY;  Surgeon: [name], MD;  Location: [facility];  Service: General;;  . Shoulder surgery      Social History  Substance Use Topics  . Smoking status: Never Smoker   . Smokeless tobacco: Never Used  . Alcohol Use: Yes     Comment: occasionally    Family History  Problem Relation Age of Onset  . Lung disease Father   . Pulmonary embolism Father   . COPD Other   . Diabetes Other     Allergies  Allergen Reactions  . Other Other (See Comments)    Nut allergy rash, tongue swells  . Amoxicillin Rash  . Codeine Nausea And Vomiting and Rash  . Tramadol Nausea And Vomiting and Rash    Medication list has been reviewed and updated.  Current Outpatient Prescriptions on File Prior to Visit  Medication Sig Dispense Refill  . albuterol (PROVENTIL HFA;VENTOLIN HFA) 108 (90 BASE) MCG/ACT inhaler Inhale 1 puff into the lungs every 6 (six) hours as needed for wheezing or shortness of breath. 1 Inhaler 5  . Antiseborrheic Products, Misc. (PROMISEB) CREA Apply 1 application topically every morning.    . clindamycin (CLEOCIN) 300 MG capsule Take 1 capsule (300 mg total) by mouth 3 (three) times daily. 30 capsule 0  . clobetasol (TEMOVATE)  0.05 % external solution Apply 1 application topically daily as needed (Itching). To scalp    . fluconazole (DIFLUCAN) 150 MG tablet Take 1 tablet (150 mg total) by mouth once. Repeat if needed 2 tablet 0  . mometasone (ASMANEX) 220 MCG/INH inhaler Inhale 1 puff into the lungs daily. Rinse mouth with water after using. 1 Inhaler 11   Current Facility-Administered Medications on File Prior to Visit  Medication Dose Route Frequency Provider Last Rate Last Dose  . albuterol (PROVENTIL) (2.5 MG/3ML) 0.083% nebulizer solution 2.5 mg  2.5 mg Nebulization Once Thao P Le, DO        Review of Systems:  Review of Systems  Constitutional: Negative for fever and chills.  HENT: Positive for congestion. Negative for ear  pain, nosebleeds and sore throat.   Respiratory: Positive for cough and sputum production. Negative for hemoptysis, shortness of breath, wheezing and stridor.   Cardiovascular: Negative for chest pain.  Gastrointestinal: Negative for nausea, vomiting, constipation and melena.  Musculoskeletal: Positive for myalgias (mild).  Neurological: Negative for dizziness and headaches.    Physical Examination: Filed Vitals:   06/08/15 1059  BP: 104/60  Pulse: 83  Temp: 98.6 F (37 C)  Resp: 17   Filed Vitals:   06/08/15 1059  Height: 5\' 1"  (1.549 m)  Weight: 182 lb (82.555 kg)   Body mass index is 34.41 kg/(m^2). Ideal Body Weight: Weight in (lb) to have BMI = 25: 132  GEN: WDWN, NAD, Non-toxic, A & O x 3 HEENT: Atraumatic, Normocephalic. Neck supple. No masses, No LAD. Ears and Nose: No external deformity. CV: RRR, No M/G/R. No JVD. No thrill. No extra heart sounds. PULM: relatively CTA B, mild wheezing with forced expiration.   No crackles, rhonchi.  No retractions. No resp. distress. No accessory muscle use. ABD: S, NT, ND, +BS. No rebound. No HSM. EXTR: No c/c/e NEURO Normal gait.  PSYCH: Normally interactive. Conversant. Not depressed or anxious appearing.  Calm demeanor.   Assessment and Plan: URI with mild exacerbation of Asthma: discussed use of inhaler PRN. Not needing overnight, only 2x yesterday.  Vitals stable.  Mild wheeze with forced expiration.  No hx of hospitalization.  - Continue flonase. Consider Netti Pot.   - Albuterol PRN. Seek medical attention if requiring albuterol > q4h.   - Prednisone taper.  - f/u PRN. Return if not continuing to improve.   Signed Gerre Pebbles, MD

## 2015-07-09 ENCOUNTER — Ambulatory Visit (INDEPENDENT_AMBULATORY_CARE_PROVIDER_SITE_OTHER): Payer: BLUE CROSS/BLUE SHIELD | Admitting: Family Medicine

## 2015-07-09 VITALS — BP 110/70 | HR 81 | Temp 97.4°F | Resp 18 | Ht 60.0 in | Wt 195.0 lb

## 2015-07-09 DIAGNOSIS — J4521 Mild intermittent asthma with (acute) exacerbation: Secondary | ICD-10-CM | POA: Diagnosis not present

## 2015-07-09 DIAGNOSIS — J309 Allergic rhinitis, unspecified: Secondary | ICD-10-CM

## 2015-07-09 DIAGNOSIS — R05 Cough: Secondary | ICD-10-CM

## 2015-07-09 DIAGNOSIS — R059 Cough, unspecified: Secondary | ICD-10-CM

## 2015-07-09 MED ORDER — BENZONATATE 100 MG PO CAPS: 100.0000 mg | ORAL_CAPSULE | Freq: Three times a day (TID) | ORAL | Status: DC | PRN

## 2015-07-09 MED ORDER — PREDNISONE 20 MG PO TABS: 40.0000 mg | ORAL_TABLET | Freq: Every day | ORAL | Status: DC

## 2015-07-09 NOTE — Progress Notes (Signed)
Subjective:  By signing my name below, I, [name] [name], attest that this documentation has been prepared under the direction and in the presence of [name] Ray, MD. Electronically Signed: Moises [name], [place]. 07/09/2015 , 3:11 PM .  Patient was seen in Room 13 .   Patient ID: [name] [name], female    DOB: January 30, 1976, 40 y.o.   MRN: [id number] Chief Complaint  Patient presents with  . Asthma    started x4 days ago   HPI [name] [name] is a 40 y.o. female Here for asthma flare. She was seen 1 month ago, seen for a cough at that time; thought to have URI with mild flare of asthma. Treated with prednisone taper, albuterol as needed. Was continued on asmanex daily.   She states that her asthma flared up 2 days ago. She informs going on a cruise to the [place]. When she was returning to the [facility] 5 days ago, she started coughing due to seasonal pollen allergies. She's been having more cough and wheezing started up yesterday. She also mentions noticing itchy eyes starting today. She reports using flonase nasal spray, and taking claritin daily. She's also used her nebulizer breathing treatment at home for relief. She informs using her albuterol inhaler 6-7 times a day for cough and wheezing. She's still on asmanex, increasing her dose to twice a day starting yesterday due to flare up. Patient does note allergy season usually causing her asthma to flare up.   Last use of her albuterol was about 12:00PM today.   Patient Active Problem List   Diagnosis Date Noted  . 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 Gallstones 10/15/2013  . Morbid obesity ([place]) 09/21/2012  . Asthma 05/14/2011   Past Medical History  Diagnosis Date  . Allergy   . Asthma   . Arthritis   . Obesity   . Sleep apnea 08-16-13     mild no cpap needed per sleep study results  . Headache(784.0)     migraine  . PONV (postoperative nausea and vomiting)    Past Surgical History    Procedure Laterality Date  . Knee arthroscopy Left 9-10 yrs ago  . Foot neuroma surgery Right 2013  . Tubal ligation  2004  . Laparoscopic gastric sleeve resection [name] 10/29/2013    Procedure: LAPAROSCOPIC GASTRIC SLEEVE RESECTION AND REPAIR OF HIATAL HERNIA;  Surgeon: [name], MD;  Location: [facility];  Service: General;  Laterality: [name];  . Upper gi endoscopy  10/29/2013    Procedure: UPPER GI ENDOSCOPY;  Surgeon: [name], MD;  Location: [facility];  Service: General;;  . Shoulder surgery     Allergies  Allergen Reactions  . Other Other (See Comments)    Nut allergy rash, tongue swells  . Amoxicillin Rash  . Codeine Nausea And Vomiting and Rash  . Tramadol Nausea And Vomiting and Rash   Prior to Admission medications   Medication Sig Start Date End Date Taking? Authorizing Provider  albuterol (PROVENTIL HFA;VENTOLIN HFA) 108 (90 BASE) MCG/ACT inhaler Inhale 1 puff into the lungs every 6 (six) hours as needed for wheezing or shortness of breath. 03/06/15  Yes Darlyne Russian, MD  Antiseborrheic Products, [employer]. (PROMISEB) CREA Apply 1 application topically every morning.   Yes Historical Provider, MD  clobetasol (TEMOVATE) 0.05 % external solution Apply 1 application topically daily as needed (Itching). To scalp 03/20/13  Yes Historical Provider, MD  mometasone (ASMANEX) 220 MCG/INH inhaler Inhale 1 puff into  the lungs daily. Rinse mouth with water after using. 03/06/15  Yes Darlyne Russian, MD   Social History   Social History  . Marital Status: Single    Spouse Name: [name]  . Number of Children: [name]  . Years of Education: [name]   Occupational History  . Not on file.   Social History Main Topics  . Smoking status: Never Smoker   . Smokeless tobacco: Never Used  . Alcohol Use: Yes     Comment: occasionally  . Drug Use: No  . Sexual Activity: Not on file   Other Topics Concern  . Not on file   Social History Narrative   Review of Systems  Constitutional: Negative for  fever, chills and fatigue.  HENT: Negative for congestion, rhinorrhea and sore throat.   Eyes: Positive for itching.  Respiratory: Positive for cough and wheezing. Negative for shortness of breath.   Cardiovascular: Negative for chest pain.  Gastrointestinal: Negative for nausea and vomiting.  Allergic/Immunologic: Positive for environmental allergies.       Objective:   Physical Exam  Constitutional: She is oriented to person, place, and time. She appears well-developed and well-nourished. No distress.  HENT:  Head: Normocephalic and atraumatic.  Eyes: EOM are normal. Pupils are equal, round, and reactive to light.  Neck: Neck supple.  Cardiovascular: Normal rate.   Pulmonary/Chest: Effort normal and breath sounds normal. No respiratory distress. She has no wheezes.  Musculoskeletal: Normal range of motion.  Neurological: She is alert and oriented to person, place, and time.  Skin: Skin is warm and dry.  Psychiatric: She has a normal mood and affect. Her behavior is normal.  Nursing note and vitals reviewed.   Filed Vitals:   07/09/15 1427  BP: 110/70  Pulse: 81  Temp: 97.4 F (36.3 C)  TempSrc: Oral  Resp: 18  Height: 5' (1.524 m)  Weight: 195 lb (88.451 kg)  SpO2: 99%   Predicted peak flow: 400 Peak flow is 100% as predicted: 400     Assessment & Plan:   RIONA GELFAND is a 40 y.o. female Asthma, mild intermittent, with acute exacerbation - Plan: predniSONE (DELTASONE) 20 MG tablet  Cough - Plan: predniSONE (DELTASONE) 20 MG tablet, benzonatate (TESSALON) 100 MG capsule  Allergic rhinitis, unspecified allergic rhinitis type  Suspected allergic rhinitis flare with postnasal drip/cough and some flare of asthma symptoms. Lungs are clear on exam in office, peak flow normal. Last albuterol inhalation a few hours ago. She has increased her Asmanex dosing, so this may be sufficient for control of current flare.  -Continue Asmanex twice a day through allergy  season.  -Okay to continue albuterol up to every 4-6 hours today into tomorrow, but if still frequent need, start prednisone 40 mg daily for 3 days. Side effects discussed.  -Trial of Tessalon Perles for irritant cough, continue Flonase nasal spray and antihistamine, air purifier at home if this is beneficial.  -RTC precautions  Meds ordered this encounter  Medications  . predniSONE (DELTASONE) 20 MG tablet    Sig: Take 2 tablets (40 mg total) by mouth daily with breakfast.    Dispense:  6 tablet    Refill:  0  . benzonatate (TESSALON) 100 MG capsule    Sig: Take 1 capsule (100 mg total) by mouth 3 (three) times daily as needed for cough.    Dispense:  20 capsule    Refill:  0   Patient Instructions       IF you received  an x-[name] today, you will receive an invoice from [facility]. Please contact [facility] at [phone] with questions or concerns regarding your invoice.   IF you received labwork today, you will receive an invoice from [employer]. Please contact [facility] at [phone] with questions or concerns regarding your invoice.   Our billing staff will not be able to assist you with questions regarding bills from these companies.  You will be contacted with the lab results as soon as they are available. The fastest way to get your results is to activate your My Chart account. Instructions are located on the last page of this paperwork. If you have not heard from [place] regarding the results in 2 weeks, please contact this office.     Your lungs sounds clear here in the office and  peak flow was also normal. Continue Asmanex twice per day, albuterol if needed, then if frequent albuterol still needed into tomorrow morning, can start prednisone for 3 days. I would continue the Asmanex twice per day during allergy season. You can also try Tessalon Perles as needed for the irritation/cough, and continue the Flonase and antihistamine for  your allergies. Also can use an air purifier in the room where you sleep to lessen allergen exposure.  Return to the clinic or go to the nearest emergency room if any of your symptoms worsen or new symptoms occur.  Cough, Adult Coughing is a reflex that clears your throat and your airways. Coughing helps to heal and protect your lungs. It is normal to cough occasionally, but a cough that happens with other symptoms or lasts a long time may be a sign of a condition that needs treatment. A cough may last only 2-3 weeks (acute), or it may last longer than 8 weeks (chronic). CAUSES Coughing is commonly caused by:  Breathing in substances that irritate your lungs.  A viral or bacterial respiratory infection.  Allergies.  Asthma.  Postnasal drip.  Smoking.  Acid backing up from the stomach into the esophagus (gastroesophageal reflux).  Certain medicines.  Chronic lung problems, including COPD (or rarely, lung cancer).  Other medical conditions such as heart failure. HOME CARE INSTRUCTIONS  Pay attention to any changes in your symptoms. Take these actions to help with your discomfort:  Take medicines only as told by your health care provider.  If you were prescribed an antibiotic medicine, take it as told by your health care provider. Do not stop taking the antibiotic even if you start to feel better.  Talk with your health care provider before you take a cough suppressant medicine.  Drink enough fluid to keep your urine clear or pale yellow.  If the air is dry, use a cold steam vaporizer or humidifier in your bedroom or your home to help loosen secretions.  Avoid anything that causes you to cough at work or at home.  If your cough is worse at night, try sleeping in a semi-upright position.  Avoid cigarette smoke. If you smoke, quit smoking. If you need help quitting, ask your health care provider.  Avoid caffeine.  Avoid alcohol.  Rest as needed. SEEK MEDICAL CARE IF:     You have new symptoms.  You cough up pus.  Your cough does not get better after 2-3 weeks, or your cough gets worse.  You cannot control your cough with suppressant medicines and you are losing sleep.  You develop pain that is getting worse or pain that is not controlled with pain medicines.  You have a fever.  You have unexplained weight loss.  You have night sweats. SEEK IMMEDIATE MEDICAL CARE IF:  You cough up [name].  You have difficulty breathing.  Your heartbeat is very fast.   This information is not intended to replace advice given to you by your health care provider. Make sure you discuss any questions you have with your health care provider.   Document Released: 09/17/2010 Document Revised: 12/10/2014 Document Reviewed: 05/28/2014 Elsevier Interactive Patient Education 2016 [place].   Asthma, Acute Bronchospasm Acute bronchospasm caused by asthma is also referred to as an asthma attack. Bronchospasm means your air passages become narrowed. The narrowing is caused by inflammation and tightening of the muscles in the air tubes (bronchi) in your lungs. This can make it hard to breathe or cause you to wheeze and cough. CAUSES Possible triggers are:  Animal dander from the skin, hair, or feathers of animals.  Dust mites contained in house dust.  Cockroaches.  Pollen from trees or grass.  Mold.  Cigarette or tobacco smoke.  Air pollutants such as dust, household cleaners, hair sprays, aerosol sprays, paint fumes, strong chemicals, or strong odors.  Cold air or weather changes. Cold air may trigger inflammation. Winds increase molds and pollens in the air.  Strong emotions such as crying or laughing hard.  Stress.  Certain medicines such as aspirin or beta-blockers.  Sulfites in foods and drinks, such as dried fruits and wine.  Infections or inflammatory conditions, such as a flu, cold, or inflammation of the nasal membranes  (rhinitis).  Gastroesophageal reflux disease (GERD). GERD is a condition where stomach acid backs up into your esophagus.  Exercise or strenuous activity. SIGNS AND SYMPTOMS   Wheezing.  Excessive coughing, particularly at night.  Chest tightness.  Shortness of breath. DIAGNOSIS  Your health care provider will ask you about your medical history and perform a physical exam. A chest X-[name] or [name] testing may be performed to look for other causes of your symptoms or other conditions that may have triggered your asthma attack. TREATMENT  Treatment is aimed at reducing inflammation and opening up the airways in your lungs. Most asthma attacks are treated with inhaled medicines. These include quick relief or rescue medicines (such as bronchodilators) and controller medicines (such as inhaled corticosteroids). These medicines are sometimes given through an inhaler or a nebulizer. Systemic steroid medicine taken by mouth or given through an IV tube also can be used to reduce the inflammation when an attack is moderate or severe. Antibiotic medicines are only used if a bacterial infection is present.  HOME CARE INSTRUCTIONS   Rest.  Drink plenty of liquids. This helps the mucus to remain thin and be easily coughed up. Only use caffeine in moderation and do not use alcohol until you have recovered from your illness.  Do not smoke. Avoid being exposed to secondhand smoke.  You play a critical role in keeping yourself in good health. Avoid exposure to things that cause you to wheeze or to have breathing problems.  Keep your medicines up-to-date and available. Carefully follow your health care provider's treatment plan.  Take your medicine exactly as prescribed.  When pollen or pollution is bad, keep windows closed and use an air conditioner or go to places with air conditioning.  Asthma requires careful medical care. See your health care provider for a follow-up as advised. If you are more  than [redacted] weeks pregnant and you were prescribed any new medicines, let your obstetrician know  about the visit and how you are doing. Follow up with your health care provider as directed.  After you have recovered from your asthma attack, make an appointment with your outpatient doctor to talk about ways to reduce the likelihood of future attacks. If you do not have a doctor who manages your asthma, make an appointment with a primary care doctor to discuss your asthma. SEEK IMMEDIATE MEDICAL CARE IF:   You are getting worse.  You have trouble breathing. If severe, call your local emergency services (911 in the [place]  You develop chest pain or discomfort.  You are vomiting.  You are not able to keep fluids down.  You are coughing up yellow, green, brown, or bloody sputum.  You have a fever and your symptoms suddenly get worse.  You have trouble swallowing. MAKE SURE YOU:   Understand these instructions.  Will watch your condition.  Will get help right away if you are not doing well or get worse.   This information is not intended to replace advice given to you by your health care provider. Make sure you discuss any questions you have with your health care provider.   Document Released: 07/06/2006 Document Revised: 03/26/2013 Document Reviewed: 09/26/2012 Elsevier Interactive Patient Education [employer].     I personally performed the services described in this documentation, which was scribed in my presence. The recorded information has been reviewed and considered, and addended by me as needed.

## 2015-07-09 NOTE — Patient Instructions (Addendum)
IF you received an x-ray today, you will receive an invoice from [facility]. Please contact [facility] at [phone] with questions or concerns regarding your invoice.   IF you received labwork today, you will receive an invoice from [employer]. Please contact [facility] at [phone] with questions or concerns regarding your invoice.   Our billing staff will not be able to assist you with questions regarding bills from these companies.  You will be contacted with the lab results as soon as they are available. The fastest way to get your results is to activate your My Chart account. Instructions are located on the last page of this paperwork. If you have not heard from [place] regarding the results in 2 weeks, please contact this office.     Your lungs sounds clear here in the office and  peak flow was also normal. Continue Asmanex twice per day, albuterol if needed, then if frequent albuterol still needed into tomorrow morning, can start prednisone for 3 days. I would continue the Asmanex twice per day during allergy season. You can also try Tessalon Perles as needed for the irritation/cough, and continue the Flonase and antihistamine for your allergies. Also can use an air purifier in the room where you sleep to lessen allergen exposure.  Return to the clinic or go to the nearest emergency room if any of your symptoms worsen or new symptoms occur.  Cough, Adult Coughing is a reflex that clears your throat and your airways. Coughing helps to heal and protect your lungs. It is normal to cough occasionally, but a cough that happens with other symptoms or lasts a long time may be a sign of a condition that needs treatment. A cough may last only 2-3 weeks (acute), or it may last longer than 8 weeks (chronic). CAUSES Coughing is commonly caused by:  Breathing in substances that irritate your lungs.  A viral or bacterial respiratory  infection.  Allergies.  Asthma.  Postnasal drip.  Smoking.  Acid backing up from the stomach into the esophagus (gastroesophageal reflux).  Certain medicines.  Chronic lung problems, including COPD (or rarely, lung cancer).  Other medical conditions such as heart failure. HOME CARE INSTRUCTIONS  Pay attention to any changes in your symptoms. Take these actions to help with your discomfort:  Take medicines only as told by your health care provider.  If you were prescribed an antibiotic medicine, take it as told by your health care provider. Do not stop taking the antibiotic even if you start to feel better.  Talk with your health care provider before you take a cough suppressant medicine.  Drink enough fluid to keep your urine clear or pale yellow.  If the air is dry, use a cold steam vaporizer or humidifier in your bedroom or your home to help loosen secretions.  Avoid anything that causes you to cough at work or at home.  If your cough is worse at night, try sleeping in a semi-upright position.  Avoid cigarette smoke. If you smoke, quit smoking. If you need help quitting, ask your health care provider.  Avoid caffeine.  Avoid alcohol.  Rest as needed. SEEK MEDICAL CARE IF:   You have new symptoms.  You cough up pus.  Your cough does not get better after 2-3 weeks, or your cough gets worse.  You cannot control your cough with suppressant medicines and you are losing sleep.  You develop pain that is getting worse or pain that is not  controlled with pain medicines.  You have a fever.  You have unexplained weight loss.  You have night sweats. SEEK IMMEDIATE MEDICAL CARE IF:  You cough up blood.  You have difficulty breathing.  Your heartbeat is very fast.   This information is not intended to replace advice given to you by your health care provider. Make sure you discuss any questions you have with your health care provider.   Document Released:  09/17/2010 Document Revised: 12/10/2014 Document Reviewed: 05/28/2014 Elsevier Interactive Patient Education 2016 [employer].   Asthma, Acute Bronchospasm Acute bronchospasm caused by asthma is also referred to as an asthma attack. Bronchospasm means your air passages become narrowed. The narrowing is caused by inflammation and tightening of the muscles in the air tubes (bronchi) in your lungs. This can make it hard to breathe or cause you to wheeze and cough. CAUSES Possible triggers are:  Animal dander from the skin, hair, or feathers of animals.  Dust mites contained in house dust.  Cockroaches.  Pollen from trees or grass.  Mold.  Cigarette or tobacco smoke.  Air pollutants such as dust, household cleaners, hair sprays, aerosol sprays, paint fumes, strong chemicals, or strong odors.  Cold air or weather changes. Cold air may trigger inflammation. Winds increase molds and pollens in the air.  Strong emotions such as crying or laughing hard.  Stress.  Certain medicines such as aspirin or beta-blockers.  Sulfites in foods and drinks, such as dried fruits and wine.  Infections or inflammatory conditions, such as a flu, cold, or inflammation of the nasal membranes (rhinitis).  Gastroesophageal reflux disease (GERD). GERD is a condition where stomach acid backs up into your esophagus.  Exercise or strenuous activity. SIGNS AND SYMPTOMS   Wheezing.  Excessive coughing, particularly at night.  Chest tightness.  Shortness of breath. DIAGNOSIS  Your health care provider will ask you about your medical history and perform a physical exam. A chest X-ray or blood testing may be performed to look for other causes of your symptoms or other conditions that may have triggered your asthma attack. TREATMENT  Treatment is aimed at reducing inflammation and opening up the airways in your lungs. Most asthma attacks are treated with inhaled medicines. These include quick relief or  rescue medicines (such as bronchodilators) and controller medicines (such as inhaled corticosteroids). These medicines are sometimes given through an inhaler or a nebulizer. Systemic steroid medicine taken by mouth or given through an IV tube also can be used to reduce the inflammation when an attack is moderate or severe. Antibiotic medicines are only used if a bacterial infection is present.  HOME CARE INSTRUCTIONS   Rest.  Drink plenty of liquids. This helps the mucus to remain thin and be easily coughed up. Only use caffeine in moderation and do not use alcohol until you have recovered from your illness.  Do not smoke. Avoid being exposed to secondhand smoke.  You play a critical role in keeping yourself in good health. Avoid exposure to things that cause you to wheeze or to have breathing problems.  Keep your medicines up-to-date and available. Carefully follow your health care provider's treatment plan.  Take your medicine exactly as prescribed.  When pollen or pollution is bad, keep windows closed and use an air conditioner or go to places with air conditioning.  Asthma requires careful medical care. See your health care provider for a follow-up as advised. If you are more than [redacted] weeks pregnant and you were prescribed any new  medicines, let your obstetrician know about the visit and how you are doing. Follow up with your health care provider as directed.  After you have recovered from your asthma attack, make an appointment with your outpatient doctor to talk about ways to reduce the likelihood of future attacks. If you do not have a doctor who manages your asthma, make an appointment with a primary care doctor to discuss your asthma. SEEK IMMEDIATE MEDICAL CARE IF:   You are getting worse.  You have trouble breathing. If severe, call your local emergency services (911 in the [place]  You develop chest pain or discomfort.  You are vomiting.  You are not able to keep fluids  down.  You are coughing up yellow, green, brown, or bloody sputum.  You have a fever and your symptoms suddenly get worse.  You have trouble swallowing. MAKE SURE YOU:   Understand these instructions.  Will watch your condition.  Will get help right away if you are not doing well or get worse.   This information is not intended to replace advice given to you by your health care provider. Make sure you discuss any questions you have with your health care provider.   Document Released: 07/06/2006 Document Revised: 03/26/2013 Document Reviewed: 09/26/2012 Elsevier Interactive Patient Education [employer].

## 2015-07-27 ENCOUNTER — Encounter: Payer: Self-pay | Admitting: Dietician

## 2015-07-27 ENCOUNTER — Encounter: Payer: BLUE CROSS/BLUE SHIELD | Attending: General Surgery | Admitting: Dietician

## 2015-07-27 DIAGNOSIS — Z9884 Bariatric surgery status: Secondary | ICD-10-CM | POA: Insufficient documentation

## 2015-07-27 NOTE — Patient Instructions (Addendum)
Goals:  Follow Phase 3B: High Protein + Non-Starchy Vegetables  Eat 3-6 small meals/snacks, every 3-5 hrs  Increase lean protein foods to meet 60g goal  Increase fluid intake to 64oz +  Avoid drinking 15 minutes before, during and 30 minutes after eating  Aim for to get in 150 minutes of physical activity per week when schedule allows  Try using regular creamer with stevia/splena/equal  Try PB2 or Jif powdered peanut butter  Weigh yourself 1 x week, same day and time to monitor weight loss  Eat protein first, then veggies, and have fruit if you are still hungry  Make sure to have snacks only if you are hungry  Consider limiting sweets on "cheat day" if you aren't satisfied with weight loss  Surgery date: 10/29/2013 Surgery type: Gastric sleeve Start weight at [facility]: 277.5 lbs on 05/08/2013 Weight today: 190 lbs  Weight change: 11.5 lbs weight gain Total weight loss: 87.5 lbs Weight loss goal: 200  Achieved! New goal 178 lbs  TANITA  BODY COMP RESULTS  10/14/13 11/12/13 12/24/13 01/30/14 04/28/14 07/28/14 10/28/14 01/26/15 07/27/15   BMI (kg/m^2) 55.9 50.2 46.4 43.6 39.1 37.3 35.5 34.9 37.1   Fat Mass (lbs) 150 136.5 114.0 103.5 80.0 86.0 68.5 55.5 84.0   Fat Free Mass (lbs) 136 120.5 123.5 120.0 120.0 105.0 113.5 123.0 106   Total Body Water (lbs) 99.5 88.0 90.5 88.0 88.0 77.0 83.0 90.0 77.5

## 2015-07-27 NOTE — Progress Notes (Signed)
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 Returns with a 11.5  lbs weight gain the past 6 months. Has had a lot of asthma flares and has been taking Asmanex (steroid) which has been causing her to crave sweets.  In the last week and a half started exercising more and getting back to high protein and low carb. Is having Sunday as a "cheat day". Has been not having sweets except Sunday in past week and a half.   Can eat eggs again.   Surgery date: 10/29/2013 Surgery type: Gastric sleeve Start weight at [facility]: 277.5 lbs on 05/08/2013 Weight today: 190 lbs  Weight change: 11.5 lbs weight gain Total weight loss: 87.5 lbs Weight loss goal: 200  Achieved! New goal 178 lbs  TANITA  BODY COMP RESULTS  10/14/13 11/12/13 12/24/13 01/30/14 04/28/14 07/28/14 10/28/14 01/26/15 07/27/15   BMI (kg/m^2) 55.9 50.2 46.4 43.6 39.1 37.3 35.5 34.9 37.1   Fat Mass (lbs) 150 136.5 114.0 103.5 80.0 86.0 68.5 55.5 84.0   Fat Free Mass (lbs) 136 120.5 123.5 120.0 120.0 105.0 113.5 123.0 106   Total Body Water (lbs) 99.5 88.0 90.5 88.0 88.0 77.0 83.0 90.0 77.5    Preferred Learning Style:   No preference indicated   Learning Readiness:   Ready  24-hr recall: B (7 AM): Pure protein with skim milk (23 g) Snk (AM): none or chicken broth L (PM): salad with 2-3 oz ham/turkey (14-21 g) Snk (230 PM): apples/banana sometimes with salted carmel peanuts or peanut butter  (4 g) Snk (7 AM): Pure protein with skim milk (23 g) D (PM): meat 3 oz with green beans and squash (21 g) Snk (PM): decaf coffee with sweetened creamer  Having 2 protein shakes each day  Fluid intake: 20 oz water with flavor, 16 oz protein shake, 36 oz decaf coffee with Sweetened creamer  Estimated total protein intake: ~85 g  Medications: see list  Supplementation:  taking  Using straws: No Drinking while eating: No Hair loss: No Carbonated beverages: No N/V/D/C: No Dumping syndrome: No  Recent physical activity:  Walks 3 x week for 25 minutes and goes to the gym 2-3 x week cardio and weights  Progress Towards Goal(s):  In progress.   Nutritional Diagnosis:  [place]-3.3 Overweight/obesity related to past poor dietary habits and physical inactivity as evidenced by patient w/ recent sleeve gastrectomy surgery following dietary guidelines for continued weight loss.    Intervention:  Nutrition education/diet reinforcement  Goals:  Follow Phase 3B: High Protein + Non-Starchy Vegetables  Eat 3-6 small meals/snacks, every 3-5 hrs  Increase lean protein foods to meet 60g goal  Increase fluid intake to 64oz +  Avoid drinking 15 minutes before, during and 30 minutes after eating  Aim for to get in 150 minutes of physical activity per week when schedule allows  Try using regular creamer with stevia/splena/equal  Try PB2 or Jif powdered peanut butter  Weigh yourself 1 x week, same day and time to monitor weight loss  Eat protein first, then veggies, and have fruit if you are still hungry  Make sure to have snacks only if you are hungry  Consider limiting sweets on "cheat day" if you aren't satisfied with weight loss   Teaching Method Utilized:  Visual Auditory Hands on  Barriers to learning/adherence to lifestyle change: none  Demonstrated degree of understanding  via:  Teach Back   Monitoring/Evaluation:  Dietary intake, exercise, and body weight. Follow up in 1 months for 22 month post-op visit.

## 2015-08-26 ENCOUNTER — Ambulatory Visit: Payer: Self-pay | Admitting: Dietician

## 2015-09-14 ENCOUNTER — Ambulatory Visit: Payer: Self-pay | Admitting: Dietician

## 2016-02-01 ENCOUNTER — Ambulatory Visit: Payer: BLUE CROSS/BLUE SHIELD

## 2016-06-08 ENCOUNTER — Ambulatory Visit (INDEPENDENT_AMBULATORY_CARE_PROVIDER_SITE_OTHER): Payer: BLUE CROSS/BLUE SHIELD

## 2016-06-08 ENCOUNTER — Ambulatory Visit (INDEPENDENT_AMBULATORY_CARE_PROVIDER_SITE_OTHER): Payer: BLUE CROSS/BLUE SHIELD | Admitting: Physician Assistant

## 2016-06-08 VITALS — BP 114/74 | HR 74 | Temp 98.3°F | Ht 60.0 in | Wt 203.4 lb

## 2016-06-08 DIAGNOSIS — M25532 Pain in left wrist: Secondary | ICD-10-CM

## 2016-06-08 DIAGNOSIS — Z23 Encounter for immunization: Secondary | ICD-10-CM

## 2016-06-08 MED ORDER — PREDNISONE 20 MG PO TABS: ORAL_TABLET | ORAL | 0 refills | Status: DC

## 2016-06-08 NOTE — Patient Instructions (Addendum)
For pain, I would like you to start using both a wrist splint and elbow splint every night.  The wrist splint you can purchase at [facility]  and for the elbow you can use a towel loosely wrapped over the elbow to keep it straight at night. Use ice to both the wrist and elbow daily as this will help with inflammation. I would also like you to start prednisone for the next ten days. It will be 40mg  for 5 days followed by 20mg  for 5 days.   I have also put in a referral for PT. They should contact you soon. If symptoms still persisting in 2-4 weeks, please return to clinic as you may need referral to neuro at that time. If anything worsens, seek care sooner.    Cubital Tunnel Syndrome Cubital tunnel syndrome is a condition that causes pain and weakness of the forearm and hand. This condition happens when one of the nerves (ulnar nerve) that runs alongside the elbow joint becomes irritated. What are the causes? Causes of this condition include:  Increased pressure on the ulnar nerve at the elbow, arm, or forearm. This can be caused by:  Swollen tissues.  Ligaments.  Muscles.  Poorly healed elbow fractures.  Tumors in the elbow. These are usually noncancerous (benign).  Scar tissue that develops in the elbow after an injury.  Bony growths (spurs) near the ulnar nerve.  Stretching of the nerve due to loose elbow ligaments.  Trauma to the nerve at the elbow.  Repetitive elbow bending.  Certain medical conditions. What increases the risk? This condition is more likely to develop in:  People who do manual labor that requires frequently bending the elbow.  People who play sports that include repeated or strenuous throwing motions, such as baseball.  People who play contact sports, such as football or lacrosse.  People who do not warm up properly before activities.  People who have diabetes.  People who have an underactive thyroid (hypothyroidism). What are the signs or  symptoms? Symptoms of this condition include:  Clumsiness or weakness of the hand.  Tenderness of the inner elbow.  Aching or soreness of the inner elbow, forearm, or fingers, especially the little finger or the ring finger.  Increased pain with forced elbow bending.  Reduced control when throwing.  Tingling, numbness, or burning inside the forearm, or in part of the hand or fingers, especially the little finger or the ring finger.  Sharp pains that shoot from the elbow down to the wrist and hand.  The inability to grip or pinch hard. How is this diagnosed? This condition is diagnosed with a medical history and physical exam. Your health care provider will ask about your symptoms and ask for details about any injury. You may also have other tests, including:  Electromyogram (EMG). This test checks how well the nerve is working.  X-ray. How is this treated? Treatment starts by stopping the activities that are causing your symptoms to get worse. Treatment may include the use of icing and medicines to reduce pain and swelling. You may also be advised to wear a splint to prevent your elbow from bending or wear an elbow pad where the ulnar nerve is closest to the skin. In less severe cases, treatment may also include working with a physical therapist:  To help decrease your symptoms.  To improve the strength and range of motion of your elbow, forearm, and hand. If the treatments described above do not help, surgery may be needed.  Follow these instructions at home: If you have a splint:   Wear it as told by your health care provider. Remove it only as told by your health care provider.  Loosen the splint if your fingers become numb and tingle, or if they turn cold and blue.  Keep the splint clean and dry. Managing pain, stiffness, and swelling   If directed, apply ice to the injured area:  Put ice in a plastic bag.  Place a towel between your skin and the bag.  Leave the ice  on for 20 minutes, 2-3 times per day.  Move your fingers often to avoid stiffness and to lessen swelling.  Raise (elevate) the injured area above the level of your heart while you are sitting or lying down. General instructions   Take over-the-counter and prescription medicines only as told by your health care provider.  Keep all follow-up visits as told by your health care provider. This is important.  Do any exercise or physical therapy as told by your health care provider.  Do not drive or operate heavy machinery while taking prescription pain medicine.  If you were given an elbow pad, wear it as told by your health care provider. Contact a health care provider if:  Your symptoms get worse.  Your symptoms do not get better with treatment.  Your have new pain.  Your hand on the injured side feels numb or cold. This information is not intended to replace advice given to you by your health care provider. Make sure you discuss any questions you have with your health care provider. Document Released: 03/21/2005 Document Revised: 08/27/2015 Document Reviewed: 05/28/2014 Elsevier Interactive Patient Education  2017 [place] Syndrome Carpal tunnel syndrome is a condition that causes pain in your hand and arm. The carpal tunnel is a narrow area that is on the palm side of your wrist. Repeated wrist motion or certain diseases may cause swelling in the tunnel. This swelling can pinch the main nerve in the wrist (median nerve). Follow these instructions at home: If you have a splint:   Wear it as told by your doctor. Remove it only as told by your doctor.  Loosen the splint if your fingers:  Become numb and tingle.  Turn blue and cold.  Keep the splint clean and dry. General instructions   Take over-the-counter and prescription medicines only as told by your doctor.  Rest your wrist from any activity that may be causing your pain. If needed, talk to your  employer about changes that can be made in your work, such as getting a wrist pad to use while typing.  If directed, apply ice to the painful area:  Put ice in a plastic bag.  Place a towel between your skin and the bag.  Leave the ice on for 20 minutes, 2-3 times per day.  Keep all follow-up visits as told by your doctor. This is important.  Do any exercises as told by your doctor, physical therapist, or occupational therapist. Contact a doctor if:  You have new symptoms.  Medicine does not help your pain.  Your symptoms get worse. This information is not intended to replace advice given to you by your health care provider. Make sure you discuss any questions you have with your health care provider. Document Released: 03/10/2011 Document Revised: 08/27/2015 Document Reviewed: 08/06/2014 Elsevier Interactive Patient Education  2017 [employer].   IF you received an x-ray today, you will receive an invoice from  [facility]. Please contact [facility] at [phone] with questions or concerns regarding your invoice.   IF you received labwork today, you will receive an invoice from [place]. Please contact [facility] at [phone] with questions or concerns regarding your invoice.   Our billing staff will not be able to assist you with questions regarding bills from these companies.  You will be contacted with the lab results as soon as they are available. The fastest way to get your results is to activate your My Chart account. Instructions are located on the last page of this paperwork. If you have not heard from [place] regarding the results in 2 weeks, please contact this office.

## 2016-06-08 NOTE — Progress Notes (Signed)
[name] [name]  MRN: [id number] DOB: November 30, 1975  Subjective:  [name] [name] is a 41 y.o. female seen in office today for a chief complaint of left wrist pain x years. However since 3 days ago, she was having worsening pain in the wrist. Has associated tingling in 2nd-5th digits and decreased strength if she holds it in an extended position for long periods of time. Denies dropping objects with left hand, acute injury, pallor, and numbness. 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 The pain is worsened with movements and lifting. She will often times wake up in the middle of the night with tingling sensation in her hand and will have to lift her arm above her head to provide relief. Pain is better when she does not use it.  She uses tylenol for pain as she had gastric sleeve and cannot use NSAIDs.  Review of Systems  Constitutional: Negative for chills, diaphoresis and fever.  Neurological: Negative for dizziness, syncope, facial asymmetry, speech difficulty and light-headedness.    Patient Active Problem List   Diagnosis Date Noted  . 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 Gallstones 10/15/2013  . Morbid obesity ([place]) 09/21/2012  . Asthma 05/14/2011    Current Outpatient Prescriptions on File Prior to Visit  Medication Sig Dispense Refill  . albuterol (PROVENTIL HFA;VENTOLIN HFA) 108 (90 BASE) MCG/ACT inhaler Inhale 1 puff into the lungs every 6 (six) hours as needed for wheezing or shortness of breath. 1 Inhaler 5  . Antiseborrheic Products, Misc. (PROMISEB) CREA Apply 1 application topically every morning.    . clobetasol (TEMOVATE) 0.05 % external solution Apply 1 application topically daily as needed (Itching). To scalp    . benzonatate (TESSALON) 100 MG capsule Take 1 capsule (100 mg total) by mouth 3 (three) times daily as needed for cough. (Patient not taking: Reported on 06/08/2016) 20 capsule 0  . mometasone (ASMANEX) 220 MCG/INH  inhaler Inhale 1 puff into the lungs daily. Rinse mouth with water after using. (Patient not taking: Reported on 06/08/2016) 1 Inhaler 11  . predniSONE (DELTASONE) 20 MG tablet Take 2 tablets (40 mg total) by mouth daily with breakfast. (Patient not taking: Reported on 06/08/2016) 6 tablet 0   Current Facility-Administered Medications on File Prior to Visit  Medication Dose Route Frequency Provider Last Rate Last Dose  . albuterol (PROVENTIL) (2.5 MG/3ML) 0.083% nebulizer solution 2.5 mg  2.5 mg Nebulization Once Thao P Le, DO        Allergies  Allergen Reactions  . Other Other (See Comments)    Nut allergy rash, tongue swells  . Amoxicillin Rash  . Codeine Nausea And Vomiting and Rash  . Tramadol Nausea And Vomiting and Rash     Objective:  BP 114/74 (BP Location: Right Arm, Patient Position: Sitting, Cuff Size: Large)   Pulse 74   Temp 98.3 F (36.8 C) (Oral)   Ht 5' (1.524 m)   Wt 203 lb 6.4 oz (92.3 kg)   LMP 05/15/2016 (Exact Date)   SpO2 97%   BMI 39.72 kg/m   Physical Exam  Constitutional: She is oriented to person, place, and time and well-developed, well-nourished, and in no distress.  HENT:  Head: Normocephalic and atraumatic.  Eyes: Conjunctivae are normal.  Neck: Normal range of motion and full passive range of motion without pain. No spinous process tenderness present. Normal range of motion present.  Cardiovascular:  Pulses:      Radial pulses are 2+  on the right side, and 2+ on the left side.  Pulmonary/Chest: Effort normal.  Musculoskeletal:       Left elbow: Normal.       Left wrist: She exhibits tenderness (with deep palpation of posterior aspect of wrist and with flexion of the wrist). She exhibits normal range of motion and no swelling.  Neurological: She is alert and oriented to person, place, and time. She has normal strength. She displays no atrophy and no tremor. Gait normal.  Negative Tinels and Phalens.  Cap refill < 2 sec  Skin: Skin is warm and  dry.  Psychiatric: Affect normal.  Vitals reviewed.    Dg Wrist Complete Left  Result Date: 06/08/2016 CLINICAL DATA:  Left wrist pain for 4 days with tingling in the fingers. No known injury. EXAM: LEFT WRIST - COMPLETE 3+ VIEW COMPARISON:  Plain films left wrist 12/05/2005. FINDINGS: There is no evidence of fracture or dislocation. There is no evidence of arthropathy or other focal bone abnormality. Mild ulnar minus variance is unchanged. Soft tissues are unremarkable. IMPRESSION: Negative exam. Electronically Signed   By: Inge Rise M.D.   On: 06/08/2016 11:11   Assessment and Plan :  1. Left wrist pain -History suspicious for possible carpal tunnel or cubital tunnel syndrome considering pt's occupation. PE findings unremarkable except for pain with deep palpation of the wrist. Tingling sensation could not be elicited. Pt had gastric sleeve and can therefore not use NSAIDs for inflammation. Will treat with prednisone, night splints, and referral to PT. Instructed to RTC if sx worsen or do not improve with physical therapy.  - DG Wrist Complete Left; Future - Ambulatory referral to Physical Therapy - predniSONE (DELTASONE) 20 MG tablet; 2-2-2-2-2-1-1-1-1-1. Take tablets in the am with food.  Dispense: 15 tablet; Refill: 0  2. Need for prophylactic vaccination and inoculation against influenza - Flu Vaccine QUAD 36+ mos IM  Tenna Delaine PA-C  [facility] 06/08/2016 11:15 AM

## 2016-06-16 ENCOUNTER — Ambulatory Visit: Payer: BLUE CROSS/BLUE SHIELD

## 2016-06-23 ENCOUNTER — Other Ambulatory Visit: Payer: Self-pay | Admitting: *Deleted

## 2016-06-23 ENCOUNTER — Encounter: Payer: Self-pay | Admitting: Neurology

## 2016-06-23 DIAGNOSIS — M25532 Pain in left wrist: Secondary | ICD-10-CM

## 2016-06-28 ENCOUNTER — Ambulatory Visit (INDEPENDENT_AMBULATORY_CARE_PROVIDER_SITE_OTHER): Payer: BLUE CROSS/BLUE SHIELD | Admitting: Neurology

## 2016-06-28 DIAGNOSIS — G5602 Carpal tunnel syndrome, left upper limb: Secondary | ICD-10-CM

## 2016-06-28 DIAGNOSIS — M25532 Pain in left wrist: Secondary | ICD-10-CM

## 2016-06-28 NOTE — Procedures (Signed)
[facility]  [place] [place], [place]  [place], [place] [address] Tel: [phone] Fax:  [phone] Test Date:  06/28/2016  Patient: [name] [name] DOB: Jan 21, 1976 Physician: Narda Amber, DO  Sex: Female Height: 5' " Ref Phys: Flossie Dibble, M.D.  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  NCV & EMG Findings: Extensive electrodiagnostic testing of the left upper extremities shows: 1. Left mixed palmer sensory responses are abnormal. Left median and ulnar sensory responses are normal.   2. Left median and ulnar motor responses are within normal limits. 3. There is no evidence of active or motor axon loss affecting any of the tested muscles.  Motor unit configuration and recruitment is within normal limits.  Impression: 1. Left median neuropathy at or distal to the wrist, consistent with clinical diagnosis of carpal tunnel syndrome. Overall, these findings are very mild in degree electrically. 2. There is no evidence of cervical radiculopathy affecting the left upper extremity.   ___________________________ Narda Amber, DO    Nerve Conduction Studies Anti Sensory Summary Table   Stim Site NR Peak (ms) Norm Peak (ms) P-T Amp (V) Norm P-T Amp  Left Median Anti Sensory (2nd Digit)  Wrist    3.3 <3.4 40.2 >20  Left Ulnar Anti Sensory (5th Digit)  Wrist    3.0 <3.1 17.4 >12   Motor Summary Table   Stim Site NR Onset (ms) Norm Onset (ms) O-P Amp (mV) Norm O-P Amp Site1 Site2 Delta-0 (ms) Dist (cm) Vel (m/s) Norm Vel (m/s)  Left Median Motor (Abd Poll Brev)  Wrist    3.4 <3.9 8.8 >6 Elbow Wrist 4.5 25.5 57 >50  Elbow    7.9  8.7         Left Ulnar Motor (Abd Dig Minimi)  Wrist    2.6 <3.1 8.7 >7 B Elbow Wrist 4.3 23.5 55 >50  B Elbow    6.9  8.2  A Elbow B Elbow 1.7 10.0 59 >50  A Elbow    8.6  7.5           Comparison Summary Table   Stim Site NR Peak (ms) Norm Peak (ms) P-T Amp (V) Site1 Site2 Delta-P (ms) Norm Delta (ms)  Left Median/Ulnar Palm Comparison (Wrist - 8cm)  Median Palm    2.2 <2.2 44.6 Median Palm Ulnar Palm 0.0   Ulnar Palm    2.2 <2.2 12.2       EMG   Side Muscle Ins Act Fibs Psw Fasc Number Recrt Dur Dur. Amp Amp. Poly Poly. Comment  Left 1stDorInt Nml Nml Nml Nml Nml Nml Nml Nml Nml Nml Nml Nml [name]  Left Biceps Nml Nml Nml Nml Nml Nml Nml Nml Nml Nml Nml Nml [name]  Left Abd Poll Brev Nml Nml Nml Nml Nml Nml Nml Nml Nml Nml Nml Nml [name]  Left Ext Indicis Nml Nml Nml Nml Nml Nml Nml Nml Nml Nml Nml Nml [name]  Left Triceps Nml Nml Nml Nml Nml Nml Nml Nml Nml Nml Nml Nml [name]  Left Deltoid Nml Nml Nml Nml Nml Nml Nml Nml Nml Nml Nml Nml [name]  Left PronatorTeres Nml Nml Nml Nml Nml Nml Nml Nml Nml Nml Nml Nml [name]  Left ExtCarRadLong Nml Nml Nml Nml Nml Nml Nml Nml Nml Nml Nml Nml [name]  Left Cervical Parasp Low Nml Nml  Nml Nml Nml Nml Nml Nml Nml Nml Nml Nml [name]      Waveforms:

## 2016-10-11 ENCOUNTER — Other Ambulatory Visit: Payer: Self-pay | Admitting: Physical Medicine and Rehabilitation

## 2016-10-11 DIAGNOSIS — M542 Cervicalgia: Secondary | ICD-10-CM

## 2016-10-11 DIAGNOSIS — M79603 Pain in arm, unspecified: Secondary | ICD-10-CM

## 2016-10-20 ENCOUNTER — Ambulatory Visit
Admission: RE | Admit: 2016-10-20 | Discharge: 2016-10-20 | Disposition: A | Discharge status: Home / Self Care | Payer: BLUE CROSS/BLUE SHIELD | Source: Ambulatory Visit | Attending: Physical Medicine and Rehabilitation | Admitting: Physical Medicine and Rehabilitation

## 2016-10-20 DIAGNOSIS — M542 Cervicalgia: Secondary | ICD-10-CM

## 2016-10-20 DIAGNOSIS — M79603 Pain in arm, unspecified: Secondary | ICD-10-CM

## 2016-11-14 ENCOUNTER — Other Ambulatory Visit: Payer: Self-pay | Admitting: Neurosurgery

## 2016-11-14 DIAGNOSIS — M47812 Spondylosis without myelopathy or radiculopathy, cervical region: Secondary | ICD-10-CM

## 2016-11-16 ENCOUNTER — Ambulatory Visit
Admission: RE | Admit: 2016-11-16 | Discharge: 2016-11-16 | Disposition: A | Discharge status: Home / Self Care | Payer: BLUE CROSS/BLUE SHIELD | Source: Ambulatory Visit | Attending: Neurosurgery | Admitting: Neurosurgery

## 2016-11-16 DIAGNOSIS — M47812 Spondylosis without myelopathy or radiculopathy, cervical region: Secondary | ICD-10-CM

## 2016-11-16 MED ORDER — TRIAMCINOLONE ACETONIDE 40 MG/ML IJ SUSP (RADIOLOGY)
60.0000 mg | Freq: Once | INTRAMUSCULAR | Status: AC
  Administered 2016-11-16: 60 mg via EPIDURAL

## 2016-11-16 MED ORDER — IOPAMIDOL (ISOVUE-M 300) INJECTION 61%
1.0000 mL | Freq: Once | INTRAMUSCULAR | Status: AC | PRN
  Administered 2016-11-16: 1 mL via EPIDURAL

## 2016-11-16 NOTE — Discharge Instructions (Signed)

## 2016-11-22 ENCOUNTER — Other Ambulatory Visit: Payer: Self-pay | Admitting: Neurosurgery

## 2016-11-22 DIAGNOSIS — M47812 Spondylosis without myelopathy or radiculopathy, cervical region: Secondary | ICD-10-CM

## 2016-12-09 ENCOUNTER — Ambulatory Visit
Admission: RE | Admit: 2016-12-09 | Discharge: 2016-12-09 | Disposition: A | Discharge status: Home / Self Care | Payer: BLUE CROSS/BLUE SHIELD | Source: Ambulatory Visit | Attending: Neurosurgery | Admitting: Neurosurgery

## 2016-12-09 DIAGNOSIS — M47812 Spondylosis without myelopathy or radiculopathy, cervical region: Secondary | ICD-10-CM

## 2016-12-09 MED ORDER — TRIAMCINOLONE ACETONIDE 40 MG/ML IJ SUSP (RADIOLOGY)
60.0000 mg | Freq: Once | INTRAMUSCULAR | Status: AC
  Administered 2016-12-09: 60 mg via EPIDURAL

## 2016-12-09 MED ORDER — IOPAMIDOL (ISOVUE-M 300) INJECTION 61%
1.0000 mL | Freq: Once | INTRAMUSCULAR | Status: AC | PRN
  Administered 2016-12-09: 1 mL via EPIDURAL

## 2016-12-09 NOTE — Discharge Instructions (Signed)

## 2016-12-21 ENCOUNTER — Other Ambulatory Visit: Payer: Self-pay | Admitting: Neurosurgery

## 2016-12-21 DIAGNOSIS — M47812 Spondylosis without myelopathy or radiculopathy, cervical region: Secondary | ICD-10-CM

## 2016-12-26 ENCOUNTER — Encounter: Payer: Self-pay | Admitting: Physician Assistant

## 2016-12-26 ENCOUNTER — Ambulatory Visit (INDEPENDENT_AMBULATORY_CARE_PROVIDER_SITE_OTHER): Payer: BLUE CROSS/BLUE SHIELD | Admitting: Physician Assistant

## 2016-12-26 VITALS — BP 116/80 | HR 82 | Temp 98.3°F | Resp 16 | Ht 61.0 in | Wt 214.6 lb

## 2016-12-26 DIAGNOSIS — N926 Irregular menstruation, unspecified: Secondary | ICD-10-CM

## 2016-12-26 DIAGNOSIS — R5383 Other fatigue: Secondary | ICD-10-CM

## 2016-12-26 DIAGNOSIS — Z23 Encounter for immunization: Secondary | ICD-10-CM | POA: Diagnosis not present

## 2016-12-26 NOTE — Progress Notes (Signed)
[name] [name]  MRN: [id number] DOB: Mar 05, 1976  PCP: Glenford Bayley, DO  Subjective:  Pt is a 41 year old female who presents to clinic for abnormal period.   She has had her period twice this month. This is the first time this has occurred. Endorses a few episodes of fatigue, lightheadedness. She would like her iron checked.  Endorses some hot flashes, only at night. No hair or skin changes. She has "really bad" chocolate cravings recently.  Her mother went through menopause in her early 31's. Mother also had thyroid disease.  She is not UTD PAP - wants to come back next month. Denies abdominal pain, cramping, vaginal discharge, fever, chills.   Review of Systems  Constitutional: Negative for chills, diaphoresis, fatigue and fever.  Genitourinary: Positive for menstrual problem. Negative for pelvic pain, vaginal bleeding, vaginal discharge and vaginal pain.  Neurological: Positive for dizziness and light-headedness. Negative for syncope.  Psychiatric/Behavioral: Negative for behavioral problems.    Patient Active Problem List   Diagnosis Date Noted  . 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 Gallstones 10/15/2013  . Morbid obesity ([place]) 09/21/2012  . Asthma 05/14/2011    Current Outpatient Prescriptions on File Prior to Visit  Medication Sig Dispense Refill  . albuterol (PROVENTIL HFA;VENTOLIN HFA) 108 (90 BASE) MCG/ACT inhaler Inhale 1 puff into the lungs every 6 (six) hours as needed for wheezing or shortness of breath. 1 Inhaler 5  . clobetasol (TEMOVATE) 0.05 % external solution Apply 1 application topically daily as needed (Itching). To scalp    . Antiseborrheic Products, Misc. (PROMISEB) CREA Apply 1 application topically every morning.    . mometasone (ASMANEX) 220 MCG/INH inhaler Inhale 1 puff into the lungs daily. Rinse mouth with water after using. (Patient not taking: Reported on 06/08/2016) 1 Inhaler 11   Current Facility-Administered  Medications on File Prior to Visit  Medication Dose Route Frequency Provider Last Rate Last Dose  . albuterol (PROVENTIL) (2.5 MG/3ML) 0.083% nebulizer solution 2.5 mg  2.5 mg Nebulization Once Le, Thao P, DO        Allergies  Allergen Reactions  . Other Other (See Comments)    Nut allergy rash, tongue swells  . Amoxicillin Rash  . Codeine Nausea And Vomiting and Rash  . Tramadol Nausea And Vomiting and Rash     Objective:  BP 116/80   Pulse 82   Temp 98.3 F (36.8 C) (Oral)   Resp 16   Ht 5\' 1"  (1.549 m)   Wt 214 lb 9.6 oz (97.3 kg)   LMP 12/16/2016   SpO2 98%   BMI 40.55 kg/m   Physical Exam  Constitutional: She is oriented to person, place, and time and well-developed, well-nourished, and in no distress. No distress.  obese  Neck: No thyroid mass and no thyromegaly present.  Cardiovascular: Normal rate, regular rhythm and normal heart sounds.   Neurological: She is alert and oriented to person, place, and time. GCS score is 15.  Skin: Skin is warm and dry. She is not diaphoretic.  Psychiatric: Mood, memory, affect and judgment normal.  Vitals reviewed.   Assessment and Plan :  1. Abnormal menstrual periods 2. Fatigue, unspecified type 3. Morbid obesity ([facility]CC) - Iron, TIBC and Ferritin Panel - CBC with Differential/Platelet - Vitamin B12 - TSH - Urine pregnancy, hcg - Pt c/o metromenorrhagia. Suspect she is perimenopausal. Labs are pending, will contact with results. She is past due for PAP. She plans to RTC next month  for PAP.   4. Need for prophylactic vaccination and inoculation against influenza - Flu Vaccine QUAD 36+ mos IM  Mercer Pod, PA-C  [facility] 12/26/2016 2:53 PM

## 2016-12-26 NOTE — Patient Instructions (Addendum)
We will call you with the results of today's lab work.  Download the "calm" app.  Thank you for coming in today. I hope you feel we met your needs.  Feel free to call PCP if you have any questions or further requests.  Please consider signing up for MyChart if you do not already have it, as this is a great way to communicate with me.  Best,  Whitney McVey, PA-C   IF you received an x-ray today, you will receive an invoice from [facility]. Please contact [facility] at [phone] with questions or concerns regarding your invoice.   IF you received labwork today, you will receive an invoice from [facility]. Please contact [facility] at [phone] with questions or concerns regarding your invoice.   Our billing staff will not be able to assist you with questions regarding bills from these companies.  You will be contacted with the lab results as soon as they are available. The fastest way to get your results is to activate your My Chart account. Instructions are located on the last page of this paperwork. If you have not heard from [place] regarding the results in 2 weeks, please contact this office.

## 2016-12-27 LAB — CBC WITH DIFFERENTIAL/PLATELET
Basophils Absolute: 0 10*3/uL (ref 0.0–0.2)
Basos: 0 %
EOS (ABSOLUTE): 0.1 10*3/uL (ref 0.0–0.4)
Eos: 1 %
Hematocrit: 41.4 % (ref 34.0–46.6)
Hemoglobin: 13.6 g/dL (ref 11.1–15.9)
Immature Grans (Abs): 0 10*3/uL (ref 0.0–0.1)
Immature Granulocytes: 0 %
Lymphocytes Absolute: 2.5 10*3/uL (ref 0.7–3.1)
Lymphs: 29 %
MCH: 32.8 pg (ref 26.6–33.0)
MCHC: 32.9 g/dL (ref 31.5–35.7)
MCV: 100 fL — ABNORMAL HIGH (ref 79–97)
Monocytes Absolute: 0.6 10*3/uL (ref 0.1–0.9)
Monocytes: 7 %
Neutrophils Absolute: 5.5 10*3/uL (ref 1.4–7.0)
Neutrophils: 63 %
Platelets: 293 10*3/uL (ref 150–379)
RBC: 4.15 x10E6/uL (ref 3.77–5.28)
RDW: 12.7 % (ref 12.3–15.4)
WBC: 8.8 10*3/uL (ref 3.4–10.8)

## 2016-12-27 LAB — VITAMIN B12: Vitamin B-12: 944 pg/mL (ref 232–1245)

## 2016-12-27 LAB — IRON,TIBC AND FERRITIN PANEL
Ferritin: 183 ng/mL — ABNORMAL HIGH (ref 15–150)
Iron Saturation: 13 % — ABNORMAL LOW (ref 15–55)
Iron: 40 ug/dL (ref 27–159)
Total Iron Binding Capacity: 315 ug/dL (ref 250–450)
UIBC: 275 ug/dL (ref 131–425)

## 2016-12-27 LAB — TSH: TSH: 2.65 u[IU]/mL (ref 0.450–4.500)

## 2016-12-29 ENCOUNTER — Encounter: Payer: Self-pay | Admitting: Physician Assistant

## 2016-12-29 NOTE — Progress Notes (Signed)
Please call pt and let her know her hemoglobin and hematocrit levels are within normal limits, which means there is no sign of anemia. B12 level and thyroid looks great. Her  iron level is within normal limits.  Come back and see me next month for your PAP smear. We will repeat the anemia panel and discuss a plan if you are continuing to have these symptoms. I sent a letter in the mail with these lab results.  Thank you!

## 2016-12-30 ENCOUNTER — Ambulatory Visit
Admission: RE | Admit: 2016-12-30 | Discharge: 2016-12-30 | Disposition: A | Discharge status: Home / Self Care | Payer: BLUE CROSS/BLUE SHIELD | Source: Ambulatory Visit | Attending: Neurosurgery | Admitting: Neurosurgery

## 2016-12-30 DIAGNOSIS — M47812 Spondylosis without myelopathy or radiculopathy, cervical region: Secondary | ICD-10-CM

## 2016-12-30 MED ORDER — TRIAMCINOLONE ACETONIDE 40 MG/ML IJ SUSP (RADIOLOGY)
60.0000 mg | Freq: Once | INTRAMUSCULAR | Status: AC
  Administered 2016-12-30: 60 mg via EPIDURAL

## 2016-12-30 MED ORDER — IOPAMIDOL (ISOVUE-M 300) INJECTION 61%
1.0000 mL | Freq: Once | INTRAMUSCULAR | Status: AC | PRN
  Administered 2016-12-30: 1 mL via EPIDURAL

## 2016-12-30 NOTE — Progress Notes (Signed)
Letter sent.

## 2016-12-31 ENCOUNTER — Ambulatory Visit: Payer: BLUE CROSS/BLUE SHIELD | Admitting: Urgent Care

## 2017-01-25 ENCOUNTER — Ambulatory Visit: Payer: BLUE CROSS/BLUE SHIELD | Admitting: Physician Assistant

## 2017-02-01 ENCOUNTER — Ambulatory Visit (INDEPENDENT_AMBULATORY_CARE_PROVIDER_SITE_OTHER): Payer: BLUE CROSS/BLUE SHIELD | Admitting: Physician Assistant

## 2017-02-01 ENCOUNTER — Encounter: Payer: Self-pay | Admitting: Physician Assistant

## 2017-02-01 VITALS — BP 112/81 | HR 75 | Temp 97.8°F | Resp 16 | Ht 60.0 in | Wt 216.2 lb

## 2017-02-01 DIAGNOSIS — Z124 Encounter for screening for malignant neoplasm of cervix: Secondary | ICD-10-CM | POA: Diagnosis not present

## 2017-02-01 DIAGNOSIS — Z01419 Encounter for gynecological examination (general) (routine) without abnormal findings: Secondary | ICD-10-CM | POA: Diagnosis not present

## 2017-02-01 NOTE — Patient Instructions (Addendum)
We will contact you with the results of your PAP.  See below for routine health maintenance.   Thank you for coming in today. I hope you feel we met your needs.  Feel free to call PCP if you have any questions or further requests.  Please consider signing up for MyChart if you do not already have it, as this is a great way to communicate with me.  Best,  Whitney McVey, PA-C   Health Maintenance, Female Adopting a healthy lifestyle and getting preventive care can go a long way to promote health and wellness. Talk with your health care provider about what schedule of regular examinations is right for you. This is a good chance for you to check in with your provider about disease prevention and staying healthy. In between checkups, there are plenty of things you can do on your own. Experts have done a lot of research about which lifestyle changes and preventive measures are most likely to keep you healthy. Ask your health care provider for more information. Weight and diet Eat a healthy diet  Be sure to include plenty of vegetables, fruits, low-fat dairy products, and lean protein.  Do not eat a lot of foods high in solid fats, added sugars, or salt.  Get regular exercise. This is one of the most important things you can do for your health. ? Most adults should exercise for at least 150 minutes each week. The exercise should increase your heart rate and make you sweat (moderate-intensity exercise). ? Most adults should also do strengthening exercises at least twice a week. This is in addition to the moderate-intensity exercise.  Maintain a healthy weight  Body mass index (BMI) is a measurement that can be used to identify possible weight problems. It estimates body fat based on height and weight. Your health care provider can help determine your BMI and help you achieve or maintain a healthy weight.  For females 66 years of age and older: ? A BMI below 18.5 is considered underweight. ? A  BMI of 18.5 to 24.9 is normal. ? A BMI of 25 to 29.9 is considered overweight. ? A BMI of 30 and above is considered obese.  Watch levels of cholesterol and blood lipids  You should start having your blood tested for lipids and cholesterol at 42 years of age, then have this test every 5 years.  You may need to have your cholesterol levels checked more often if: ? Your lipid or cholesterol levels are high. ? You are older than 42 years of age. ? You are at high risk for heart disease.  Cancer screening Lung Cancer  Lung cancer screening is recommended for adults 1-9 years old who are at high risk for lung cancer because of a history of smoking.  A yearly low-dose CT scan of the lungs is recommended for people who: ? Currently smoke. ? Have quit within the past 15 years. ? Have at least a 30-pack-year history of smoking. A pack year is smoking an average of one pack of cigarettes a day for 1 year.  Yearly screening should continue until it has been 15 years since you quit.  Yearly screening should stop if you develop a health problem that would prevent you from having lung cancer treatment.  Breast Cancer  Practice breast self-awareness. This means understanding how your breasts normally appear and feel.  It also means doing regular breast self-exams. Let your health care provider know about any changes, no matter how small.  If you are in your 20s or 30s, you should have a clinical breast exam (CBE) by a health care provider every 1-3 years as part of a regular health exam.  If you are 76 or older, have a CBE every year. Also consider having a breast X-ray (mammogram) every year.  If you have a family history of breast cancer, talk to your health care provider about genetic screening.  If you are at high risk for breast cancer, talk to your health care provider about having an MRI and a mammogram every year.  Breast cancer gene (BRCA) assessment is recommended for women who  have family members with BRCA-related cancers. BRCA-related cancers include: ? Breast. ? Ovarian. ? Tubal. ? Peritoneal cancers.  Results of the assessment will determine the need for genetic counseling and BRCA1 and BRCA2 testing.  Cervical Cancer Your health care provider may recommend that you be screened regularly for cancer of the pelvic organs (ovaries, uterus, and vagina). This screening involves a pelvic examination, including checking for microscopic changes to the surface of your cervix (Pap test). You may be encouraged to have this screening done every 3 years, beginning at age 76.  For women ages 25-65, health care providers may recommend pelvic exams and Pap testing every 3 years, or they may recommend the Pap and pelvic exam, combined with testing for human papilloma virus (HPV), every 5 years. Some types of HPV increase your risk of cervical cancer. Testing for HPV may also be done on women of any age with unclear Pap test results.  Other health care providers may not recommend any screening for nonpregnant women who are considered low risk for pelvic cancer and who do not have symptoms. Ask your health care provider if a screening pelvic exam is right for you.  If you have had past treatment for cervical cancer or a condition that could lead to cancer, you need Pap tests and screening for cancer for at least 20 years after your treatment. If Pap tests have been discontinued, your risk factors (such as having a new sexual partner) need to be reassessed to determine if screening should resume. Some women have medical problems that increase the chance of getting cervical cancer. In these cases, your health care provider may recommend more frequent screening and Pap tests.  Colorectal Cancer  This type of cancer can be detected and often prevented.  Routine colorectal cancer screening usually begins at 42 years of age and continues through 42 years of age.  Your health care  provider may recommend screening at an earlier age if you have risk factors for colon cancer.  Your health care provider may also recommend using home test kits to check for hidden blood in the stool.  A small camera at the end of a tube can be used to examine your colon directly (sigmoidoscopy or colonoscopy). This is done to check for the earliest forms of colorectal cancer.  Routine screening usually begins at age 43.  Direct examination of the colon should be repeated every 5-10 years through 42 years of age. However, you may need to be screened more often if early forms of precancerous polyps or small growths are found.  Skin Cancer  Check your skin from head to toe regularly.  Tell your health care provider about any new moles or changes in moles, especially if there is a change in a mole's shape or color.  Also tell your health care provider if you have a mole that is  larger than the size of a pencil eraser.  Always use sunscreen. Apply sunscreen liberally and repeatedly throughout the day.  Protect yourself by wearing long sleeves, pants, a wide-brimmed hat, and sunglasses whenever you are outside.  Heart disease, diabetes, and high blood pressure  High blood pressure causes heart disease and increases the risk of stroke. High blood pressure is more likely to develop in: ? People who have blood pressure in the high end of the normal range (130-139/85-89 mm Hg). ? People who are overweight or obese. ? People who are African American.  If you are 73-36 years of age, have your blood pressure checked every 3-5 years. If you are 52 years of age or older, have your blood pressure checked every year. You should have your blood pressure measured twice-once when you are at a hospital or clinic, and once when you are not at a hospital or clinic. Record the average of the two measurements. To check your blood pressure when you are not at a hospital or clinic, you can use: ? An automated  blood pressure machine at a pharmacy. ? A home blood pressure monitor.  If you are between 46 years and 7 years old, ask your health care provider if you should take aspirin to prevent strokes.  Have regular diabetes screenings. This involves taking a blood sample to check your fasting blood sugar level. ? If you are at a normal weight and have a low risk for diabetes, have this test once every three years after 42 years of age. ? If you are overweight and have a high risk for diabetes, consider being tested at a younger age or more often. Preventing infection Hepatitis B  If you have a higher risk for hepatitis B, you should be screened for this virus. You are considered at high risk for hepatitis B if: ? You were born in a country where hepatitis B is common. Ask your health care provider which countries are considered high risk. ? Your parents were born in a high-risk country, and you have not been immunized against hepatitis B (hepatitis B vaccine). ? You have HIV or AIDS. ? You use needles to inject street drugs. ? You live with someone who has hepatitis B. ? You have had sex with someone who has hepatitis B. ? You get hemodialysis treatment. ? You take certain medicines for conditions, including cancer, organ transplantation, and autoimmune conditions.  Hepatitis C  Blood testing is recommended for: ? Everyone born from [birth year] through 1966. ? Anyone with known risk factors for hepatitis C.  Sexually transmitted infections (STIs)  You should be screened for sexually transmitted infections (STIs) including gonorrhea and chlamydia if: ? You are sexually active and are younger than 42 years of age. ? You are older than 42 years of age and your health care provider tells you that you are at risk for this type of infection. ? Your sexual activity has changed since you were last screened and you are at an increased risk for chlamydia or gonorrhea. Ask your health care provider if you  are at risk.  If you do not have HIV, but are at risk, it may be recommended that you take a prescription medicine daily to prevent HIV infection. This is called pre-exposure prophylaxis (PrEP). You are considered at risk if: ? You are sexually active and do not regularly use condoms or know the HIV status of your partner(s). ? You take drugs by injection. ? You are sexually active  with a partner who has HIV.  Talk with your health care provider about whether you are at high risk of being infected with HIV. If you choose to begin PrEP, you should first be tested for HIV. You should then be tested every 3 months for as long as you are taking PrEP. Pregnancy  If you are premenopausal and you may become pregnant, ask your health care provider about preconception counseling.  If you may become pregnant, take 400 to 800 micrograms (mcg) of folic acid every day.  If you want to prevent pregnancy, talk to your health care provider about birth control (contraception). Osteoporosis and menopause  Osteoporosis is a disease in which the bones lose minerals and strength with aging. This can result in serious bone fractures. Your risk for osteoporosis can be identified using a bone density scan.  If you are 84 years of age or older, or if you are at risk for osteoporosis and fractures, ask your health care provider if you should be screened.  Ask your health care provider whether you should take a calcium or vitamin D supplement to lower your risk for osteoporosis.  Menopause may have certain physical symptoms and risks.  Hormone replacement therapy may reduce some of these symptoms and risks. Talk to your health care provider about whether hormone replacement therapy is right for you. Follow these instructions at home:  Schedule regular health, dental, and eye exams.  Stay current with your immunizations.  Do not use any tobacco products including cigarettes, chewing tobacco, or electronic  cigarettes.  If you are pregnant, do not drink alcohol.  If you are breastfeeding, limit how much and how often you drink alcohol.  Limit alcohol intake to no more than 1 drink per day for nonpregnant women. One drink equals 12 ounces of beer, 5 ounces of wine, or 1 ounces of hard liquor.  Do not use street drugs.  Do not share needles.  Ask your health care provider for help if you need support or information about quitting drugs.  Tell your health care provider if you often feel depressed.  Tell your health care provider if you have ever been abused or do not feel safe at home. This information is not intended to replace advice given to you by your health care provider. Make sure you discuss any questions you have with your health care provider. Document Released: 10/04/2010 Document Revised: 08/27/2015 Document Reviewed: 12/23/2014 Elsevier Interactive Patient Education  2018 [employer].   IF you received an x-ray today, you will receive an invoice from [facility]. Please contact [facility] at [phone] with questions or concerns regarding your invoice.   IF you received labwork today, you will receive an invoice from [place]. Please contact [facility] at [phone] with questions or concerns regarding your invoice.   Our billing staff will not be able to assist you with questions regarding bills from these companies.  You will be contacted with the lab results as soon as they are available. The fastest way to get your results is to activate your My Chart account. Instructions are located on the last page of this paperwork. If you have not heard from [place] regarding the results in 2 weeks, please contact this office.

## 2017-02-01 NOTE — Progress Notes (Signed)
[name] [name]  MRN: [id number] DOB: [birth year]  PCP: Patient, No Pcp Per  Subjective:  Pt is a 41 year old female who presents to clinic for PAP.  Last PAP was maybe three years ago.   She was here 9/24 for abnormal periods: She had 2 periods/month last month.Endorses a few episodes of fatigue, lightheadedness. She would like her iron checked.  Endorses some hot flashes, only at night. No hair or skin changes. She has "really bad" chocolate cravings recently.  Her mother went through menopause in her early 67's. Mother also had thyroid disease.  Today she states she had one period this month. Denies dizziness, lightheadedness, syncope.   Review of Systems  Constitutional: Negative for diaphoresis and fatigue.  Gastrointestinal: Negative for abdominal pain.  Genitourinary: Negative for dysuria, flank pain, frequency, hematuria, menstrual problem, vaginal bleeding, vaginal discharge and vaginal pain.  Neurological: Negative for dizziness, light-headedness and headaches.    Patient Active Problem List   Diagnosis Date Noted  . 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 Gallstones 10/15/2013  . Morbid obesity ([place]) 09/21/2012  . Asthma 05/14/2011    Current Outpatient Prescriptions on File Prior to Visit  Medication Sig Dispense Refill  . albuterol (PROVENTIL HFA;VENTOLIN HFA) 108 (90 BASE) MCG/ACT inhaler Inhale 1 puff into the lungs every 6 (six) hours as needed for wheezing or shortness of breath. 1 Inhaler 5  . clobetasol (TEMOVATE) 0.05 % external solution Apply 1 application topically daily as needed (Itching). To scalp    . Antiseborrheic Products, Misc. (PROMISEB) CREA Apply 1 application topically every morning.    . mometasone (ASMANEX) 220 MCG/INH inhaler Inhale 1 puff into the lungs daily. Rinse mouth with water after using. (Patient not taking: Reported on 06/08/2016) 1 Inhaler 11   Current Facility-Administered Medications on File Prior to Visit   Medication Dose Route Frequency Provider Last Rate Last Dose  . albuterol (PROVENTIL) (2.5 MG/3ML) 0.083% nebulizer solution 2.5 mg  2.5 mg Nebulization Once Le, Thao P, DO        Allergies  Allergen Reactions  . Other Other (See Comments)    Nut allergy rash, tongue swells  . Amoxicillin Rash  . Codeine Nausea And Vomiting and Rash  . Tramadol Nausea And Vomiting and Rash     Objective:  BP 112/81   Pulse 75   Temp 97.8 F (36.6 C) (Oral)   Resp 16   Ht 5' (1.524 m)   Wt 216 lb 3.2 oz (98.1 kg)   LMP 01/23/2017   SpO2 100%   BMI 42.22 kg/m   Physical Exam  Constitutional: She is oriented to person, place, and time and well-developed, well-nourished, and in no distress. No distress.  Cardiovascular: Normal rate, regular rhythm and normal heart sounds.   Genitourinary: Vagina normal, uterus normal, cervix normal, right adnexa normal, left adnexa normal and vulva normal. No vaginal discharge found.  Neurological: She is alert and oriented to person, place, and time. GCS score is 15.  Skin: Skin is warm and dry.  Psychiatric: Mood, memory, affect and judgment normal.  Vitals reviewed.   Assessment and Plan :  1. Encounter for gynecological examination without abnormal finding 3. Screening for cervical cancer - Pap IG and HPV (high risk) DNA detection - Pt had one month of 2 periods/month last month. Normal this month. PAP is pending. Will contact with results. Will not repeat anemia panel as she is asymptomatic.    Mercer Pod, PA-C  Primary Care  at [place] 02/01/2017 3:18 PM

## 2017-02-03 ENCOUNTER — Encounter: Payer: Self-pay | Admitting: Physician Assistant

## 2017-02-03 LAB — PAP IG AND HPV HIGH-RISK
HPV, high-risk: NEGATIVE
PAP Smear Comment: 0

## 2017-02-06 NOTE — Progress Notes (Signed)
Informed of results.  

## 2017-03-01 ENCOUNTER — Encounter: Payer: Self-pay | Admitting: Physician Assistant

## 2017-03-01 ENCOUNTER — Other Ambulatory Visit: Payer: Self-pay

## 2017-03-01 ENCOUNTER — Ambulatory Visit: Payer: BLUE CROSS/BLUE SHIELD | Admitting: Physician Assistant

## 2017-03-01 VITALS — BP 112/82 | HR 100 | Temp 98.2°F | Resp 16 | Ht 60.0 in | Wt 217.4 lb

## 2017-03-01 DIAGNOSIS — R110 Nausea: Secondary | ICD-10-CM | POA: Diagnosis not present

## 2017-03-01 DIAGNOSIS — Z87898 Personal history of other specified conditions: Secondary | ICD-10-CM

## 2017-03-01 MED ORDER — ONDANSETRON 8 MG PO TBDP: 8.0000 mg | ORAL_TABLET | Freq: Three times a day (TID) | ORAL | 0 refills | Status: DC | PRN

## 2017-03-01 MED ORDER — MECLIZINE HCL 25 MG PO TABS: 25.0000 mg | ORAL_TABLET | Freq: Three times a day (TID) | ORAL | 0 refills | Status: DC | PRN

## 2017-03-01 NOTE — Patient Instructions (Addendum)
  Antivert for Motion sickness: Take 25 to 50 mg 1 hour before travel, repeat dose every 24 hours if needed Zofran for nausea.   Buy motion sickness "pressure point" bracelets. Start wearing them 12-24 hours prior to the boat ride. Do this in addition to meds.   Thank you for coming in today. I hope you feel we met your needs.  Feel free to call PCP if you have any questions or further requests.  Please consider signing up for MyChart if you do not already have it, as this is a great way to communicate with me.  Best,  Whitney McVey, PA-C   IF you received an x-ray today, you will receive an invoice from [facility]. Please contact [facility] at [phone] with questions or concerns regarding your invoice.   IF you received labwork today, you will receive an invoice from [place]. Please contact [facility] at [phone] with questions or concerns regarding your invoice.   Our billing staff will not be able to assist you with questions regarding bills from these companies.  You will be contacted with the lab results as soon as they are available. The fastest way to get your results is to activate your My Chart account. Instructions are located on the last page of this paperwork. If you have not heard from [place] regarding the results in 2 weeks, please contact this office.

## 2017-03-01 NOTE — Progress Notes (Signed)
   [name] [name]  MRN: [id number] DOB: Jul 06, 1975  PCP: Patient, No Pcp Per  Subjective:  Pt is a pleasant 41 year old female who presents to clinic for prevention of motion sickness.  She is going on a cruise next week and would like medications for motion sickness. She went on a 5 day cruise last year and "was laid out" for the first 2 days due to motion sickness. She has tried Dramamine - doesn't help. She is headed to [place] and [place].   Review of Systems  Gastrointestinal: Positive for nausea and vomiting. Negative for abdominal pain.  Neurological: Positive for dizziness. Negative for numbness and headaches.    Patient Active Problem List   Diagnosis Date Noted  . 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 Gallstones 10/15/2013  . Morbid obesity ([name] [name]) 09/21/2012  . Asthma 05/14/2011    Current Outpatient Medications on File Prior to Visit  Medication Sig Dispense Refill  . albuterol (PROVENTIL HFA;VENTOLIN HFA) 108 (90 BASE) MCG/ACT inhaler Inhale 1 puff into the lungs every 6 (six) hours as needed for wheezing or shortness of breath. 1 Inhaler 5  . Antiseborrheic Products, Misc. (PROMISEB) CREA Apply 1 application topically every morning.    . clobetasol (TEMOVATE) 0.05 % external solution Apply 1 application topically daily as needed (Itching). To scalp    . mometasone (ASMANEX) 220 MCG/INH inhaler Inhale 1 puff into the lungs daily. Rinse mouth with water after using. (Patient not taking: Reported on 06/08/2016) 1 Inhaler 11   Current Facility-Administered Medications on File Prior to Visit  Medication Dose Route Frequency Provider Last Rate Last Dose  . albuterol (PROVENTIL) (2.5 MG/3ML) 0.083% nebulizer solution 2.5 mg  2.5 mg Nebulization Once Le, Thao P, DO        Allergies  Allergen Reactions  . Other Other (See Comments)    Nut allergy rash, tongue swells  . Amoxicillin Rash  . Codeine Nausea And Vomiting and Rash  . Tramadol Nausea And  Vomiting and Rash     Objective:  BP 112/82   Pulse 100   Temp 98.2 F (36.8 C) (Oral)   Resp 16   Ht 5' (1.524 m)   Wt 217 lb 6.4 oz (98.6 kg)   SpO2 100%   BMI 42.46 kg/m   Physical Exam  Constitutional: She is oriented to person, place, and time and well-developed, well-nourished, and in no distress. No distress.  Neurological: She is alert and oriented to person, place, and time. GCS score is 15.  Skin: Skin is warm and dry.  Psychiatric: Mood, memory, affect and judgment normal.  Vitals reviewed.   Assessment and Plan :  1. H/O motion sickness - meclizine (ANTIVERT) 25 MG tablet; Take 1 tablet (25 mg total) by mouth 3 (three) times daily as needed for dizziness.  Dispense: 30 tablet; Refill: 0 - Pt presents for medications to help prevent motion sickness for an upcoming cruise. Advised pressure point bracelets and ginger. She understands and agrees with plan.  2. Nausea without vomiting - ondansetron (ZOFRAN-ODT) 8 MG disintegrating tablet; Take 1 tablet (8 mg total) by mouth every 8 (eight) hours as needed for nausea.  Dispense: 30 tablet; Refill: 0   Mercer Pod, PA-C  Primary Care at [place] 03/01/2017 4:38 PM

## 2017-03-02 IMAGING — MG MM SCREEN MAMMOGRAM BILATERAL
4 series · 4 of 4 positions shown · non-contrast
Comparison: None.

ACR Breast Density Category a: The breast tissue is almost entirely
fatty.

CLINICAL DATA: Screening. Baseline examination.

EXAM:
DIGITAL SCREENING BILATERAL MAMMOGRAM WITH CAD

[R CC]
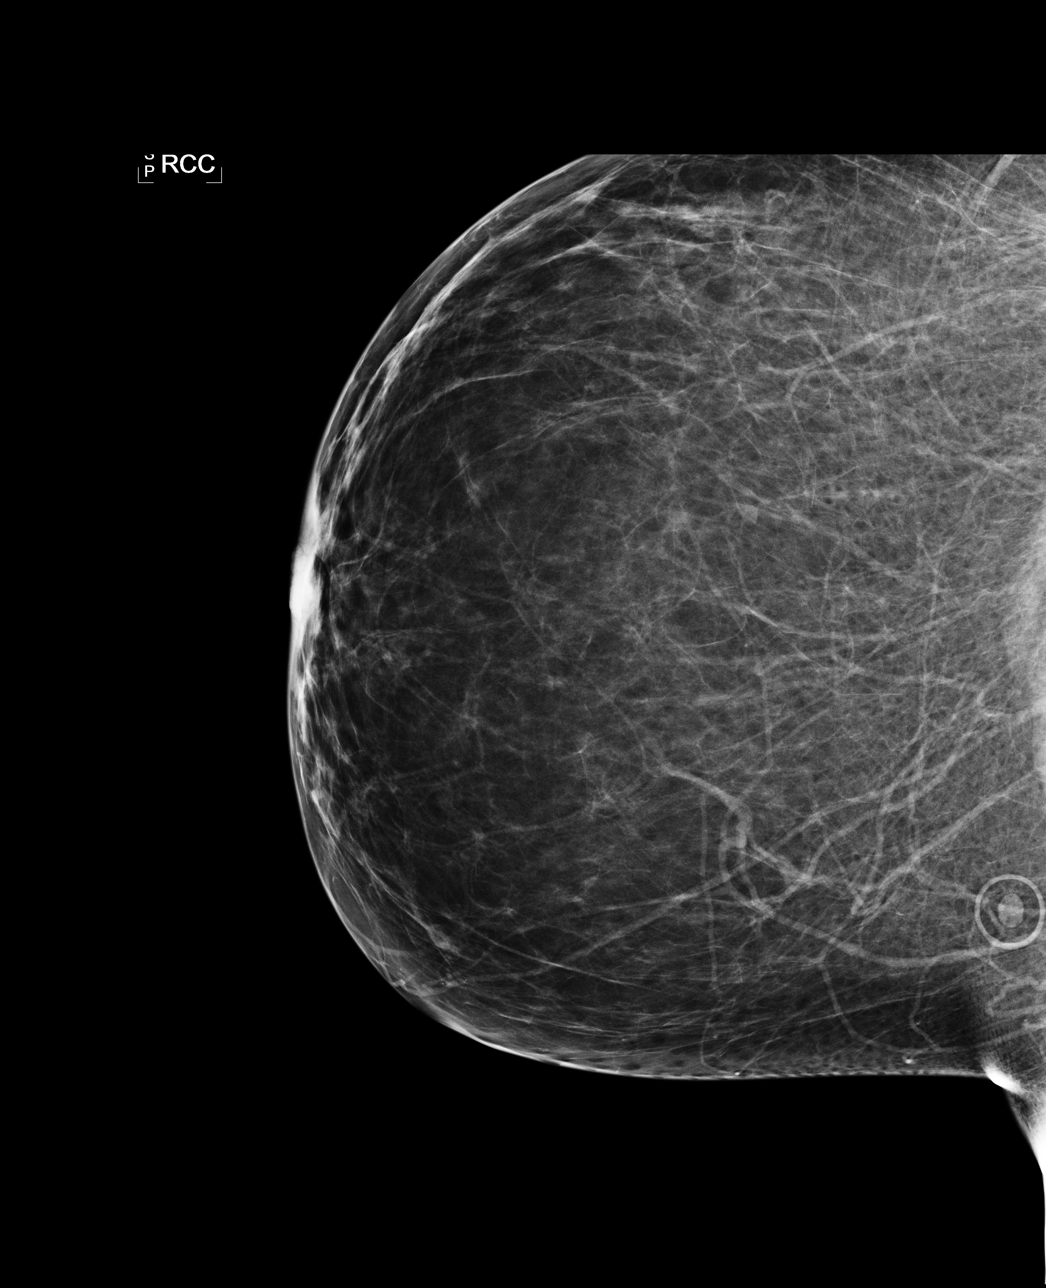

[L CC]
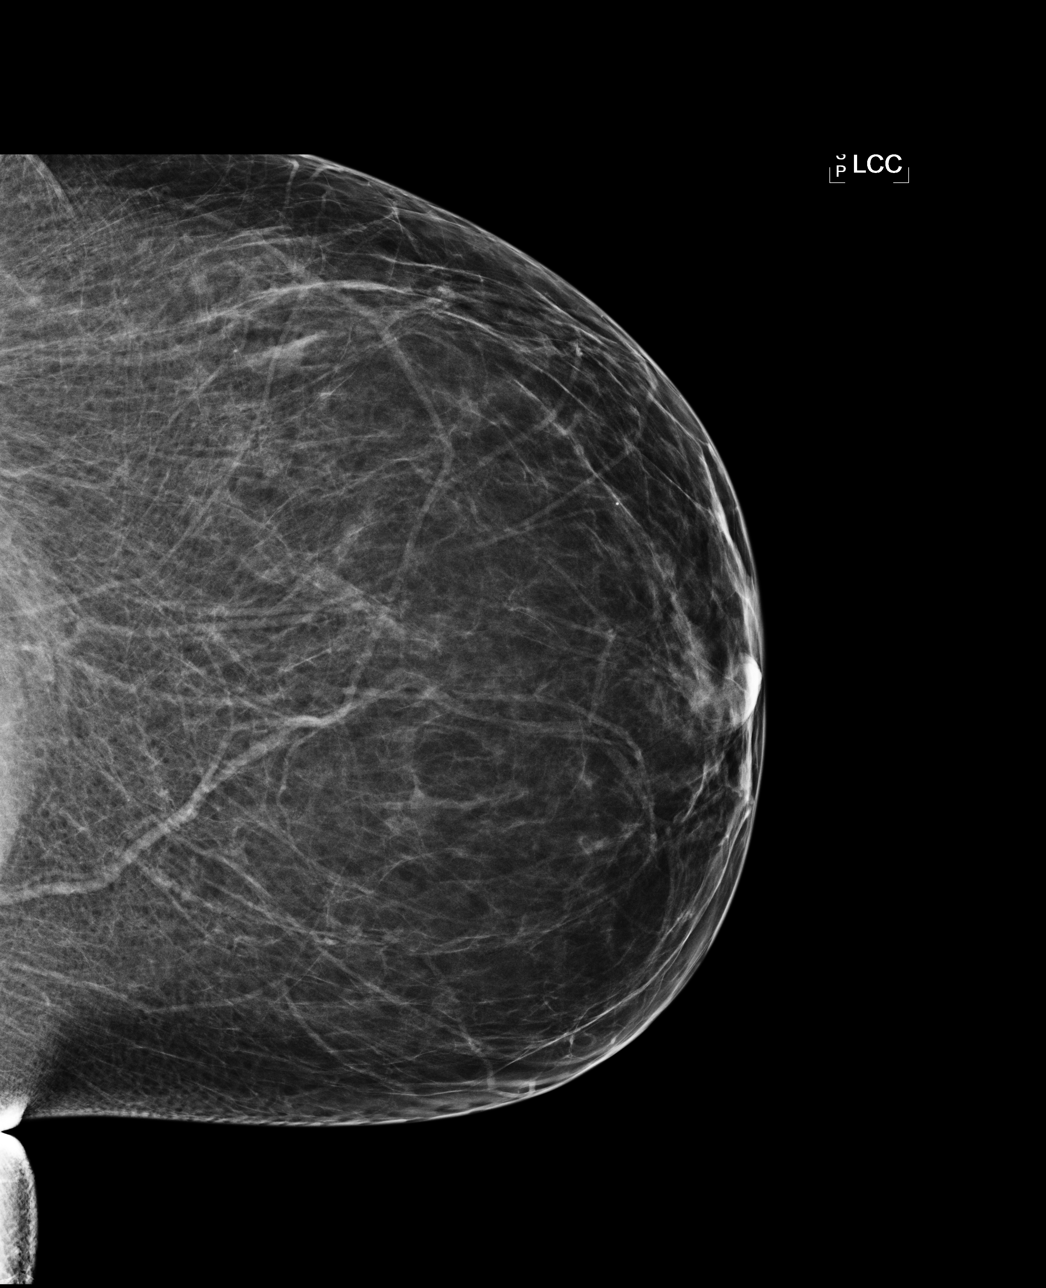

[L MLO]
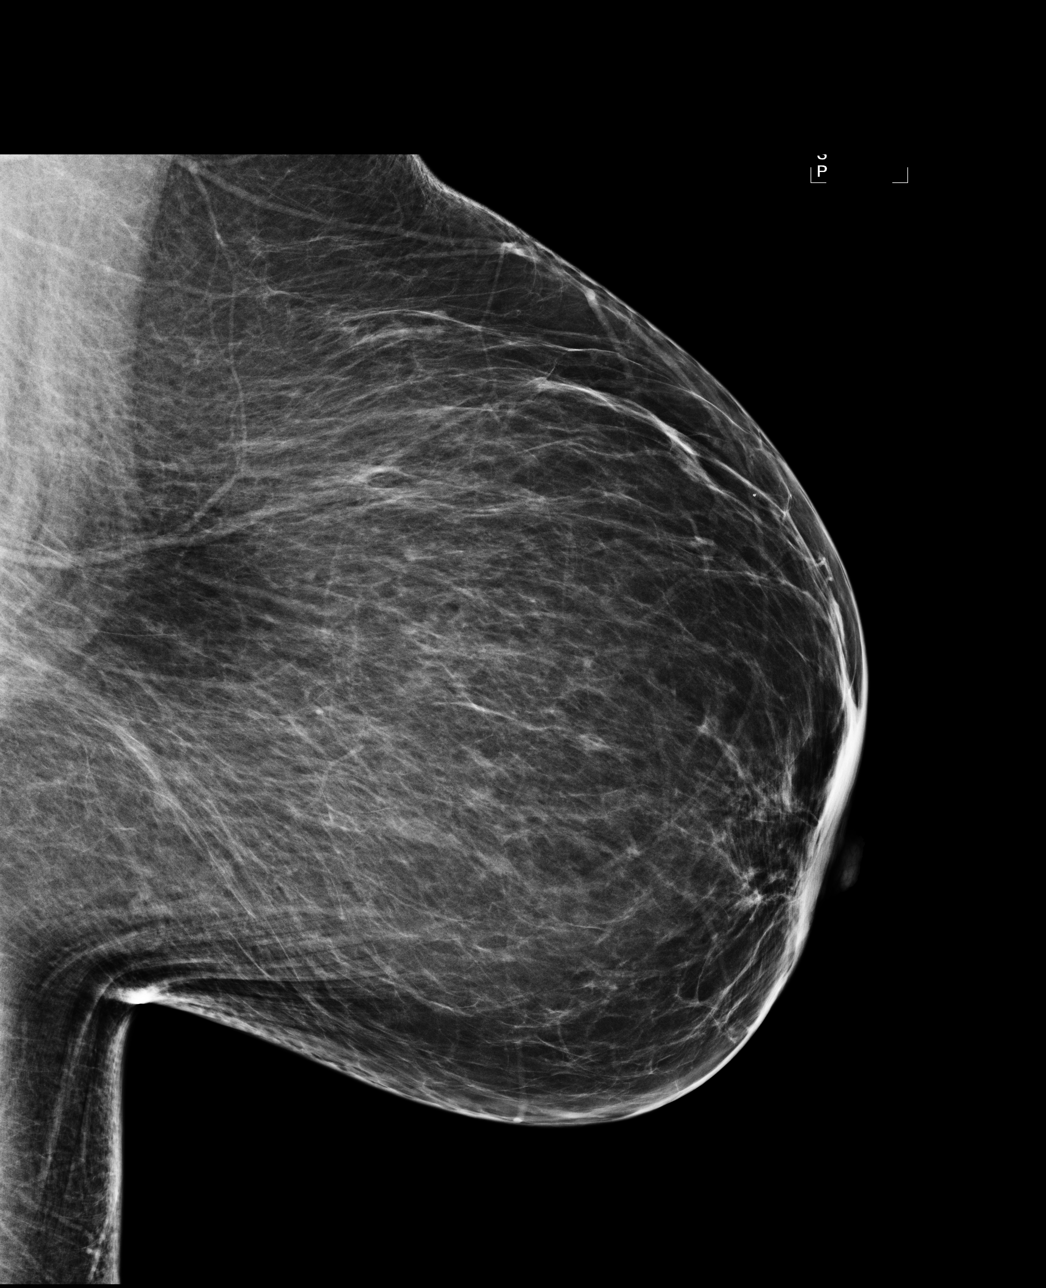

[R MLO]
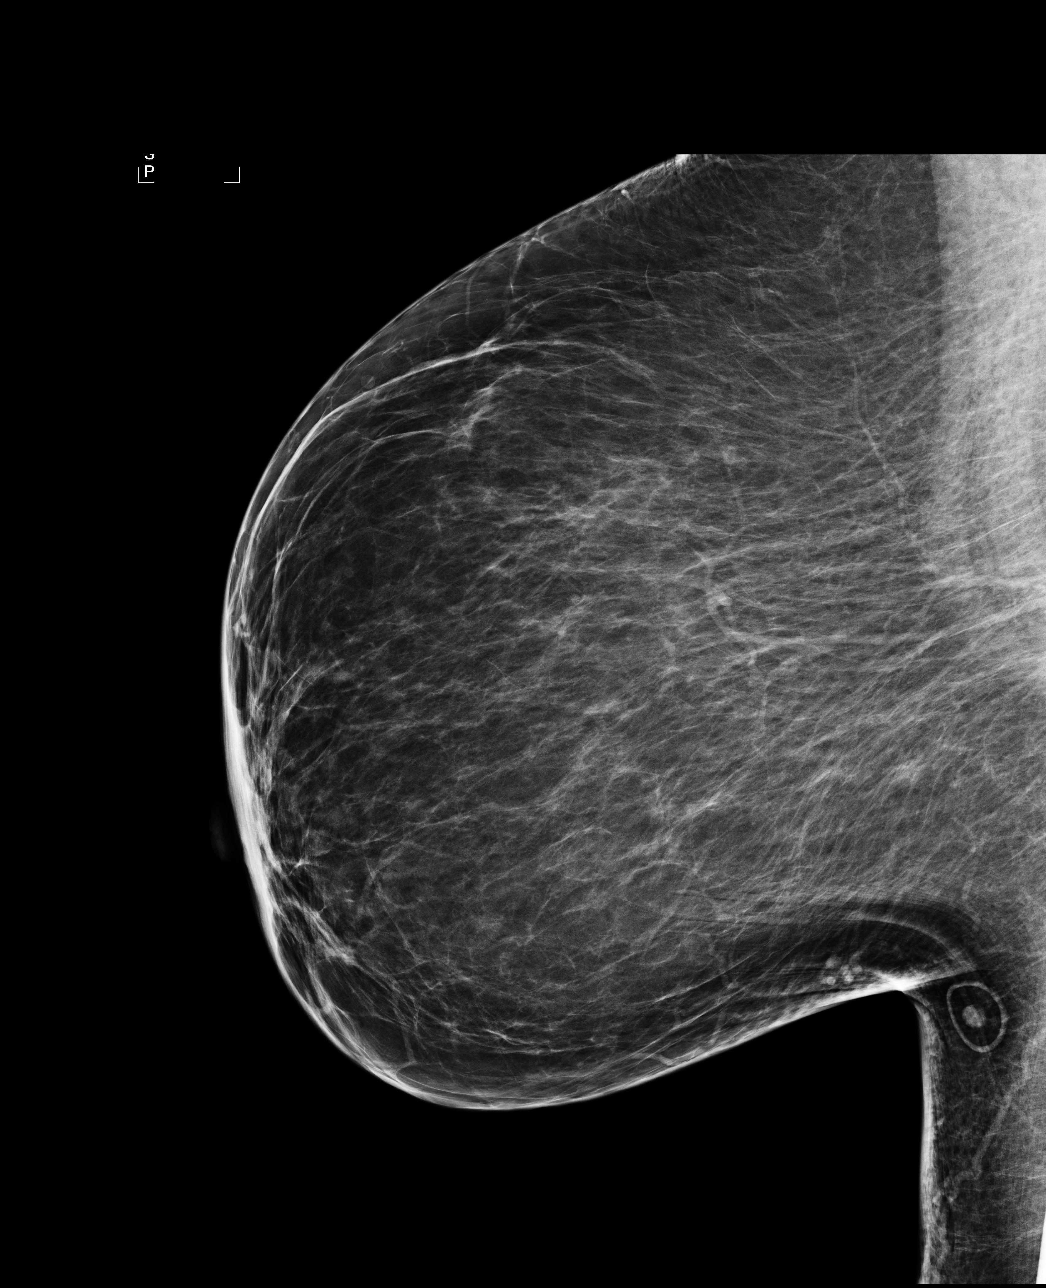

[4 of 4 positions shown; findings below may reference images not displayed]

FINDINGS: There are no findings suspicious for malignancy. Images were
processed with CAD.
IMPRESSION: No mammographic evidence of malignancy. A result letter of this
screening mammogram will be mailed directly to the patient.

RECOMMENDATION:
Screening mammogram in one year. (Code:[id number]-[id number])

BI-RADS CATEGORY  1: Negative.

## 2017-04-25 ENCOUNTER — Ambulatory Visit (INDEPENDENT_AMBULATORY_CARE_PROVIDER_SITE_OTHER): Payer: Commercial Managed Care - PPO | Admitting: Physician Assistant

## 2017-04-25 ENCOUNTER — Encounter: Payer: Self-pay | Admitting: Physician Assistant

## 2017-04-25 VITALS — BP 114/78 | HR 82 | Temp 98.2°F | Resp 16 | Ht 59.5 in | Wt 219.2 lb

## 2017-04-25 DIAGNOSIS — L299 Pruritus, unspecified: Secondary | ICD-10-CM | POA: Diagnosis not present

## 2017-04-25 DIAGNOSIS — F411 Generalized anxiety disorder: Secondary | ICD-10-CM | POA: Diagnosis not present

## 2017-04-25 MED ORDER — HYDROXYZINE HCL 25 MG PO TABS: 25.0000 mg | ORAL_TABLET | Freq: Four times a day (QID) | ORAL | 1 refills | Status: DC | PRN

## 2017-04-25 NOTE — Patient Instructions (Addendum)
Hydroxyzine is an antihistamine that can treat itching and anxiety.  Start by taking 73m every 6-8 hours. If you do not feel improvement after 5-7 days, increase dose to 536mevery 6-8 hours.  If you do not feel improvement after 5-7 days, you may increase dose to 759mvery 6-8 hours. Repeat. Max dose 100m10mery 6-8 hours.   Come back in 3-4 weeks if you do not feel improvement. We can try a different medication.   Work on stress relieving techniques, including improved nutrition.   RELAXATION Consider practicing mindfulness meditation or other relaxation techniques such as deep breathing, prayer, yoga, tai chi, massage. See website mindful.org or the apps Headspace or Calm to help get started.   NUTRITION Eat more plants: colorful vegetables, nuts, seeds and berries. Eat less sugar, salt, preservatives and processed foods. Avoid toxins such as cigarettes and alcohol. Drink water when you are thirsty. Warm water with a slice of lemon is an excellent morning drink to start the day. Consider these websites for more information: The Nutrition Source (httphttps://www.henry-hernandez.biz/ecision Nutrition (www.WindowBlog.chSLEEP Try to get at least 7-8+ hours sleep per day. Regular exercise and reduced caffeine will help you sleep better. Practice good sleep hygeine techniques. See website sleep.org for more information.  Thank you for coming in today. I hope you feel we met your needs.  Feel free to call PCP if you have any questions or further requests.  Please consider signing up for MyChart if you do not already have it, as this is a great way to communicate with me.  Best,  Whitney McVey, PA-C   IF you received an x-ray today, you will receive an invoice from [facility]. Please contact [facility] at [phone] questions or concerns regarding your invoice.   IF you received labwork today, you will receive  an invoice from [facility] contact [facility] at [phone] questions or concerns regarding your invoice.   Our billing staff will not be able to assist you with questions regarding bills from these companies.  You will be contacted with the lab results as soon as they are available. The fastest way to get your results is to activate your My Chart account. Instructions are located on the last page of this paperwork. If you have not heard from us r[place] the results in 2 weeks, please contact this office.

## 2017-04-25 NOTE — Progress Notes (Signed)
[name] [name]  MRN: [id number] DOB: Oct 17, 1975  PCP: Patient, No Pcp Per  Subjective:  Pt is a 42 year old female PMH asthma, morbid obesity who presents to clinic for rash x 3 weeks. Endorses itchiness of b/l shoulders and under left arm x 1 week.  Symptoms worsen as her anxiety worsens. Her job is very stressful at this time.  She has been taking benadryl - not helping.   Review of Systems  Constitutional: Negative for chills, diaphoresis, fatigue and fever.  Skin: Positive for rash.  Neurological: Negative for weakness and numbness.    Patient Active Problem List   Diagnosis Date Noted  . 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 Gallstones 10/15/2013  . Morbid obesity ([place]) 09/21/2012  . Asthma 05/14/2011    Current Outpatient Medications on File Prior to Visit  Medication Sig Dispense Refill  . albuterol (PROVENTIL HFA;VENTOLIN HFA) 108 (90 BASE) MCG/ACT inhaler Inhale 1 puff into the lungs every 6 (six) hours as needed for wheezing or shortness of breath. 1 Inhaler 5  . Antiseborrheic Products, Misc. (PROMISEB) CREA Apply 1 application topically every morning.    . clobetasol (TEMOVATE) 0.05 % external solution Apply 1 application topically daily as needed (Itching). To scalp    . meclizine (ANTIVERT) 25 MG tablet Take 1 tablet (25 mg total) by mouth 3 (three) times daily as needed for dizziness. (Patient not taking: Reported on 04/25/2017) 30 tablet 0  . mometasone (ASMANEX) 220 MCG/INH inhaler Inhale 1 puff into the lungs daily. Rinse mouth with water after using. (Patient not taking: Reported on 06/08/2016) 1 Inhaler 11  . ondansetron (ZOFRAN-ODT) 8 MG disintegrating tablet Take 1 tablet (8 mg total) by mouth every 8 (eight) hours as needed for nausea. (Patient not taking: Reported on 04/25/2017) 30 tablet 0   Current Facility-Administered Medications on File Prior to Visit  Medication Dose Route Frequency Provider Last Rate Last Dose  . albuterol  (PROVENTIL) (2.5 MG/3ML) 0.083% nebulizer solution 2.5 mg  2.5 mg Nebulization Once Le, Thao P, DO        Allergies  Allergen Reactions  . Other Other (See Comments)    Nut allergy rash, tongue swells  . Oxycodone   . Amoxicillin Rash  . Codeine Nausea And Vomiting and Rash  . Tramadol Nausea And Vomiting and Rash     Objective:  BP 114/78 (BP Location: Right Arm, Patient Position: Sitting, Cuff Size: Large)   Pulse 82   Temp 98.2 F (36.8 C) (Oral)   Resp 16   Ht 4' 11.5" (1.511 m)   Wt 219 lb 3.2 oz (99.4 kg)   SpO2 98%   BMI 43.53 kg/m   Physical Exam  Constitutional: She is oriented to person, place, and time and well-developed, well-nourished, and in no distress. No distress.  Neurological: She is alert and oriented to person, place, and time. She has normal sensation. No sensory deficit. GCS score is 15.  Skin: Skin is warm and dry. No rash noted. She is not diaphoretic.  Psychiatric: Mood, memory, affect and judgment normal.    Assessment and Plan :  1. Anxiety state 2. Itching - hydrOXYzine (ATARAX/VISTARIL) 25 MG tablet; Take 1 tablet (25 mg total) by mouth every 6 (six) hours as needed. May increase dose by 25mg  every 7 days as needed. Max dose 100mg   Dispense: 90 tablet; Refill: 1 - Pt c/o itching which worsens with anxiety, which seems to be job related. No rash noted on PE.  Plan to treat with Hydroxyzine. RTC in 3-4 weeks if no relief. Consider anti-anxiety medication.  Mercer Pod, PA-C  [facility] 04/25/2017 2:25 PM

## 2017-04-29 ENCOUNTER — Ambulatory Visit (INDEPENDENT_AMBULATORY_CARE_PROVIDER_SITE_OTHER): Payer: Commercial Managed Care - PPO | Admitting: Physician Assistant

## 2017-04-29 ENCOUNTER — Encounter: Payer: Self-pay | Admitting: Physician Assistant

## 2017-04-29 VITALS — BP 120/80 | HR 96 | Temp 98.2°F | Resp 18 | Ht 59.5 in | Wt 220.0 lb

## 2017-04-29 DIAGNOSIS — R062 Wheezing: Secondary | ICD-10-CM

## 2017-04-29 DIAGNOSIS — J4520 Mild intermittent asthma, uncomplicated: Secondary | ICD-10-CM

## 2017-04-29 DIAGNOSIS — R05 Cough: Secondary | ICD-10-CM

## 2017-04-29 DIAGNOSIS — R059 Cough, unspecified: Secondary | ICD-10-CM

## 2017-04-29 MED ORDER — BENZONATATE 100 MG PO CAPS: 100.0000 mg | ORAL_CAPSULE | Freq: Three times a day (TID) | ORAL | 0 refills | Status: DC | PRN

## 2017-04-29 MED ORDER — PREDNISONE 20 MG PO TABS: 40.0000 mg | ORAL_TABLET | Freq: Every day | ORAL | 0 refills | Status: AC

## 2017-04-29 MED ORDER — ALBUTEROL SULFATE (2.5 MG/3ML) 0.083% IN NEBU: 2.5000 mg | INHALATION_SOLUTION | Freq: Four times a day (QID) | RESPIRATORY_TRACT | 1 refills | Status: AC | PRN

## 2017-04-29 NOTE — Progress Notes (Signed)
MRN: [id number] DOB: November 02, 1975  Subjective:   [name] [name] is a 42 y.o. female presenting for chief complaint of Asthma and Cough . Reports 2 day history of dry cough (no hemoptysis), wheezing and chest tightness.  Has tried breathing tx and tessalon perles, which did provide relief. Denies fever, sinus pain, red eyes, ear pain, sore throat and myalgia, chills, fatigue, malaise, nausea, vomiting, abdominal pain and diarrhea. Has not had sick contact with anyone. Has history of seasonal allergies, has history of asthma, used to have maintenance inhaler but now is well controlled on albuterol inhaler prn. Patient has had flu shot this season. Denies smoking. Cannot stay today for breathing tx. She is leaving today to go to [place] for a work trip and notes that it is supposed to be colder and windier there. Notes she typical has flare ups of asthma with changes in weather. Denies any other aggravating or relieving factors, no other questions or concerns.  [name] [name] has a current medication list which includes the following prescription(s): albuterol, promiseb, clobetasol, hydroxyzine, meclizine, mometasone, and ondansetron, and the following Facility-Administered Medications: albuterol. Also is allergic to other; oxycodone; amoxicillin; codeine; and tramadol.  [name] [name]  has a past medical history of Allergy, Arthritis, Asthma, Headache(784.0), Obesity, PONV (postoperative nausea and vomiting), and Sleep apnea (08-16-13 ). Also  has a past surgical history that includes Knee arthroscopy (Left, 9-10 yrs ago); Foot neuroma surgery (Right, 2013); Tubal ligation (2004); Laparoscopic gastric sleeve resection ([name], 10/29/2013); Upper gi endoscopy (10/29/2013); Shoulder surgery; and Toe Surgery (03/22/2018).   Objective:   Vitals: BP 120/80   Pulse 96   Temp 98.2 F (36.8 C) (Oral)   Resp 18   Ht 4' 11.5" (1.511 m)   Wt 220 lb (99.8 kg)   SpO2 96%   BMI 43.69 kg/m   Physical Exam    Constitutional: She is oriented to person, place, and time. She appears well-developed and well-nourished. No distress.  HENT:  Head: Normocephalic and atraumatic.  Right Ear: Tympanic membrane, external ear and ear canal normal.  Left Ear: Tympanic membrane, external ear and ear canal normal.  Nose: Nose normal. No mucosal edema. Right sinus exhibits no maxillary sinus tenderness and no frontal sinus tenderness. Left sinus exhibits no maxillary sinus tenderness and no frontal sinus tenderness.  Mouth/Throat: Uvula is midline, oropharynx is clear and moist and mucous membranes are normal. No tonsillar exudate.  Eyes: Conjunctivae are normal.  Neck: Normal range of motion.  Cardiovascular: Normal rate, regular rhythm and normal heart sounds.  Pulmonary/Chest: Effort normal. She has wheezes (few wheezes auscultated with forced expiratory breahing in posterior lung fields bilaterally). She has no rhonchi. She has no rales.  Lymphadenopathy:       Head (right side): No submental, no submandibular, no tonsillar, no preauricular, no posterior auricular and no occipital adenopathy present.       Head (left side): No submental, no submandibular, no tonsillar, no preauricular, no posterior auricular and no occipital adenopathy present.    She has no cervical adenopathy.       Right: No supraclavicular adenopathy present.       Left: No supraclavicular adenopathy present.  Neurological: She is alert and oriented to person, place, and time.  Skin: Skin is warm and dry.  Psychiatric: She has a normal mood and affect.  Vitals reviewed.   No results found for this or any previous visit (from the past 24 hour(s)).   Peak flow reading is 375, about 100 %  of predicted.  Assessment and Plan :  1. Cough - Care order/instruction: - benzonatate (TESSALON) 100 MG capsule; Take 1-2 capsules (100-200 mg total) by mouth 3 (three) times daily as needed for cough.  Dispense: 40 capsule; Refill: 0 2.  Wheezing - predniSONE (DELTASONE) 20 MG tablet; Take 2 tablets (40 mg total) by mouth daily with breakfast for 5 days.  Dispense: 10 tablet; Refill: 0 - albuterol (PROVENTIL) (2.5 MG/3ML) 0.083% nebulizer solution; Take 3 mLs (2.5 mg total) by nebulization every 6 (six) hours as needed for wheezing or shortness of breath.  Dispense: 150 mL; Refill: 1 3. Mild intermittent asthma, unspecified whether complicated She is overall well appearing, no distress. Vitals stable. Few wheezes noted on exam with forced expiratory breathing. Her peak flow is normal. She does not have time to stay for a breathing treatment but does have nebulizer machine at home. Given refills for neb solution. Recommend short course oral prednisone at this time. Advised to return to clinic if symptoms worsen, do not improve, or as needed.  Tenna Delaine, PA-C  [facility] 04/29/2017 11:13 AM

## 2017-04-29 NOTE — Patient Instructions (Addendum)
We are going to treat your underlying inflammation with oral prednisone. Prednisone is a steroid and can cause side effects such as headache, irritability, nausea, vomiting, increased heart rate, increased blood pressure, increased blood sugar, appetite changes, and insomnia. Please take tablets in the morning with a full meal to help decrease the chances of these side effects.   You may do a breathing treatment or use albuterol inhaler every 4-6 hours as needed. If any symptoms worsen or do not improve with treatment, please return to clinic.    Cough, Adult A cough helps to clear your throat and lungs. A cough may last only 2-3 weeks (acute), or it may last longer than 8 weeks (chronic). Many different things can cause a cough. A cough may be a sign of an illness or another medical condition. Follow these instructions at home:  Pay attention to any changes in your cough.  Take medicines only as told by your doctor. ? If you were prescribed an antibiotic medicine, take it as told by your doctor. Do not stop taking it even if you start to feel better. ? Talk with your doctor before you try using a cough medicine.  Drink enough fluid to keep your pee (urine) clear or pale yellow.  If the air is dry, use a cold steam vaporizer or humidifier in your home.  Stay away from things that make you cough at work or at home.  If your cough is worse at night, try using extra pillows to raise your head up higher while you sleep.  Do not smoke, and try not to be around smoke. If you need help quitting, ask your doctor.  Do not have caffeine.  Do not drink alcohol.  Rest as needed. Contact a doctor if:  You have new problems (symptoms).  You cough up yellow fluid (pus).  Your cough does not get better after 2-3 weeks, or your cough gets worse.  Medicine does not help your cough and you are not sleeping well.  You have pain that gets worse or pain that is not helped with medicine.  You  have a fever.  You are losing weight and you do not know why.  You have night sweats. Get help right away if:  You cough up blood.  You have trouble breathing.  Your heartbeat is very fast. This information is not intended to replace advice given to you by your health care provider. Make sure you discuss any questions you have with your health care provider. Document Released: 12/02/2010 Document Revised: 08/27/2015 Document Reviewed: 05/28/2014 Elsevier Interactive Patient Education  2018 [employer].     IF you received an x-ray today, you will receive an invoice from [facility]. Please contact [facility] at [phone] with questions or concerns regarding your invoice.   IF you received labwork today, you will receive an invoice from [facility]. Please contact [facility] at [phone] with questions or concerns regarding your invoice.   Our billing staff will not be able to assist you with questions regarding bills from these companies.  You will be contacted with the lab results as soon as they are available. The fastest way to get your results is to activate your My Chart account. Instructions are located on the last page of this paperwork. If you have not heard from [place] regarding the results in 2 weeks, please contact this office.

## 2017-05-17 ENCOUNTER — Other Ambulatory Visit: Payer: Self-pay

## 2017-05-17 ENCOUNTER — Ambulatory Visit (INDEPENDENT_AMBULATORY_CARE_PROVIDER_SITE_OTHER): Payer: Commercial Managed Care - PPO | Admitting: Physician Assistant

## 2017-05-17 ENCOUNTER — Encounter: Payer: Self-pay | Admitting: Physician Assistant

## 2017-05-17 VITALS — BP 104/80 | HR 73 | Temp 98.2°F | Resp 16 | Ht 60.0 in | Wt 227.2 lb

## 2017-05-17 DIAGNOSIS — R300 Dysuria: Secondary | ICD-10-CM

## 2017-05-17 DIAGNOSIS — N3000 Acute cystitis without hematuria: Secondary | ICD-10-CM

## 2017-05-17 LAB — POCT URINALYSIS DIP (MANUAL ENTRY)
Bilirubin, UA: NEGATIVE
Blood, UA: NEGATIVE
Glucose, UA: NEGATIVE mg/dL
Nitrite, UA: NEGATIVE
Protein Ur, POC: NEGATIVE mg/dL
Spec Grav, UA: >=1.03 — AB (ref 1.010–1.025)
Urobilinogen, UA: 0.2 E.U./dL
pH, UA: 5.5 (ref 5.0–8.0)

## 2017-05-17 LAB — POC MICROSCOPIC URINALYSIS (UMFC): Mucus: ABSENT

## 2017-05-17 MED ORDER — PHENAZOPYRIDINE HCL 200 MG PO TABS: 200.0000 mg | ORAL_TABLET | Freq: Three times a day (TID) | ORAL | 0 refills | Status: DC | PRN

## 2017-05-17 MED ORDER — NITROFURANTOIN MONOHYD MACRO 100 MG PO CAPS: 100.0000 mg | ORAL_CAPSULE | Freq: Two times a day (BID) | ORAL | 0 refills | Status: AC

## 2017-05-17 NOTE — Progress Notes (Signed)
[name] [name]  MRN: [id number] DOB: 1975/07/23  PCP: Patient, No Pcp Per  Subjective:  Pt is a 42 year old female who complains of burning with urination and urgency. She has had symptoms for 1 day. Patient also complains of none. Patient denies back pain, fever, sorethroat, stomach ache and vaginal discharge. Patient does not have a history of recurrent UTI. Patient does not have a history of pyelonephritis.  She has not taken anything to feel better. Last UTI was sometime last year.    Review of Systems  Constitutional: Negative for chills, fatigue and fever.  Respiratory: Negative for cough, shortness of breath and wheezing.   Cardiovascular: Negative for chest pain and palpitations.  Gastrointestinal: Negative for abdominal pain, diarrhea, nausea and vomiting.  Genitourinary: Positive for dysuria and frequency. Negative for decreased urine volume, difficulty urinating, enuresis, flank pain, hematuria and urgency.  Musculoskeletal: Negative for back pain.  Neurological: Negative for dizziness, weakness, light-headedness and headaches.    Patient Active Problem List   Diagnosis Date Noted  . 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 Gallstones 10/15/2013  . Morbid obesity ([place]) 09/21/2012  . Asthma 05/14/2011    Current Outpatient Medications on File Prior to Visit  Medication Sig Dispense Refill  . albuterol (PROVENTIL) (2.5 MG/3ML) 0.083% nebulizer solution Take 3 mLs (2.5 mg total) by nebulization every 6 (six) hours as needed for wheezing or shortness of breath. 150 mL 1  . Antiseborrheic Products, Misc. (PROMISEB) CREA Apply 1 application topically every morning.    . benzonatate (TESSALON) 100 MG capsule Take 1-2 capsules (100-200 mg total) by mouth 3 (three) times daily as needed for cough. 40 capsule 0  . Biotin 1000 MCG CHEW Chew by mouth.    . calcium citrate-vitamin D (CITRACAL+D) 315-200 MG-UNIT tablet Take by mouth.    . clobetasol  (TEMOVATE) 0.05 % external solution Apply 1 application topically daily as needed (Itching). To scalp    . Cyanocobalamin (VITAMIN B-12) 1000 MCG SUBL Place under the tongue.    . cyclobenzaprine (FLEXERIL) 10 MG tablet TAKE 1 TABLET (10 MG TOTAL) BY MOUTH TWO (2) TIMES A DAY AS NEEDED FOR MUSCLE SPASMS.  0  . Fluocinonide 0.1 % CREA Apply topically daily as needed.    . hydrOXYzine (ATARAX/VISTARIL) 25 MG tablet Take 1 tablet (25 mg total) by mouth every 6 (six) hours as needed. May increase dose by 25mg  every 7 days as needed. Max dose 100mg  90 tablet 1  . Lidocaine 4 % GEL Apply 1 application topically two (2) times a day as needed.    . Multiple Vitamin (MULTIVITAMIN) capsule Take by mouth.     Current Facility-Administered Medications on File Prior to Visit  Medication Dose Route Frequency Provider Last Rate Last Dose  . albuterol (PROVENTIL) (2.5 MG/3ML) 0.083% nebulizer solution 2.5 mg  2.5 mg Nebulization Once Le, Thao P, DO        Allergies  Allergen Reactions  . Other Other (See Comments)    Nut allergy rash, tongue swells  . Oxycodone   . Amoxicillin Rash  . Codeine Nausea And Vomiting and Rash  . Tramadol Nausea And Vomiting and Rash     Objective:  BP 104/80   Pulse 73   Temp 98.2 F (36.8 C) (Oral)   Resp 16   Ht 5' (1.524 m)   Wt 227 lb 3.2 oz (103.1 kg)   LMP 04/27/2017   SpO2 100%   BMI 44.37 kg/m  Physical Exam  Constitutional: She is oriented to person, place, and time and well-developed, well-nourished, and in no distress. No distress.  Cardiovascular: Normal rate, regular rhythm and normal heart sounds.  Abdominal: There is no tenderness. There is no CVA tenderness.  Neurological: She is alert and oriented to person, place, and time. GCS score is 15.  Skin: Skin is warm and dry.  Psychiatric: Mood, memory, affect and judgment normal.  Vitals reviewed.  Results for orders placed or performed in visit on 05/17/17  POCT urinalysis dipstick  Result  Value Ref Range   Color, UA yellow yellow   Clarity, UA cloudy (A) clear   Glucose, UA negative negative mg/dL   Bilirubin, UA negative negative   Ketones, POC UA trace (5) (A) negative mg/dL   Spec Grav, UA >=1.030 (A) 1.010 - 1.025   Blood, UA negative negative   pH, UA 5.5 5.0 - 8.0   Protein Ur, POC negative negative mg/dL   Urobilinogen, UA 0.2 0.2 or 1.0 E.U./dL   Nitrite, UA Negative Negative   Leukocytes, UA Small (1+) (A) Negative    Assessment and Plan :  1. Acute cystitis without hematuria 2. Dysuria - nitrofurantoin, macrocrystal-monohydrate, (MACROBID) 100 MG capsule; Take 1 capsule (100 mg total) by mouth 2 (two) times daily for 7 days.  Dispense: 14 capsule; Refill: 0 - POCT Microscopic Urinalysis ([facility]) - Urine Culture - POCT urinalysis dipstick - phenazopyridine (PYRIDIUM) 200 MG tablet; Take 1 tablet (200 mg total) by mouth 3 (three) times daily as needed for pain.  Dispense: 10 tablet; Refill: 0 - pt c/o urinary symptoms x 1 day. UA + leukocytes. Plan to cover for uncomplicated cystitis with macrobid. No suspicion of pyelonephritis today. RTC if symptoms worsen.   Mercer Pod, PA-C  [facility] at [place] 05/17/2017 4:14 PM

## 2017-05-17 NOTE — Patient Instructions (Addendum)
  Stay well hydrated - try to drink at least 1-2 liters of water daily. . Avoid caffeine and sugary drinks for the next week.  Come back if your symptoms worsen.   Thank you for coming in today. I hope you feel we met your needs.  Feel free to call PCP if you have any questions or further requests.  Please consider signing up for MyChart if you do not already have it, as this is a great way to communicate with me.  Best,  Whitney McVey, PA-C   Urinary Tract Infection, Adult A urinary tract infection (UTI) is an infection of any part of the urinary tract. The urinary tract includes the:  Kidneys.  Ureters.  Bladder.  Urethra.  These organs make, store, and get rid of pee (urine) in the body. Follow these instructions at home:  Take over-the-counter and prescription medicines only as told by your doctor.  If you were prescribed an antibiotic medicine, take it as told by your doctor. Do not stop taking the antibiotic even if you start to feel better.  Avoid the following drinks: ? Alcohol. ? Caffeine. ? Tea. ? Carbonated drinks.  Drink enough fluid to keep your pee clear or pale yellow.  Keep all follow-up visits as told by your doctor. This is important.  Make sure to: ? Empty your bladder often and completely. Do not to hold pee for long periods of time. ? Empty your bladder before and after sex. ? Wipe from front to back after a bowel movement if you are female. Use each tissue one time when you wipe. Contact a doctor if:  You have back pain.  You have a fever.  You feel sick to your stomach (nauseous).  You throw up (vomit).  Your symptoms do not get better after 3 days.  Your symptoms go away and then come back. Get help right away if:  You have very bad back pain.  You have very bad lower belly (abdominal) pain.  You are throwing up and cannot keep down any medicines or water. This information is not intended to replace advice given to you by your  health care provider. Make sure you discuss any questions you have with your health care provider. Document Released: 09/07/2007 Document Revised: 08/27/2015 Document Reviewed: 02/09/2015 Elsevier Interactive Patient Education  2018 [employer].  IF you received an x-ray today, you will receive an invoice from [facility]. Please contact [facility] at [phone] with questions or concerns regarding your invoice.   IF you received labwork today, you will receive an invoice from [facility]. Please contact [facility] at [phone] with questions or concerns regarding your invoice.   Our billing staff will not be able to assist you with questions regarding bills from these companies.  You will be contacted with the lab results as soon as they are available. The fastest way to get your results is to activate your My Chart account. Instructions are located on the last page of this paperwork. If you have not heard from [place] regarding the results in 2 weeks, please contact this office.

## 2017-05-18 LAB — URINE CULTURE

## 2017-05-18 NOTE — Progress Notes (Signed)
Please let pt know her strep culture is negative. Thank you!

## 2017-05-24 ENCOUNTER — Encounter (HOSPITAL_COMMUNITY): Payer: Self-pay

## 2017-05-26 DIAGNOSIS — Z9884 Bariatric surgery status: Secondary | ICD-10-CM | POA: Diagnosis not present

## 2017-07-20 ENCOUNTER — Other Ambulatory Visit: Payer: Self-pay | Admitting: Neurosurgery

## 2017-07-20 DIAGNOSIS — G950 Syringomyelia and syringobulbia: Secondary | ICD-10-CM

## 2017-07-27 ENCOUNTER — Ambulatory Visit
Admission: RE | Admit: 2017-07-27 | Discharge: 2017-07-27 | Disposition: A | Discharge status: Home / Self Care | Payer: Commercial Managed Care - PPO | Source: Ambulatory Visit | Attending: Neurosurgery | Admitting: Neurosurgery

## 2017-07-27 DIAGNOSIS — G950 Syringomyelia and syringobulbia: Secondary | ICD-10-CM

## 2017-08-04 DIAGNOSIS — G950 Syringomyelia and syringobulbia: Secondary | ICD-10-CM | POA: Diagnosis not present

## 2017-08-04 DIAGNOSIS — M5020 Other cervical disc displacement, unspecified cervical region: Secondary | ICD-10-CM | POA: Diagnosis not present

## 2017-08-17 ENCOUNTER — Ambulatory Visit (INDEPENDENT_AMBULATORY_CARE_PROVIDER_SITE_OTHER): Payer: Commercial Managed Care - PPO

## 2017-08-17 ENCOUNTER — Encounter: Payer: Self-pay | Admitting: Physician Assistant

## 2017-08-17 ENCOUNTER — Other Ambulatory Visit: Payer: Self-pay

## 2017-08-17 ENCOUNTER — Ambulatory Visit (INDEPENDENT_AMBULATORY_CARE_PROVIDER_SITE_OTHER): Payer: Commercial Managed Care - PPO | Admitting: Physician Assistant

## 2017-08-17 VITALS — BP 116/82 | HR 96 | Temp 98.4°F | Resp 16 | Ht 60.63 in | Wt 228.0 lb

## 2017-08-17 DIAGNOSIS — R059 Cough, unspecified: Secondary | ICD-10-CM

## 2017-08-17 DIAGNOSIS — R05 Cough: Secondary | ICD-10-CM | POA: Diagnosis not present

## 2017-08-17 DIAGNOSIS — J45909 Unspecified asthma, uncomplicated: Secondary | ICD-10-CM | POA: Diagnosis not present

## 2017-08-17 DIAGNOSIS — R062 Wheezing: Secondary | ICD-10-CM

## 2017-08-17 MED ORDER — PREDNISONE 20 MG PO TABS: 40.0000 mg | ORAL_TABLET | Freq: Every day | ORAL | 0 refills | Status: AC

## 2017-08-17 MED ORDER — MONTELUKAST SODIUM 10 MG PO TABS: 10.0000 mg | ORAL_TABLET | Freq: Every day | ORAL | 0 refills | Status: DC

## 2017-08-17 NOTE — Patient Instructions (Addendum)
  We are going to treat your underlying inflammation with oral prednisone. Prednisone is a steroid and can cause side effects such as headache, irritability, nausea, vomiting, increased heart rate, increased blood pressure, increased blood sugar, appetite changes, and insomnia. Please take tablets in the morning with a full meal to help decrease the chances of these side effects.   Continue with breathing treatments every 4-6 hours.   Continue with zyrtec and flonase. Start Singulair during seasons when asthma is less controlled.   -Return to clinic if symptoms worsen, do not improve, or as needed   IF you received an x-ray today, you will receive an invoice from [facility]. Please contact [facility] at [phone] with questions or concerns regarding your invoice.   IF you received labwork today, you will receive an invoice from [facility]. Please contact [facility] at [phone] with questions or concerns regarding your invoice.   Our billing staff will not be able to assist you with questions regarding bills from these companies.  You will be contacted with the lab results as soon as they are available. The fastest way to get your results is to activate your My Chart account. Instructions are located on the last page of this paperwork. If you have not heard from [place] regarding the results in 2 weeks, please contact this office.

## 2017-08-17 NOTE — Progress Notes (Signed)
MRN: [id number] DOB: 01-Jul-1975  Subjective:   [name] [name] is a 42 y.o. female presenting for chief complaint of Cough (with some shob x 1 month ) and Asthma (pt states she feels like she having a flair up) .  Reports 1 mth history of on/off worsening productive cough, SOB, and wheezing. Notes it all started with changes in season and high pollen count.  Has tried breathing tx and allergy medication with no full relief. Denies fever, chest pain, sinus pain, red eyes, ear pain, sore throat,  myalgia, chills, fatigue, malaise, nausea, vomiting, abdominal pain and diarrhea. Has not had sick contact with anyone. Has history of seasonal allergies and asthma, used to have maintenance inhaler but now is well controlled on albuterol inhaler prn. Notes the maintenance inhaler does not seem to work for her.  Patient has had flu shot this season. Denies smoking. Notes she typical has flare ups of asthma with changes in weather. Last breathing treatment was this morning. No PMH of GERD.   [name] [name] has a current medication list which includes the following prescription(s): albuterol, promiseb, benzonatate, biotin, calcium citrate-vitamin d, clobetasol, vitamin b-12, cyclobenzaprine, fluocinonide, hydroxyzine, lidocaine, multivitamin, and phenazopyridine, and the following Facility-Administered Medications: albuterol. Also is allergic to other; oxycodone; amoxicillin; codeine; and tramadol.  [name] [name]  has a past medical history of Allergy, Arthritis, Asthma, Headache(784.0), Obesity, PONV (postoperative nausea and vomiting), and Sleep apnea (08-16-13 ). Also  has a past surgical history that includes Knee arthroscopy (Left, 9-10 yrs ago); Foot neuroma surgery (Right, 2013); Tubal ligation (2004); Laparoscopic gastric sleeve resection ([name], 10/29/2013); Upper gi endoscopy (10/29/2013); Shoulder surgery; and Toe Surgery (03/22/2018).   Objective:   Vitals: BP 116/82   Pulse 96   Temp 98.4 F (36.9  C) (Oral)   Resp 16   Ht 5' 0.63" (1.54 m)   Wt 228 lb (103.4 kg)   SpO2 94%   PF 425 L/min   BMI 43.61 kg/m   Physical Exam  Constitutional: She is oriented to person, place, and time. She appears well-developed and well-nourished. No distress.  HENT:  Head: Normocephalic and atraumatic.  Right Ear: Tympanic membrane, external ear and ear canal normal.  Left Ear: Tympanic membrane, external ear and ear canal normal.  Nose: Mucosal edema (moderate bilaterally) present. Right sinus exhibits no maxillary sinus tenderness and no frontal sinus tenderness. Left sinus exhibits no maxillary sinus tenderness and no frontal sinus tenderness.  Mouth/Throat: Uvula is midline, oropharynx is clear and moist and mucous membranes are normal. No posterior oropharyngeal edema or tonsillar abscesses. No tonsillar exudate.  Eyes: Conjunctivae are normal.  Neck: Normal range of motion.  Cardiovascular: Normal rate, regular rhythm, normal heart sounds and intact distal pulses.  Pulmonary/Chest: Effort normal and breath sounds normal. No accessory muscle usage. No tachypnea. No respiratory distress. She has no decreased breath sounds. She has no wheezes. She has no rhonchi. She has no rales.  Lymphadenopathy:       Head (right side): No submental, no submandibular, no tonsillar, no preauricular, no posterior auricular and no occipital adenopathy present.       Head (left side): No submental, no submandibular, no tonsillar, no preauricular, no posterior auricular and no occipital adenopathy present.    She has no cervical adenopathy.       Right: No supraclavicular adenopathy present.       Left: No supraclavicular adenopathy present.  Neurological: She is alert and oriented to person, place, and time.  Skin: Skin  is warm and dry.  Psychiatric: She has a normal mood and affect.  Vitals reviewed.   No results found for this or any previous visit (from the past 24 hour(s)).  Dg Chest 2 View  Result  Date: 08/17/2017 CLINICAL DATA:  Cough for 1 month EXAM: CHEST - 2 VIEW COMPARISON:  10/29/2013 FINDINGS: The heart size and mediastinal contours are within normal limits. Both lungs are clear. The visualized skeletal structures are unremarkable. IMPRESSION: No active cardiopulmonary disease. Electronically Signed   By: Inez Catalina M.D.   On: 08/17/2017 15:47    Asthma Control Test Score of 14  Peak flow reading is 425, about 100% of predicted.   Assessment and Plan :  1. Uncomplicated asthma, unspecified asthma severity, unspecified whether persistent ACT score suggests uncontrolled asthma, however, peak flow very reassuring. Pt did perform self neb earlier today at home. Recommend adding singulair as pt's hx suggestive of atopic asthma. Pt is overall well appearing, NAD.  Lungs CTAB today. Plain films with no acute findings. Due to duration of sx, recommend step up therapy and short course of oral prednisone. Continue with daily allergy zyrtec and Flonase. Advised to -Return to clinic if symptoms worsen, do not improve, or as needed. Given strict ED precautions.     - montelukast (SINGULAIR) 10 MG tablet; Take 1 tablet (10 mg total) by mouth at bedtime.  Dispense: 90 tablet; Refill: 0  2. Cough - DG Chest 2 View; Future - Care order/instruction  3. Wheezing - predniSONE (DELTASONE) 20 MG tablet; Take 2 tablets (40 mg total) by mouth daily with breakfast for 5 days.  Dispense: 10 tablet; Refill: 0  Tenna Delaine, PA-C  [facility] 08/17/2017 4:01 PM

## 2017-08-21 ENCOUNTER — Other Ambulatory Visit: Payer: Self-pay

## 2017-08-21 ENCOUNTER — Encounter: Payer: Self-pay | Admitting: Physician Assistant

## 2017-08-21 ENCOUNTER — Ambulatory Visit (INDEPENDENT_AMBULATORY_CARE_PROVIDER_SITE_OTHER): Payer: Commercial Managed Care - PPO | Admitting: Physician Assistant

## 2017-08-21 VITALS — BP 116/86 | HR 98 | Temp 98.3°F | Resp 18 | Ht 60.63 in | Wt 228.6 lb

## 2017-08-21 DIAGNOSIS — J22 Unspecified acute lower respiratory infection: Secondary | ICD-10-CM | POA: Diagnosis not present

## 2017-08-21 DIAGNOSIS — R12 Heartburn: Secondary | ICD-10-CM

## 2017-08-21 MED ORDER — AZITHROMYCIN 250 MG PO TABS: ORAL_TABLET | ORAL | 0 refills | Status: DC

## 2017-08-21 MED ORDER — RANITIDINE HCL 75 MG PO TABS: 75.0000 mg | ORAL_TABLET | Freq: Two times a day (BID) | ORAL | 0 refills | Status: DC

## 2017-08-21 NOTE — Progress Notes (Signed)
08/21/2017 9:10 AM   DOB: 14-Dec-1975 / MRN: [id number]  SUBJECTIVE:  [name] [name] is a 42 y.o. female with a history of asthmatic bronchitis presenting for continued cough now pushing through pred.  Saw PA Wiseman 4 days ago and did see some improvement with PO steroid, however still can't sleep due to cough.  Tells me that she is also having GERD which is new for her. TDAP is current.   Immunization History  Administered Date(s) Administered  . Influenza Split 04/09/2012  . Influenza,inj,Quad PF,6+ Mos 02/11/2015, 06/08/2016, 12/26/2016  . Influenza-Unspecified 01/29/2014  . Tdap 02/11/2015       She is allergic to other; oxycodone; amoxicillin; codeine; and tramadol.   She  has a past medical history of Allergy, Arthritis, Asthma, Headache(784.0), Obesity, PONV (postoperative nausea and vomiting), and Sleep apnea (08-16-13 ).    She  reports that she has never smoked. She has never used smokeless tobacco. She reports that she drinks alcohol. She reports that she does not use drugs. She  has no sexual activity history on file. The patient  has a past surgical history that includes Knee arthroscopy (Left, 9-10 yrs ago); Foot neuroma surgery (Right, 2013); Tubal ligation (2004); Laparoscopic gastric sleeve resection ([name], 10/29/2013); Upper gi endoscopy (10/29/2013); Shoulder surgery; and Toe Surgery (03/22/2018).  Her family history includes COPD in her other; Diabetes in her other; Lung disease in her father; Pulmonary embolism in her father.  Review of Systems  Constitutional: Negative for chills, diaphoresis and fever.  Respiratory: Positive for cough. Negative for hemoptysis, sputum production, shortness of breath and wheezing.   Cardiovascular: Negative for chest pain and leg swelling.  Gastrointestinal: Negative for nausea.  Skin: Negative for rash.  Neurological: Negative for dizziness.    The problem list and medications were reviewed and updated by myself where  necessary and exist elsewhere in the encounter.   OBJECTIVE:  BP 116/86   Pulse 98   Temp 98.3 F (36.8 C) (Oral)   Resp 18   Ht 5' 0.63" (1.54 m)   Wt 228 lb 9.6 oz (103.7 kg)   LMP 08/15/2017   SpO2 97%   BMI 43.72 kg/m  Peak flow 350 (normal)  Physical Exam  Constitutional: She is oriented to person, place, and time. She appears well-nourished. No distress.  Eyes: Pupils are equal, round, and reactive to light. EOM are normal.  Cardiovascular: Normal rate, regular rhythm, S1 normal, S2 normal, normal heart sounds and intact distal pulses. Exam reveals no gallop, no friction rub and no decreased pulses.  No murmur heard. Pulmonary/Chest: Effort normal. No stridor. No respiratory distress. She has no wheezes. She has no rales.  Abdominal: She exhibits no distension.  Musculoskeletal: She exhibits no edema.  Neurological: She is alert and oriented to person, place, and time. No cranial nerve deficit. Gait normal.  Skin: Skin is dry. She is not diaphoretic.  Psychiatric: She has a normal mood and affect.  Vitals reviewed.   No results found for this or any previous visit (from the past 72 hour(s)).  No results found.  ASSESSMENT AND PLAN:  [name] [name] was seen today for cough.  Diagnoses and all orders for this visit:  LRTI (lower respiratory tract infection) -     azithromycin (ZITHROMAX) 250 MG tablet; Take two tabs today, then one tab daily thereafter.  Heartburn -     ranitidine (ZANTAC 75) 75 MG tablet; Take 1 tablet (75 mg total) by mouth 2 (two) times daily.  The patient is advised to call or return to clinic if she does not see an improvement in symptoms, or to seek the care of the closest emergency department if she worsens with the above plan.   Philis Fendt, MHS, PA-C [facility] 08/21/2017 9:10 AM

## 2017-08-21 NOTE — Patient Instructions (Addendum)
  Please take meds as prescribed. I think your asthma has been treated.  If you have chest pain please come back.    IF you received an x-ray today, you will receive an invoice from [facility]. Please contact [facility] at [phone] with questions or concerns regarding your invoice.   IF you received labwork today, you will receive an invoice from [facility]. Please contact [facility] at [phone] with questions or concerns regarding your invoice.   Our billing staff will not be able to assist you with questions regarding bills from these companies.  You will be contacted with the lab results as soon as they are available. The fastest way to get your results is to activate your My Chart account. Instructions are located on the last page of this paperwork. If you have not heard from [place] regarding the results in 2 weeks, please contact this office.

## 2017-08-30 ENCOUNTER — Ambulatory Visit: Payer: Commercial Managed Care - PPO | Admitting: Physician Assistant

## 2017-09-06 ENCOUNTER — Other Ambulatory Visit: Payer: Self-pay | Admitting: Physician Assistant

## 2017-09-06 DIAGNOSIS — R12 Heartburn: Secondary | ICD-10-CM

## 2017-10-10 ENCOUNTER — Other Ambulatory Visit: Payer: Self-pay | Admitting: Physician Assistant

## 2017-10-10 DIAGNOSIS — R12 Heartburn: Secondary | ICD-10-CM

## 2017-11-09 ENCOUNTER — Other Ambulatory Visit: Payer: Self-pay | Admitting: Physician Assistant

## 2017-11-09 DIAGNOSIS — J45909 Unspecified asthma, uncomplicated: Secondary | ICD-10-CM

## 2018-01-30 DIAGNOSIS — D225 Melanocytic nevi of trunk: Secondary | ICD-10-CM | POA: Diagnosis not present

## 2018-01-30 DIAGNOSIS — D235 Other benign neoplasm of skin of trunk: Secondary | ICD-10-CM | POA: Diagnosis not present

## 2018-01-30 DIAGNOSIS — L218 Other seborrheic dermatitis: Secondary | ICD-10-CM | POA: Diagnosis not present

## 2018-01-30 DIAGNOSIS — R208 Other disturbances of skin sensation: Secondary | ICD-10-CM | POA: Diagnosis not present

## 2018-03-01 DIAGNOSIS — H6691 Otitis media, unspecified, right ear: Secondary | ICD-10-CM | POA: Diagnosis not present

## 2018-03-03 ENCOUNTER — Ambulatory Visit (INDEPENDENT_AMBULATORY_CARE_PROVIDER_SITE_OTHER): Payer: Commercial Managed Care - PPO | Admitting: Family Medicine

## 2018-03-03 ENCOUNTER — Encounter: Payer: Self-pay | Admitting: Family Medicine

## 2018-03-03 ENCOUNTER — Other Ambulatory Visit: Payer: Self-pay

## 2018-03-03 VITALS — BP 127/72 | HR 92 | Temp 98.0°F | Ht 60.63 in | Wt 231.4 lb

## 2018-03-03 DIAGNOSIS — T753XXA Motion sickness, initial encounter: Secondary | ICD-10-CM | POA: Diagnosis not present

## 2018-03-03 DIAGNOSIS — H66011 Acute suppurative otitis media with spontaneous rupture of ear drum, right ear: Secondary | ICD-10-CM

## 2018-03-03 DIAGNOSIS — H6981 Other specified disorders of Eustachian tube, right ear: Secondary | ICD-10-CM

## 2018-03-03 MED ORDER — CEFDINIR 300 MG PO CAPS: 600.0000 mg | ORAL_CAPSULE | Freq: Every day | ORAL | 0 refills | Status: DC

## 2018-03-03 MED ORDER — ONDANSETRON 4 MG PO TBDP: 4.0000 mg | ORAL_TABLET | Freq: Three times a day (TID) | ORAL | 0 refills | Status: DC | PRN

## 2018-03-03 MED ORDER — PREDNISONE 20 MG PO TABS
ORAL_TABLET | ORAL | 0 refills | Status: DC
Start: 2018-03-03 — End: 2019-02-05

## 2018-03-03 NOTE — Progress Notes (Signed)
Patient ID: [name] [name], female    DOB: 03/21/1976  Age: 42 y.o. MRN: [id number]  Chief Complaint  Patient presents with  . Ear Pain    on the right side with drainage of blood and fluid. Went to the doctor this past Thursday, was told she had an ear infection. Was given doxycycline 100mg  tab for 10 days    Subjective:   Patient has had an ear infection for several days she.  She went to the urgent care elsewhere a couple of days ago and was treated with doxycycline because of her penicillin allergy.  She thinks she may be a little bit better this morning after going to the clinic the other day there is a loud pop in her ear and she is been getting drainage from that ear.  She was told she had an ear infection.  Her penicillin allergy history was many years ago on amoxicillin she developed some hives.  No anaphylaxis.  Current allergies, medications, problem list, past/family and social histories reviewed.  Objective:  BP 127/72 (BP Location: Left Arm, Patient Position: Sitting, Cuff Size: Large)   Pulse 92   Temp 98 F (36.7 C) (Oral)   Ht 5' 0.63" (1.54 m)   Wt 231 lb 6.4 oz (105 kg)   SpO2 98%   BMI 44.26 kg/m   No acute distress.  TMs normal on the left.  The right has some soft wax and mucus in the canal.  I can see a portion of the drum that does not look erythematous and I do not see the perforation.  Her throat is clear.  Neck supple without nodes.  Chest clear to auscultation.  Heart regular without murmur.  Assessment & Plan:   Assessment: 1. Non-recurrent acute suppurative otitis media of right ear with spontaneous rupture of tympanic membrane   2. Motion sickness, initial encounter   3. Dysfunction of right eustachian tube       Plan: See instructions.  Gave her some stuff for her motion sickness.  She apparently did not do well with scopolamine so I am using just and Zofran which she has successfully in the past.  No orders of the defined types were  placed in this encounter.   No orders of the defined types were placed in this encounter.        Patient Instructions    Continue the doxycycline for now.  If you are not feeling like you are definitely improving by Monday go ahead and discontinue it and begin Omnicef (cefdinir) 1 pill twice daily or 2 pills once daily.  Continue to use your Flonase  Take prednisone 20 mg 3 pills daily for 2 days, then 2 daily for 2 days, then 1 daily for 2 days  Drink plenty of fluids.  The better hydrated you are the thinner secretions are.  Use the Zofran as needed for nausea of motion sickness.  Return before your trip if you do not feel like you are improving.  2 sprays of Afrin each nostril before flying.     If you have lab work done today you will be contacted with your lab results within the next 2 weeks.  If you have not heard from [place] then please contact us. The fastest way to get your results is to register for My Chart.   IF you received an x-ray today, you will receive an invoice from [facility]. Please contact [facility] at [phone] with questions or concerns regarding your  invoice.   IF you received labwork today, you will receive an invoice from [facility]. Please contact [facility] at [phone] with questions or concerns regarding your invoice.   Our billing staff will not be able to assist you with questions regarding bills from these companies.  You will be contacted with the lab results as soon as they are available. The fastest way to get your results is to activate your My Chart account. Instructions are located on the last page of this paperwork. If you have not heard from [place] regarding the results in 2 weeks, please contact this office.        Return if symptoms worsen or fail to improve.   Ruben Reason, MD 03/03/2018

## 2018-03-03 NOTE — Patient Instructions (Addendum)
  Continue the doxycycline for now.  If you are not feeling like you are definitely improving by Monday go ahead and discontinue it and begin Omnicef (cefdinir) 1 pill twice daily or 2 pills once daily.  Continue to use your Flonase  Take prednisone 20 mg 3 pills daily for 2 days, then 2 daily for 2 days, then 1 daily for 2 days  Drink plenty of fluids.  The better hydrated you are the thinner secretions are.  Use the Zofran as needed for nausea of motion sickness.  Return before your trip if you do not feel like you are improving.  2 sprays of Afrin each nostril before flying.     If you have lab work done today you will be contacted with your lab results within the next 2 weeks.  If you have not heard from [place] then please contact us. The fastest way to get your results is to register for My Chart.   IF you received an x-ray today, you will receive an invoice from [facility]. Please contact [facility]iology at [phone] with questions or concerns regarding your invoice.   IF you received labwork today, you will receive an invoice from [facility]. Please contact [facility] at [phone] with questions or concerns regarding your invoice.   Our billing staff will not be able to assist you with questions regarding bills from these companies.  You will be contacted with the lab results as soon as they are available. The fastest way to get your results is to activate your My Chart account. Instructions are located on the last page of this paperwork. If you have not heard from [place] regarding the results in 2 weeks, please contact this office.

## 2018-03-22 HISTORY — PX: TOE SURGERY: SHX1073

## 2018-10-09 ENCOUNTER — Telehealth: Payer: Self-pay | Admitting: Registered Nurse

## 2018-10-09 ENCOUNTER — Encounter: Payer: Commercial Managed Care - PPO | Admitting: Registered Nurse

## 2018-10-09 NOTE — Telephone Encounter (Signed)
LVM to reschedule appt due to provider being out office

## 2019-02-04 ENCOUNTER — Telehealth: Payer: Self-pay | Admitting: Registered Nurse

## 2019-02-04 NOTE — Telephone Encounter (Signed)
Pt needs letter to return to work .. upload to [place], please advise

## 2019-02-05 ENCOUNTER — Telehealth (INDEPENDENT_AMBULATORY_CARE_PROVIDER_SITE_OTHER): Payer: 59 | Admitting: Family Medicine

## 2019-02-05 DIAGNOSIS — Z8616 Personal history of COVID-19: Secondary | ICD-10-CM

## 2019-02-05 DIAGNOSIS — Z8619 Personal history of other infectious and parasitic diseases: Secondary | ICD-10-CM | POA: Diagnosis not present

## 2019-02-05 NOTE — Telephone Encounter (Signed)
Pt called back to check status. The note is suppose to be for 10/21 through today. She was cleared to go back to work on 02/02/2019

## 2019-02-05 NOTE — Progress Notes (Signed)
Virtual Visit Note  I connected with patient on 02/05/19 at 511pm by phone and verified that I am speaking with the correct person using two identifiers. [name] [name] is currently located at home and patient is currently with them during visit. The provider, Rutherford Guys, MD is located in their office at time of visit.  I discussed the limitations, risks, security and privacy concerns of performing an evaluation and management service by telephone and the availability of in person appointments. I also discussed with the patient that there may be a patient responsible charge related to this service. The patient expressed understanding and agreed to proceed.   CC: covid 19 positive  HPI ? [facility] - covid 19 positive On the 14th, she was at [employer], she had sinus infection, but then her daughters also got sick, so the family got tested, they were all positive  She was had head congestion, body aches, fatigue, loss of taste She never had a fever, no cough or SOB Taste is returning She is feeling much better She has been in quarantine from work [facility] has released her  She needs a letter to return to work to Friday nov 6th  Allergies  Allergen Reactions  . Other Other (See Comments)    Nut allergy rash, tongue swells  . Oxycodone   . Amoxicillin Rash  . Codeine Nausea And Vomiting and Rash  . Tramadol Nausea And Vomiting and Rash    Prior to Admission medications   Medication Sig Start Date End Date Taking? Authorizing Provider  albuterol (PROVENTIL) (2.5 MG/3ML) 0.083% nebulizer solution Take 3 mLs (2.5 mg total) by nebulization every 6 (six) hours as needed for wheezing or shortness of breath. 04/29/17  Yes Tenna Delaine D, PA-C  Antiseborrheic Products, Misc. (PROMISEB) CREA Apply 1 application topically every morning.   Yes [provider]  Biotin 1000 MCG CHEW Chew by mouth.   Yes [provider]  calcium  citrate-vitamin D (CITRACAL+D) 315-200 MG-UNIT tablet Take by mouth.   Yes [provider]  cefdinir (OMNICEF) 300 MG capsule Take 2 capsules (600 mg total) by mouth daily. 03/03/18  Yes Posey Boyer, MD  clobetasol (TEMOVATE) 0.05 % external solution Apply 1 application topically daily as needed (Itching). To scalp 03/20/13  Yes [provider]  Cyanocobalamin (VITAMIN B-12) 1000 MCG SUBL Place under the tongue.   Yes [provider]  cyclobenzaprine (FLEXERIL) 10 MG tablet TAKE 1 TABLET (10 MG TOTAL) BY MOUTH TWO (2) TIMES A DAY AS NEEDED FOR MUSCLE SPASMS. 03/03/17  Yes [provider]  Fluocinonide 0.1 % CREA Apply topically daily as needed.   Yes [provider]  hydrOXYzine (ATARAX/VISTARIL) 25 MG tablet Take 1 tablet (25 mg total) by mouth every 6 (six) hours as needed. May increase dose by 25mg  every 7 days as needed. Max dose 100mg  04/25/17  Yes McVey, Gelene Mink, PA-C  Lidocaine 4 % GEL Apply 1 application topically two (2) times a day as needed. 01/18/17  Yes [provider]  montelukast (SINGULAIR) 10 MG tablet TAKE 1 TABLET BY MOUTH EVERYDAY AT BEDTIME 11/09/17  Yes Timmothy Euler, [place] D, PA-C  Multiple Vitamin (MULTIVITAMIN) capsule Take by mouth.   Yes [provider]    Past Medical History:  Diagnosis Date  . Allergy   . Arthritis   . Asthma   . Headache(784.0)    migraine  . Obesity   . PONV (postoperative nausea  and vomiting)   . Sleep apnea 08-16-13    mild no cpap needed per sleep study results    Past Surgical History:  Procedure Laterality Date  . FOOT NEUROMA SURGERY Right 2013  . KNEE ARTHROSCOPY Left 9-10 yrs ago  . 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 SHOULDER SURGERY    . TOE SURGERY  03/22/2018   cyst off  of left toe bone removed  . TUBAL LIGATION  2004  . UPPER GI ENDOSCOPY  10/29/2013   Procedure: UPPER GI ENDOSCOPY;  Surgeon: [name], MD;  Location: [facility];  Service: General;;    Social History   Tobacco Use  . Smoking status: Never Smoker  . Smokeless tobacco: Never Used  Substance Use Topics  . Alcohol use: Yes    Comment: occasionally    Family History  Problem Relation Age of Onset  . Lung disease Father   . Pulmonary embolism Father   . COPD Other   . Diabetes Other     ROS Per hpi  Objective  Vitals as reported by the patient: none   ASSESSMENT and PLAN 1. History of 2019 novel coronavirus disease (COVID-19) Recovering well. Has completed quarantine. Able to return to work wo restrictions. Letter given.  FOLLOW-UP: prn   The above assessment and management plan was discussed with the patient. The patient verbalized understanding of and has agreed to the management plan. Patient is aware to call the clinic if symptoms persist or worsen. Patient is aware when to return to the clinic for a follow-up visit. Patient educated on when it is appropriate to go to the emergency department.    I provided 7 minutes of non-face-to-face time during this encounter.  Rutherford Guys, MD [facility] [place],  [id number] Ph.  [phone] Fax [phone]

## 2019-02-05 NOTE — Progress Notes (Signed)
Pt states she was tested for COVID-19 at [facility] on 10/21 and was was positive. She states she has been in self isolation and not she has no symptoms. She states she needs a note to return to work. Has been out of work since 01/23/2019.

## 2019-02-07 ENCOUNTER — Other Ambulatory Visit: Payer: Self-pay

## 2019-02-07 ENCOUNTER — Ambulatory Visit: Payer: 59 | Admitting: Family Medicine

## 2019-02-07 ENCOUNTER — Encounter: Payer: Self-pay | Admitting: Family Medicine

## 2019-02-07 VITALS — BP 122/85 | HR 91 | Temp 98.5°F | Wt 236.4 lb

## 2019-02-07 DIAGNOSIS — Z23 Encounter for immunization: Secondary | ICD-10-CM

## 2019-02-07 DIAGNOSIS — H6691 Otitis media, unspecified, right ear: Secondary | ICD-10-CM | POA: Diagnosis not present

## 2019-02-07 MED ORDER — AZITHROMYCIN 250 MG PO TABS: ORAL_TABLET | ORAL | 0 refills | Status: DC

## 2019-02-07 NOTE — Patient Instructions (Addendum)
Start azithromycin for possible early right ear infection.  If any signs of rupture, including fluid coming from that area, I recommend recheck in office to determine if ear nose and throat evaluation needed or additional medicine. Continue Flonase, saline nasal spray  Otitis Media, Adult  Otitis media occurs when there is inflammation and fluid in the middle ear. Your middle ear is a part of the ear that contains bones for hearing as well as air that helps send sounds to your brain. What are the causes? This condition is caused by a blockage in the eustachian tube. This tube drains fluid from the ear to the back of the nose (nasopharynx). A blockage in this tube can be caused by an object or by swelling (edema) in the tube. Problems that can cause a blockage include:  A cold or other upper respiratory infection.  Allergies.  An irritant, such as tobacco smoke.  Enlarged adenoids. The adenoids are areas of soft tissue located high in the back of the throat, behind the nose and the roof of the mouth.  A mass in the nasopharynx.  Damage to the ear caused by pressure changes (barotrauma). What are the signs or symptoms? Symptoms of this condition include:  Ear pain.  A fever.  Decreased hearing.  A headache.  Tiredness (lethargy).  Fluid leaking from the ear.  Ringing in the ear. How is this diagnosed? This condition is diagnosed with a physical exam. During the exam your health care provider will use an instrument called an otoscope to look into your ear and check for redness, swelling, and fluid. He or she will also ask about your symptoms. Your health care provider may also order tests, such as:  A test to check the movement of the eardrum (pneumatic otoscopy). This test is done by squeezing a small amount of air into the ear.  A test that changes air pressure in the middle ear to check how well the eardrum moves and whether the eustachian tube is working  (tympanogram). How is this treated? This condition usually goes away on its own within 3-5 days. But if the condition is caused by a bacteria infection and does not go away own its own, or keeps coming back, your health care provider may:  Prescribe antibiotic medicines to treat the infection.  Prescribe or recommend medicines to control pain. Follow these instructions at home:  Take over-the-counter and prescription medicines only as told by your health care provider.  If you were prescribed an antibiotic medicine, take it as told by your health care provider. Do not stop taking the antibiotic even if you start to feel better.  Keep all follow-up visits as told by your health care provider. This is important. Contact a health care provider if:  You have bleeding from your nose.  There is a lump on your neck.  You are not getting better in 5 days.  You feel worse instead of better. Get help right away if:  You have severe pain that is not controlled with medicine.  You have swelling, redness, or pain around your ear.  You have stiffness in your neck.  A part of your face is paralyzed.  The bone behind your ear (mastoid) is tender when you touch it.  You develop a severe headache. Summary  Otitis media is redness, soreness, and swelling of the middle ear.  This condition usually goes away on its own within 3-5 days.  If the problem does not go away in 3-5  days, your health care provider may prescribe or recommend medicines to treat your symptoms.  If you were prescribed an antibiotic medicine, take it as told by your health care provider. This information is not intended to replace advice given to you by your health care provider. Make sure you discuss any questions you have with your health care provider. Document Released: 12/25/2003 Document Revised: 03/03/2017 Document Reviewed: 03/11/2016 Elsevier Patient Education  [employer].   If you have lab work done  today you will be contacted with your lab results within the next 2 weeks.  If you have not heard from [place] then please contact us. The fastest way to get your results is to register for My Chart.   IF you received an x-ray today, you will receive an invoice from [facility]. Please contact [facility] at [phone] with questions or concerns regarding your invoice.   IF you received labwork today, you will receive an invoice from [place]. Please contact [facility] at [phone] with questions or concerns regarding your invoice.   Our billing staff will not be able to assist you with questions regarding bills from these companies.  You will be contacted with the lab results as soon as they are available. The fastest way to get your results is to activate your My Chart account. Instructions are located on the last page of this paperwork. If you have not heard from [place] regarding the results in 2 weeks, please contact this office.

## 2019-02-07 NOTE — Progress Notes (Signed)
Subjective:  Patient ID: [name] [name], female    DOB: 04-26-1975  Age: 43 y.o. MRN: [id number]  CC:  Chief Complaint  Patient presents with  . Ear Pain    pain in right and left ear since Wed pain in the right is worse than left pt states she can fill the fluid building up in both ears     HPI [name] [name] presents for   Ear pressure: Started with sinus infection 10/14 - zpak and prednisone. covid infection - started with sinus symptoms - tested positive on 10/21 at [facility]. Symptomatic care, treated at home. Head cold and fatigue. Improved until 2 days ago  R ear pain - pressure on and off. R ear worse than left. No fever.  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  No current cough. Min nasal congestion - building back up. Using flonase daily.  Saline NS - not used recently.  1-2 loose stools per day with COVID-19 infection.   History Patient Active Problem List   Diagnosis Date Noted  . 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 Gallstones 10/15/2013  . Morbid obesity ([place]) 09/21/2012  . Asthma 05/14/2011   Past Medical History:  Diagnosis Date  . Allergy   . Arthritis   . Asthma   . Headache(784.0)    migraine  . Obesity   . PONV (postoperative nausea and vomiting)   . Sleep apnea 08-16-13    mild no cpap needed per sleep study results   Past Surgical History:  Procedure Laterality Date  . FOOT NEUROMA SURGERY Right 2013  . KNEE ARTHROSCOPY Left 9-10 yrs ago  . 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 SHOULDER SURGERY    . TOE SURGERY  03/22/2018   cyst off of left toe bone removed  . TUBAL LIGATION  2004  . UPPER GI ENDOSCOPY  10/29/2013   Procedure: UPPER GI ENDOSCOPY;  Surgeon: [name], MD;  Location: [facility];  Service: General;;   Allergies  Allergen  Reactions  . Other Other (See Comments)    Nut allergy rash, tongue swells  . Oxycodone   . Amoxicillin Rash  . Codeine Nausea And Vomiting and Rash  . Tramadol Nausea And Vomiting and Rash   Prior to Admission medications   Medication Sig Start Date End Date Taking? Authorizing Provider  albuterol (PROVENTIL) (2.5 MG/3ML) 0.083% nebulizer solution Take 3 mLs (2.5 mg total) by nebulization every 6 (six) hours as needed for wheezing or shortness of breath. 04/29/17  Yes Tenna Delaine D, PA-C  Antiseborrheic Products, Misc. (PROMISEB) CREA Apply 1 application topically every morning.   Yes [provider]  Biotin 1000 MCG CHEW Chew by mouth.   Yes [provider]  calcium citrate-vitamin D (CITRACAL+D) 315-200 MG-UNIT tablet Take by mouth.   Yes [provider]  cefdinir (OMNICEF) 300 MG capsule Take 2 capsules (600 mg total) by mouth daily. 03/03/18  Yes Posey Boyer, MD  clobetasol (TEMOVATE) 0.05 % external solution Apply 1 application topically daily as needed (Itching). To scalp 03/20/13  Yes [provider]  Cyanocobalamin (VITAMIN B-12) 1000 MCG SUBL Place under the tongue.   Yes [provider]  cyclobenzaprine (FLEXERIL) 10 MG tablet TAKE 1 TABLET (10 MG TOTAL) BY MOUTH TWO (2) TIMES A DAY AS NEEDED  FOR MUSCLE SPASMS. 03/03/17  Yes [provider]  Fluocinonide 0.1 % CREA Apply topically daily as needed.   Yes [provider]  hydrOXYzine (ATARAX/VISTARIL) 25 MG tablet Take 1 tablet (25 mg total) by mouth every 6 (six) hours as needed. May increase dose by 25mg  every 7 days as needed. Max dose 100mg  04/25/17  Yes McVey, Gelene Mink, PA-C  Lidocaine 4 % GEL Apply 1 application topically two (2) times a day as needed. 01/18/17  Yes [provider]  montelukast (SINGULAIR) 10 MG tablet TAKE 1 TABLET BY MOUTH EVERYDAY AT BEDTIME 11/09/17  Yes Timmothy Euler, [place] D, PA-C  Multiple Vitamin (MULTIVITAMIN) capsule  Take by mouth.   Yes [provider]   Social History   Socioeconomic History  . Marital status: Single    Spouse name: Not on file  . Number of children: Not on file  . Years of education: Not on file  . Highest education level: Not on file  Occupational History  . Not on file  Social Needs  . Financial resource strain: Not on file  . Food insecurity    Worry: Not on file    Inability: Not on file  . Transportation needs    Medical: Not on file    Non-medical: Not on file  Tobacco Use  . Smoking status: Never Smoker  . Smokeless tobacco: Never Used  Substance and Sexual Activity  . Alcohol use: Yes    Comment: occasionally  . Drug use: No  . Sexual activity: Yes    Birth control/protection: Surgical  Lifestyle  . Physical activity    Days per week: Not on file    Minutes per session: Not on file  . Stress: Not on file  Relationships  . Social Herbalist on phone: Not on file    Gets together: Not on file    Attends religious service: Not on file    Active member of club or organization: Not on file    Attends meetings of clubs or organizations: Not on file    Relationship status: Not on file  . Intimate partner violence    Fear of current or ex partner: Not on file    Emotionally abused: Not on file    Physically abused: Not on file    Forced sexual activity: Not on file  Other Topics Concern  . Not on file  Social History Narrative  . Not on file    Review of Systems Per HPI  Objective:   Vitals:   02/07/19 1040  BP: 122/85  Pulse: 91  Temp: 98.5 F (36.9 C)  TempSrc: Oral  SpO2: 95%  Weight: 236 lb 6.4 oz (107.2 kg)     Physical Exam Vitals signs reviewed.  Constitutional:      General: She is not in acute distress.    Appearance: She is well-developed.  HENT:     Head: Normocephalic and atraumatic.     Right Ear: Hearing, ear canal and external ear normal. There is impacted cerumen (some cerumen removed with white  loop curette. no complcations. ). Tympanic membrane is bulging (dull, white-clear fluid behind tm, min erythema. canal otherwise clear, no sign of rupture. ).     Left Ear: Hearing, tympanic membrane, ear canal and external ear normal.     Mouth/Throat:     Pharynx: No oropharyngeal exudate.  Eyes:     Conjunctiva/sclera: Conjunctivae normal.     Pupils: Pupils are equal, round, and  reactive to light.  Cardiovascular:     Rate and Rhythm: Normal rate and regular rhythm.     Heart sounds: Normal heart sounds. No murmur.  Pulmonary:     Effort: Pulmonary effort is normal. No respiratory distress.     Breath sounds: Normal breath sounds. No wheezing or rhonchi.  Skin:    General: Skin is warm and dry.     Findings: No rash.  Neurological:     Mental Status: She is alert and oriented to person, place, and time.  Psychiatric:        Behavior: Behavior normal.        Assessment & Plan:  [name] [name] is a 43 y.o. female . Right otitis media, unspecified otitis media type - Plan: azithromycin (ZITHROMAX) 250 MG tablet  Need for prophylactic vaccination and inoculation against influenza - Plan: Flu Vaccine QUAD 6+ mos PF IM (Fluarix Quad PF)  Early right acute otitis media likely, prior history of rupture.  Start azithromycin, potential side effects and risk of antibiotics including with recent antibiotic were discussed.  RTC precautions given.  Signs or symptoms of rupture discussed with RTC precautions given.   No orders of the defined types were placed in this encounter.  Patient Instructions       If you have lab work done today you will be contacted with your lab results within the next 2 weeks.  If you have not heard from [place] then please contact us. The fastest way to get your results is to register for My Chart.   IF you received an x-ray today, you will receive an invoice from [facility]. Please contact [facility]iology at [phone] with questions  or concerns regarding your invoice.   IF you received labwork today, you will receive an invoice from [place]. Please contact [facility] at [phone] with questions or concerns regarding your invoice.   Our billing staff will not be able to assist you with questions regarding bills from these companies.  You will be contacted with the lab results as soon as they are available. The fastest way to get your results is to activate your My Chart account. Instructions are located on the last page of this paperwork. If you have not heard from [place] regarding the results in 2 weeks, please contact this office.          Signed, [name], MD [facility]

## 2019-02-13 NOTE — Telephone Encounter (Signed)
Please update letter. Thanks

## 2019-02-13 NOTE — Telephone Encounter (Signed)
Can change this to 10/31  It says 11/6 ?

## 2019-02-22 ENCOUNTER — Encounter: Payer: Self-pay | Admitting: Adult Health Nurse Practitioner

## 2019-02-22 ENCOUNTER — Ambulatory Visit: Payer: 59 | Admitting: Adult Health Nurse Practitioner

## 2019-02-22 ENCOUNTER — Other Ambulatory Visit (HOSPITAL_COMMUNITY)
Admission: RE | Admit: 2019-02-22 | Discharge: 2019-02-22 | Disposition: A | Discharge status: Home / Self Care | Payer: Commercial Managed Care - PPO | Source: Ambulatory Visit | Attending: Adult Health Nurse Practitioner | Admitting: Adult Health Nurse Practitioner

## 2019-02-22 ENCOUNTER — Other Ambulatory Visit: Payer: Self-pay

## 2019-02-22 VITALS — BP 133/82 | HR 83 | Temp 98.4°F | Resp 12 | Ht 60.0 in | Wt 240.0 lb

## 2019-02-22 DIAGNOSIS — Z1231 Encounter for screening mammogram for malignant neoplasm of breast: Secondary | ICD-10-CM

## 2019-02-22 DIAGNOSIS — N3000 Acute cystitis without hematuria: Secondary | ICD-10-CM

## 2019-02-22 DIAGNOSIS — J22 Unspecified acute lower respiratory infection: Secondary | ICD-10-CM | POA: Diagnosis not present

## 2019-02-22 DIAGNOSIS — Z113 Encounter for screening for infections with a predominantly sexual mode of transmission: Secondary | ICD-10-CM

## 2019-02-22 DIAGNOSIS — G950 Syringomyelia and syringobulbia: Secondary | ICD-10-CM | POA: Insufficient documentation

## 2019-02-22 DIAGNOSIS — R300 Dysuria: Secondary | ICD-10-CM | POA: Diagnosis not present

## 2019-02-22 LAB — POCT URINALYSIS DIP (CLINITEK)
Bilirubin, UA: NEGATIVE
Blood, UA: NEGATIVE
Glucose, UA: NEGATIVE mg/dL
Ketones, POC UA: NEGATIVE mg/dL
Nitrite, UA: NEGATIVE
POC PROTEIN,UA: NEGATIVE
Spec Grav, UA: 1.025 (ref 1.010–1.025)
Urobilinogen, UA: 0.2 E.U./dL
pH, UA: 6.5 (ref 5.0–8.0)

## 2019-02-22 LAB — POCT WET + KOH PREP
Trich by wet prep: ABSENT
Yeast by KOH: ABSENT
Yeast by wet prep: ABSENT

## 2019-02-22 MED ORDER — NITROFURANTOIN MONOHYD MACRO 100 MG PO CAPS: 100.0000 mg | ORAL_CAPSULE | Freq: Two times a day (BID) | ORAL | 0 refills | Status: DC

## 2019-02-22 MED ORDER — FLUCONAZOLE 150 MG PO TABS: 150.0000 mg | ORAL_TABLET | Freq: Once | ORAL | 0 refills | Status: AC

## 2019-02-22 NOTE — Patient Instructions (Signed)
° ° ° °  If you have lab work done today you will be contacted with your lab results within the next 2 weeks.  If you have not heard from us then please contact us. The fastest way to get your results is to register for My Chart. ° ° °IF you received an x-ray today, you will receive an invoice from [facility]. Please contact [facility] at [phone] with questions or concerns regarding your invoice.  ° °IF you received labwork today, you will receive an invoice from [facility]. Please contact [facility] at [phone] with questions or concerns regarding your invoice.  ° °Our billing staff will not be able to assist you with questions regarding bills from these companies. ° °You will be contacted with the lab results as soon as they are available. The fastest way to get your results is to activate your My Chart account. Instructions are located on the last page of this paperwork. If you have not heard from us regarding the results in 2 weeks, please contact this office. °  ° ° ° °

## 2019-02-22 NOTE — Addendum Note (Signed)
Addended by: Meredeth Ide on: 02/22/2019 11:45 AM   Modules accepted: Orders

## 2019-02-22 NOTE — Progress Notes (Signed)
Chief Complaint  Patient presents with  . Vaginal Discharge    pt stated --having clear discharge, odor, pai in lower pelvic, urging urinating--2 weeks. Denied fever.    HPI  About 8 days of dysuria with vaginal discharge.  White, thick, and she used OTC Monistat 3 day.  3 days later, symptoms returned now with an odor--not nessisarily an odor from urine.  Urine has been cloudy.  Patient does not drink enough water and drinking numerous glasses of sweet tea which she thinks could be contrbuting to getting her first yeast infection  In 11 years.   She does have have urinary urgency which started yesterday.  No fever, chills, night sweats.       ROS  Review of Systems See HPI Constitution: No fevers or chills No malaise No diaphoresis Skin: No rash or itching Eyes: no blurry vision, no double vision GU: no dysuria or hematuria Neuro: no dizziness or headaches Chief Complaint  Patient presents with  . Vaginal Discharge    pt stated --having clear discharge, odor, pai in lower pelvic, urging urinating--2 weeks. Denied fever.       Patient Active Problem List   Diagnosis Date Noted  . 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 Gallstones 10/15/2013  . Morbid obesity ([place]) 09/21/2012  . Asthma 05/14/2011      Allergies  Allergen Reactions  . Other Other (See Comments)    Nut allergy rash, tongue swells  . Oxycodone   . Amoxicillin Rash  . Codeine Nausea And Vomiting and Rash  . Tramadol Nausea And Vomiting and Rash     Family History  Problem Relation Age of Onset  . Lung disease Father   . Pulmonary embolism Father   . COPD Other   . Diabetes Other      Health Habits: Dental Exam: up to date Eye Exam: up to date  Current exercise activities: walking/running Diet:   Social History   Socioeconomic History  . Marital status: Single    Spouse name: Not on file  . Number of children: Not on file  . Years of education: Not on file  .  Highest education level: Not on file  Occupational History  . Not on file  Social Needs  . Financial resource strain: Not on file  . Food insecurity    Worry: Not on file    Inability: Not on file  . Transportation needs    Medical: Not on file    Non-medical: Not on file  Tobacco Use  . Smoking status: Never Smoker  . Smokeless tobacco: Never Used  Substance and Sexual Activity  . Alcohol use: Yes    Comment: occasionally  . Drug use: No  . Sexual activity: Yes    Birth control/protection: Surgical  Lifestyle  . Physical activity    Days per week: Not on file    Minutes per session: Not on file  . Stress: Not on file  Relationships  . Social Herbalist on phone: Not on file    Gets together: Not on file    Attends religious service: Not on file    Active member of club or organization: Not on file    Attends meetings of clubs or organizations: Not on file    Relationship status: Not on file  . Intimate partner violence    Fear of current or ex partner: Not on file    Emotionally abused: Not on file  Physically abused: Not on file    Forced sexual activity: Not on file  Other Topics Concern  . Not on file  Social History Narrative  . Not on file   Social History   Substance and Sexual Activity  Alcohol Use Yes   Comment: occasionally   Social History   Tobacco Use  Smoking Status Never Smoker  Smokeless Tobacco Never Used   Social History   Substance and Sexual Activity  Drug Use No    GYN: Sexual Health Menstrual status: regular menses LMP: No LMP recorded. Last pap smear: see HM section History of abnormal pap smears:  Sexually active: ** with ** partner Current contraception:   Health Maintenance: See under health Maintenance activity for review of completion dates as well. Immunization History  Administered Date(s) Administered  . Influenza Split 04/09/2012  . Influenza,inj,Quad PF,6+ Mos 02/11/2015, 06/08/2016, 12/26/2016,  02/07/2019  . Influenza-Unspecified 01/29/2014  . Tdap 02/11/2015      Depression Screen-PHQ2/9 Depression screen [facility] 2/9 02/22/2019 02/07/2019 02/05/2019 03/03/2018 08/21/2017  Decreased Interest 0 0 0 0 0  Down, Depressed, Hopeless 0 0 0 0 0  PHQ - 2 Score 0 0 0 0 0       Depression Severity and Treatment Recommendations:  0-4= None  5-9= Mild / Treatment: Support, educate to call if worse; return in one month  10-14= Moderate / Treatment: Support, watchful waiting; Antidepressant or Psycotherapy  15-19= Moderately severe / Treatment: Antidepressant OR Psychotherapy  >= 20 = Major depression, severe / Antidepressant AND Psychotherapy    Review of Systems   Review of Systems  Constitutional: Negative for chills and fever.  Skin: Negative for itching and rash.    See HPI for ROS as well.    Objective:   Vitals:   02/22/19 1032  BP: 133/82  Pulse: 83  Resp: 12  Temp: 98.4 F (36.9 C)  SpO2: 96%  Weight: 240 lb (108.9 kg)  Height: 5' (1.524 m)    Body mass index is 46.87 kg/m.  Physical Exam Vitals signs and nursing note reviewed. Exam conducted with a chaperone present.  Constitutional:      Appearance: Normal appearance.  Abdominal:     General: Bowel sounds are normal.     Palpations: Abdomen is soft.     Tenderness: There is no right CVA tenderness, left CVA tenderness or guarding.  Genitourinary:    General: Normal vulva.     Comments: Erythema to labia bilaterally without excorciation.  No obvious dischargfe.  On speculum exam, noted discharge tro the cervix.  Yellow-=brown, homogenous discharge.  Neurological:     Mental Status: She is alert.        Assessment/Plan:      Foster was seen today for vaginal discharge.  Diagnoses and all orders for this visit:  Dysuria -     POCT URINALYSIS DIP (CLINITEK)   Patient had 3+ leuks in her urine dip.  We will send to culture.  Mild vaginal discharge on exam.  Will treat with Macrobid 100  mg twice daily x3 days with Diflucan and 1 refill.  GC chlamydia obtained, trichomonas obtained.  Blood work for RPR HIV and hep B. No follow-ups on file.  In addition, Patient has not had a mammogram since 2018 and needs referal. NO hx of breat cancer in family.  No lumps or masses palpated.   Body mass index is 46.87 kg/m.:  Discussed the patient's BMI with patient. The BMI body mass index is 46.87  kg/m.     Future Appointments  Date Time Provider [place]  03/08/2019  8:10 AM Maximiano Coss, NP PCP-PCP [facility]      Assessment & Plan:  MILEAH TETERS is a 43 y.o. female . Dysuria - Plan: POCT URINALYSIS DIP (CLINITEK) No orders of the defined types were placed in this encounter.  Patient Instructions       If you have lab work done today you will be contacted with your lab results within the next 2 weeks.  If you have not heard from [place] then please contact us. The fastest way to get your results is to register for My Chart.   IF you received an x-ray today, you will receive an invoice from [facility]. Please contact [facility] at [phone] with questions or concerns regarding your invoice.   IF you received labwork today, you will receive an invoice from [place]. Please contact [facility] at [phone] with questions or concerns regarding your invoice.   Our billing staff will not be able to assist you with questions regarding bills from these companies.  You will be contacted with the lab results as soon as they are available. The fastest way to get your results is to activate your My Chart account. Instructions are located on the last page of this paperwork. If you have not heard from [place] regarding the results in 2 weeks, please contact this office.          Glyn Ade, NP

## 2019-02-23 LAB — URINE CULTURE

## 2019-02-23 LAB — RPR: RPR Ser Ql: NONREACTIVE

## 2019-02-23 LAB — HEPATITIS B SURFACE ANTIGEN: Hepatitis B Surface Ag: NEGATIVE

## 2019-02-23 LAB — HIV ANTIBODY (ROUTINE TESTING W REFLEX): HIV Screen 4th Generation wRfx: NONREACTIVE

## 2019-02-25 LAB — GC/CHLAMYDIA PROBE AMP (~~LOC~~) NOT AT ARMC
Chlamydia: NEGATIVE
Comment: NEGATIVE
Comment: NORMAL
Neisseria Gonorrhea: NEGATIVE

## 2019-03-08 ENCOUNTER — Encounter: Payer: 59 | Admitting: Registered Nurse

## 2019-03-08 ENCOUNTER — Ambulatory Visit: Payer: 59 | Admitting: Adult Health Nurse Practitioner

## 2019-03-08 ENCOUNTER — Other Ambulatory Visit: Payer: Self-pay

## 2019-03-08 ENCOUNTER — Encounter: Payer: Self-pay | Admitting: Adult Health Nurse Practitioner

## 2019-03-08 DIAGNOSIS — R062 Wheezing: Secondary | ICD-10-CM | POA: Diagnosis not present

## 2019-03-08 MED ORDER — BUDESONIDE-FORMOTEROL FUMARATE 80-4.5 MCG/ACT IN AERO: 2.0000 | INHALATION_SPRAY | Freq: Every day | RESPIRATORY_TRACT | 6 refills | Status: DC

## 2019-03-08 MED ORDER — ALBUTEROL SULFATE HFA 108 (90 BASE) MCG/ACT IN AERS: 2.0000 | INHALATION_SPRAY | Freq: Four times a day (QID) | RESPIRATORY_TRACT | 0 refills | Status: AC | PRN

## 2019-03-08 NOTE — Progress Notes (Signed)
Chief Complaint  Patient presents with  . Transitions Of Care    pt stated --establish  . Medication Refill    pt stated --since have a different insurance need Rx Rx symbicort 80-4.5 and Albuterol inhaler.    HPI   Patient reports she has used Symbicort in the past and it worked well at controlling her asthma.  Would like a refill today.  She also needs a refill on rescue inhaler.  She is using it intermittently.  Has not had PFTs in sometime.    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  Medications    Current Outpatient Medications:  .  albuterol (PROVENTIL) (2.5 MG/3ML) 0.083% nebulizer solution, Take 3 mLs (2.5 mg total) by nebulization every 6 (six) hours as needed for wheezing or shortness of breath., Disp: 150 mL, Rfl: 1 .  Biotin 1000 MCG CHEW, Chew by mouth., Disp: , Rfl:  .  calcium citrate-vitamin D (CITRACAL+D) 315-200 MG-UNIT tablet, Take by mouth., Disp: , Rfl:  .  clobetasol (TEMOVATE) 0.05 % external solution, Apply 1 application topically daily as needed (Itching). To scalp, Disp: , Rfl:  .  Cyanocobalamin (VITAMIN B-12) 1000 MCG SUBL, Place under the tongue., Disp: , Rfl:  .  cyclobenzaprine (FLEXERIL) 10 MG tablet, TAKE 1 TABLET (10 MG TOTAL) BY MOUTH TWO (2) TIMES A DAY AS NEEDED FOR MUSCLE SPASMS., Disp: , Rfl: 0 .  Fluocinonide 0.1 % CREA, Apply topically daily as needed., Disp: , Rfl:  .  hydrOXYzine (ATARAX/VISTARIL) 25 MG tablet, Take 1 tablet (25 mg total) by mouth every 6 (six) hours as needed. May increase dose by 25mg  every 7 days as needed. Max dose 100mg , Disp: 90 tablet, Rfl: 1 .  Lidocaine 4 % GEL, Apply 1 application topically two (2) times a day as needed., Disp: , Rfl:  .  montelukast (SINGULAIR) 10 MG tablet, TAKE 1 TABLET  BY MOUTH EVERYDAY AT BEDTIME, Disp: 30 tablet, Rfl: 2 .  Multiple Vitamin (MULTIVITAMIN) capsule, Take by mouth., Disp: , Rfl:  .  albuterol (VENTOLIN HFA) 108 (90 Base) MCG/ACT inhaler, Inhale 2 puffs into the lungs every 6 (six) hours as needed for wheezing or shortness of breath., Disp: 18 g, Rfl: 0 .  budesonide-formoterol (SYMBICORT) 80-4.5 MCG/ACT inhaler, Inhale 2 puffs into the lungs daily., Disp: 1 Inhaler, Rfl: 6  Current Facility-Administered Medications:  .  albuterol (PROVENTIL) (2.5 MG/3ML) 0.083% nebulizer solution 2.5 mg, 2.5 mg, Nebulization, Once, Le, Thao P, DO   Review of Systems    Constitutional: Negative for activity change, appetite change, chills and fever.  HENT: Negative for congestion, nosebleeds, trouble swallowing and voice change.   Respiratory: Negative for cough, shortness of breath and wheezing.   Gastrointestinal: Negative for diarrhea, nausea and vomiting.  Genitourinary: Negative for difficulty urinating, dysuria, flank pain and hematuria.  Musculoskeletal: Negative for back pain, joint swelling and neck pain.  Neurological: Negative for dizziness, speech difficulty, light-headedness and numbness.  See HPI. All other review of systems negative.     Physical Exam:   Physical Examination: General appearance - alert, well appearing, and in no distress and oriented to person, place, and time Mental status - normal mood, behavior, speech, dress, motor activity, and thought processes Eyes - not examined, Visually impaired -Peter's anomaly Neck - supple, no significant adenopathy,  carotids upstroke normal bilaterally, no bruits, thyroid exam: thyroid is normal in size without nodules or tenderness Chest - clear to auscultation, no wheezes, rales or rhonchi, symmetric air entry  Heart - normal rate, regular rhythm, normal S1, S2, no murmurs, rubs, clicks or gallops Extremities - dependent LE edema without clubbing or cyanosis Skin - normal coloration and  turgor, no rashes, no suspicious skin lesions noted  No hyperpigmentation of skin.  No current hematomas noted    Assessment & Plan:  [name] [name] is a 43 y.o. female . 1. Wheezing    No orders of the defined types were placed in this encounter.  Meds ordered this encounter  Medications  . budesonide-formoterol (SYMBICORT) 80-4.5 MCG/ACT inhaler    Sig: Inhale 2 puffs into the lungs daily.    Dispense:  1 Inhaler    Refill:  6  . albuterol (VENTOLIN HFA) 108 (90 Base) MCG/ACT inhaler    Sig: Inhale 2 puffs into the lungs every 6 (six) hours as needed for wheezing or shortness of breath.    Dispense:  18 g    Refill:  0   Refilled meds as requested.  Recommended physical activity and PFTs when she returns.  She is inline with this plan.   Glyn Ade, NP

## 2019-03-19 ENCOUNTER — Encounter: Payer: Self-pay | Admitting: *Deleted

## 2019-03-19 NOTE — Progress Notes (Signed)
Letter revised to 10.31

## 2019-05-02 ENCOUNTER — Encounter (INDEPENDENT_AMBULATORY_CARE_PROVIDER_SITE_OTHER): Payer: Self-pay | Admitting: Family Medicine

## 2019-05-02 ENCOUNTER — Other Ambulatory Visit: Payer: Self-pay

## 2019-05-02 ENCOUNTER — Ambulatory Visit (INDEPENDENT_AMBULATORY_CARE_PROVIDER_SITE_OTHER): Payer: 59 | Admitting: Family Medicine

## 2019-05-02 VITALS — BP 118/81 | HR 91 | Temp 98.0°F | Ht 60.0 in | Wt 232.0 lb

## 2019-05-02 DIAGNOSIS — F3289 Other specified depressive episodes: Secondary | ICD-10-CM

## 2019-05-02 DIAGNOSIS — M722 Plantar fascial fibromatosis: Secondary | ICD-10-CM

## 2019-05-02 DIAGNOSIS — M542 Cervicalgia: Secondary | ICD-10-CM

## 2019-05-02 DIAGNOSIS — R0602 Shortness of breath: Secondary | ICD-10-CM | POA: Diagnosis not present

## 2019-05-02 DIAGNOSIS — R5383 Other fatigue: Secondary | ICD-10-CM | POA: Diagnosis not present

## 2019-05-02 DIAGNOSIS — Z0289 Encounter for other administrative examinations: Secondary | ICD-10-CM

## 2019-05-02 DIAGNOSIS — J45909 Unspecified asthma, uncomplicated: Secondary | ICD-10-CM | POA: Diagnosis not present

## 2019-05-02 DIAGNOSIS — Z6841 Body Mass Index (BMI) 40.0 and over, adult: Secondary | ICD-10-CM

## 2019-05-02 DIAGNOSIS — Z9889 Other specified postprocedural states: Secondary | ICD-10-CM

## 2019-05-02 NOTE — Progress Notes (Signed)
Dear [name] Hurl, PA-C,   Thank you for referring [name] [name] to our clinic. The following note includes my evaluation and treatment recommendations.  Chief Complaint:   [name] [name] (MR# [id number]) is a 44 y.o. female who presents for evaluation and treatment of obesity and related comorbidities. Current BMI is Body mass index is 45.31 kg/m. [name] [name] has been struggling with her weight for many years and has been unsuccessful in either losing weight, maintaining weight loss, or reaching her healthy weight goal.  [name] [name] is currently in the action stage of change and ready to dedicate time achieving and maintaining a healthier weight. [name] [name] is interested in becoming our patient and working on intensive lifestyle modifications including (but not limited to) diet and exercise for weight loss.  [name] [name]'s habits were reviewed today and are as follows: Her family eats meals together, she thinks her family will eat healthier with her, her desired weight loss is 56 pounds, she has been heavy most of her life, she started gaining weight after the birth of her first child, her heaviest weight ever was 285 pounds, she is a picky eater, she craves [place] food and chocolate, she skips breakfast frequently, she is frequently drinking liquids with calories, she sometimes makes poor food choices, she sometimes eats larger portions than normal and she struggles with emotional eating.  Depression Screen [name] [name]'s Food and Mood (modified PHQ-9) score was 8.  Depression screen [name] [name] 2/9 05/02/2019  Decreased Interest 2  Down, Depressed, Hopeless 0  PHQ - 2 Score 2  Altered sleeping 1  Tired, decreased energy 2  Change in appetite 1  Feeling bad or failure about yourself  1  Trouble concentrating 1  Moving slowly or fidgety/restless 0  Suicidal thoughts 0  PHQ-9 Score 8  Difficult doing work/chores Not difficult at all   Subjective:   1. Other  fatigue [name] [name] denies daytime somnolence and reports waking up still tired. Patent has a history of symptoms of morning fatigue, morning headache and snoring. [name] [name] generally gets 4-6 hours of sleep per night, and states that she has poor sleep quality. Snoring is present. Apneic episodes are not present. Epworth Sleepiness Score is 4.  2. Shortness of breath on exertion [name] [name] does feel that she gets out of breath more easily that she used to when she exercises. [name] [name]'s shortness of breath appears to be obesity related and exercise induced. She has agreed to work on weight loss and gradually increase exercise to treat her exercise induced shortness of breath. Will continue to monitor closely.  3. 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  She takes medication daily for this.  4. Neck pain Lowest cervical vertebrae, from fall.  5. Plantar fasciitis [name] [name] has plantar fasciitis in her right foot.  6. Other depression, with emotioanl eating [name] [name] is struggling with emotional eating and using food for comfort to the extent that it is negatively impacting her health. She has been working on behavior modification techniques to help reduce her emotional eating and has been unsuccessful. She shows no sign of suicidal or homicidal ideations.  7. History of gastric surgery [name] [name] has a history of VSG on 10/29/2013 with Dr. Hassell Done.  She is taking a regular multivitamin.  Assessment/Plan:   1. Other fatigue [name] [name] does feel that her weight is causing her energy to be lower than it should be. Fatigue may be related to obesity, depression or many other causes. Labs will be ordered,  and in the meanwhile, [name] [name] will focus on self care including making healthy food choices, increasing physical activity and focusing on stress reduction.  Will consider sleep study if weight loss stalls.  Orders - EKG  12-Lead - Anemia panel - CBC with Differential/Platelet - Comprehensive metabolic panel - Hemoglobin A1c - Insulin, random - VITAMIN D 25 Hydroxy (Vit-D Deficiency, Fractures) - T3 - T4, free - TSH  2. Shortness of breath on exertion [name] [name] does feel that she gets out of breath more easily that she used to when she exercises. [name] [name]'s shortness of breath appears to be obesity related and exercise induced. She has agreed to work on weight loss and gradually increase exercise to treat her exercise induced shortness of breath. Will continue to monitor closely.  Orders - Lipid Panel With LDL/HDL Ratio  3. Uncomplicated asthma, unspecified asthma severity, unspecified whether persistent Controlled at this time.  4. Neck pain Will follow because mobility and pain control are important for weight management.  5. Plantar fasciitis Will follow because mobility and pain control are important for weight management.  6. Other depression, with emotional eating Behavior modification techniques were discussed today to help [name] [name] deal with her emotional/non-hunger eating behaviors.  Orders and follow up as documented in patient record.   7. History of gastric surgery [name] [name] is at risk for malnutrition due to her previous bariatric surgery.   Counseling  You may need to eat 3 meals and 2 snacks, or 5 small meals each day in order to reach your protein and calorie goals.   Allow at least 15 minutes for each meal so that you can eat mindfully. Listen to your body so that you do not overeat. For most people, your sleeve or pouch will comfortably hold 4-6 ounces.  Eat foods from all food groups. This includes fruits and vegetables, grains, dairy, and meat and other proteins.  Include a protein-rich food at every meal and snack, and eat the protein food first.   You should be taking a Bariatric Multivitamin as well as calcium.   8. Class 3 severe obesity with serious comorbidity  and body mass index (BMI) of 45.0 to 49.9 in adult, unspecified obesity type (HCC) [name] [name] is currently in the action stage of change and her goal is to continue with weight loss efforts. I recommend [name] [name] begin the structured treatment plan as follows:  She has agreed to the Category 1 Plan.  Exercise goals:  Treadmill - HIIT for 10 minutes.  Modified squats - 3 intervals.  Stretching.  Behavioral modification strategies: increasing lean protein intake, decreasing simple carbohydrates, increasing vegetables and increasing water intake.  She was informed of the importance of frequent follow-up visits to maximize her success with intensive lifestyle modifications for her multiple health conditions. She was informed we would discuss her lab results at her next visit unless there is a critical issue that needs to be addressed sooner. [name] [name] agreed to keep her next visit at the agreed upon time to discuss these results.  Objective:   Blood pressure 118/81, pulse 91, temperature 98 F (36.7 C), temperature source Oral, height 5' (1.524 m), weight 232 lb (105.2 kg), last menstrual period 04/18/2019, SpO2 98 %. Body mass index is 45.31 kg/m.  EKG: Normal sinus rhythm, rate 92 bpm.  Indirect Calorimeter completed today shows a VO2 of 235 and a REE of 1638.  Her calculated basal metabolic rate is 123456 thus her basal metabolic rate is worse than expected.  General: Cooperative, alert, well  developed, in no acute distress. HEENT: Conjunctivae and lids unremarkable. Cardiovascular: Regular rhythm.  Lungs: Normal work of breathing. Neurologic: No focal deficits.   Lab Results  Component Value Date   CREATININE 0.83 04/11/2014   BUN 10 04/11/2014   NA 141 04/11/2014   K 4.5 04/11/2014   CL 102 04/11/2014   CO2 27 04/11/2014   Lab Results  Component Value Date   ALT 20 04/11/2014   AST 19 04/11/2014   ALKPHOS 56 04/11/2014   BILITOT 0.5 04/11/2014   Lab Results  Component Value  Date   HGBA1C 4.7 04/11/2014   HGBA1C 5.1 05/02/2013   Lab Results  Component Value Date   TSH 2.650 12/26/2016   Lab Results  Component Value Date   WBC 8.8 12/26/2016   HGB 13.6 12/26/2016   HCT 41.4 12/26/2016   MCV 100 (H) 12/26/2016   PLT 293 12/26/2016   Lab Results  Component Value Date   IRON 40 12/26/2016   TIBC 315 12/26/2016   FERRITIN 183 (H) 12/26/2016   Attestation Statements:   This is the patient's first visit at [facility]. The patient's NEW PATIENT PACKET was reviewed at length. Included in the packet: current and past health history, medications, allergies, ROS, gynecologic history (women only), surgical history, family history, social history, weight history, weight loss surgery history (for those that have had weight loss surgery), nutritional evaluation, mood and food questionnaire, PHQ9, Epworth questionnaire, sleep habits questionnaire, patient life and health improvement goals questionnaire. These will all be scanned into the patient's chart under media.   During the visit, I independently reviewed the patient's EKG, bioimpedance scale results, and indirect calorimeter results. I used this information to tailor a meal plan for the patient that will help her to lose weight and will improve her obesity-related conditions going forward. I performed a medically necessary appropriate examination and/or evaluation. I discussed the assessment and treatment plan with the patient. The patient was provided an opportunity to ask questions and all were answered. The patient agreed with the plan and demonstrated an understanding of the instructions. Labs were ordered at this visit and will be reviewed at the next visit unless more critical results need to be addressed immediately. Clinical information was updated and documented in the EMR.   Time spent on visit including pre-visit chart review and post-visit care was 60 minutes.   I, Water quality scientist, CMA, am  acting as Location manager for [employer], DO.  I have reviewed the above documentation for accuracy and completeness, and I agree with the above. Briscoe Deutscher, DO

## 2019-05-03 LAB — CBC WITH DIFFERENTIAL/PLATELET
Basophils Absolute: 0 10*3/uL (ref 0.0–0.2)
Basos: 1 %
EOS (ABSOLUTE): 0 10*3/uL (ref 0.0–0.4)
Eos: 1 %
Hemoglobin: 14 g/dL (ref 11.1–15.9)
Immature Grans (Abs): 0 10*3/uL (ref 0.0–0.1)
Immature Granulocytes: 1 %
Lymphocytes Absolute: 2.1 10*3/uL (ref 0.7–3.1)
Lymphs: 34 %
MCH: 33.3 pg — ABNORMAL HIGH (ref 26.6–33.0)
MCHC: 33.3 g/dL (ref 31.5–35.7)
MCV: 100 fL — ABNORMAL HIGH (ref 79–97)
Monocytes Absolute: 0.5 10*3/uL (ref 0.1–0.9)
Monocytes: 9 %
Neutrophils Absolute: 3.4 10*3/uL (ref 1.4–7.0)
Neutrophils: 54 %
Platelets: 262 10*3/uL (ref 150–450)
RBC: 4.21 x10E6/uL (ref 3.77–5.28)
RDW: 11.8 % (ref 11.7–15.4)
WBC: 6 10*3/uL (ref 3.4–10.8)

## 2019-05-03 LAB — COMPREHENSIVE METABOLIC PANEL
ALT: 14 IU/L (ref 0–32)
AST: 24 IU/L (ref 0–40)
Albumin/Globulin Ratio: 1.8 (ref 1.2–2.2)
Albumin: 4.6 g/dL (ref 3.8–4.8)
Alkaline Phosphatase: 58 IU/L (ref 39–117)
BUN/Creatinine Ratio: 12 (ref 9–23)
BUN: 11 mg/dL (ref 6–24)
Bilirubin Total: 0.3 mg/dL (ref 0.0–1.2)
CO2: 23 mmol/L (ref 20–29)
Calcium: 9.5 mg/dL (ref 8.7–10.2)
Chloride: 102 mmol/L (ref 96–106)
Creatinine, Ser: 0.9 mg/dL (ref 0.57–1.00)
GFR calc Af Amer: 90 mL/min/{1.73_m2} (ref >59)
GFR calc non Af Amer: 78 mL/min/{1.73_m2} (ref >59)
Globulin, Total: 2.5 g/dL (ref 1.5–4.5)
Glucose: 80 mg/dL (ref 65–99)
Potassium: 4.2 mmol/L (ref 3.5–5.2)
Sodium: 138 mmol/L (ref 134–144)
Total Protein: 7.1 g/dL (ref 6.0–8.5)

## 2019-05-03 LAB — T4, FREE: Free T4: 1.29 ng/dL (ref 0.82–1.77)

## 2019-05-03 LAB — LIPID PANEL WITH LDL/HDL RATIO
Cholesterol, Total: 172 mg/dL (ref 100–199)
HDL: 61 mg/dL (ref >39)
LDL Chol Calc (NIH): 92 mg/dL (ref 0–99)
LDL/HDL Ratio: 1.5 ratio (ref 0.0–3.2)
Triglycerides: 109 mg/dL (ref 0–149)
VLDL Cholesterol Cal: 19 mg/dL (ref 5–40)

## 2019-05-03 LAB — ANEMIA PANEL
Ferritin: 71 ng/mL (ref 15–150)
Folate, Hemolysate: 462 ng/mL
Folate, RBC: 1097 ng/mL (ref >498)
Hematocrit: 42.1 % (ref 34.0–46.6)
Iron Saturation: 30 % (ref 15–55)
Iron: 101 ug/dL (ref 27–159)
Retic Ct Pct: 2.1 % (ref 0.6–2.6)
Total Iron Binding Capacity: 333 ug/dL (ref 250–450)
UIBC: 232 ug/dL (ref 131–425)
Vitamin B-12: 466 pg/mL (ref 232–1245)

## 2019-05-03 LAB — T3: T3, Total: 135 ng/dL (ref 71–180)

## 2019-05-03 LAB — VITAMIN D 25 HYDROXY (VIT D DEFICIENCY, FRACTURES): Vit D, 25-Hydroxy: 30.1 ng/mL (ref 30.0–100.0)

## 2019-05-03 LAB — HEMOGLOBIN A1C
Est. average glucose Bld gHb Est-mCnc: 91 mg/dL
Hgb A1c MFr Bld: 4.8 % (ref 4.8–5.6)

## 2019-05-03 LAB — TSH: TSH: 1.93 u[IU]/mL (ref 0.450–4.500)

## 2019-05-03 LAB — INSULIN, RANDOM: INSULIN: 19.9 u[IU]/mL (ref 2.6–24.9)

## 2019-05-10 ENCOUNTER — Other Ambulatory Visit: Payer: Self-pay

## 2019-05-10 ENCOUNTER — Encounter: Payer: Self-pay | Admitting: Adult Health Nurse Practitioner

## 2019-05-10 ENCOUNTER — Ambulatory Visit: Payer: 59 | Admitting: Adult Health Nurse Practitioner

## 2019-05-10 VITALS — BP 119/76 | HR 89 | Temp 98.1°F | Ht 60.0 in | Wt 234.6 lb

## 2019-05-10 DIAGNOSIS — N3001 Acute cystitis with hematuria: Secondary | ICD-10-CM | POA: Diagnosis not present

## 2019-05-10 DIAGNOSIS — R300 Dysuria: Secondary | ICD-10-CM | POA: Diagnosis not present

## 2019-05-10 LAB — POCT URINALYSIS DIP (MANUAL ENTRY)
Bilirubin, UA: NEGATIVE
Glucose, UA: NEGATIVE mg/dL
Nitrite, UA: POSITIVE — AB
Spec Grav, UA: 1.02 (ref 1.010–1.025)
Urobilinogen, UA: 0.2 E.U./dL
pH, UA: 6 (ref 5.0–8.0)

## 2019-05-10 MED ORDER — CIPROFLOXACIN HCL 500 MG PO TABS: 500.0000 mg | ORAL_TABLET | Freq: Two times a day (BID) | ORAL | 0 refills | Status: DC

## 2019-05-10 MED ORDER — PHENAZOPYRIDINE HCL 200 MG PO TABS: 200.0000 mg | ORAL_TABLET | Freq: Three times a day (TID) | ORAL | 0 refills | Status: DC | PRN

## 2019-05-10 NOTE — Patient Instructions (Signed)
° ° ° °  If you have lab work done today you will be contacted with your lab results within the next 2 weeks.  If you have not heard from us then please contact us. The fastest way to get your results is to register for My Chart. ° ° °IF you received an x-ray today, you will receive an invoice from [facility]. Please contact [facility] at [phone] with questions or concerns regarding your invoice.  ° °IF you received labwork today, you will receive an invoice from [facility]. Please contact [facility] at [phone] with questions or concerns regarding your invoice.  ° °Our billing staff will not be able to assist you with questions regarding bills from these companies. ° °You will be contacted with the lab results as soon as they are available. The fastest way to get your results is to activate your My Chart account. Instructions are located on the last page of this paperwork. If you have not heard from us regarding the results in 2 weeks, please contact this office. °  ° ° ° °

## 2019-05-10 NOTE — Progress Notes (Signed)
   05/10/2019  [name] [name] [birth year] [id number]  SUBJECTIVE: [name] [name] is a 44 y.o. female who complains of urinary frequency, urgency and dysuria x 2 days, without flank pain, fever, chills, or abnormal vaginal discharge or bleeding.   OBJECTIVE: Appears well, in no apparent distress.  Vital signs are normal. The abdomen is soft without tenderness, guarding, mass, rebound or organomegaly. No CVA tenderness or inguinal adenopathy noted. Urine dipstick shows positive for WBC's, positive for RBC's, positive for nitrates and positive for leukocytes.  Micro exam: not done.   ASSESSMENT: UTI uncomplicated without evidence of pyelonephritis  PLAN: Treatment per orders - also push fluids, may use Pyridium OTC prn. Call or return to clinic prn if these symptoms worsen or fail to improve as anticipated.  Meds ordered this encounter  Medications  . phenazopyridine (PYRIDIUM) 200 MG tablet    Sig: Take 1 tablet (200 mg total) by mouth 3 (three) times daily as needed for pain.    Dispense:  10 tablet    Refill:  0  . ciprofloxacin (CIPRO) 500 MG tablet    Sig: Take 1 tablet (500 mg total) by mouth 2 (two) times daily.    Dispense:  6 tablet    Refill:  0   Orders Placed This Encounter  Procedures  . Urine Culture  . POCT urinalysis dipstick    [name] Ade, NP

## 2019-05-12 LAB — URINE CULTURE

## 2019-05-15 ENCOUNTER — Ambulatory Visit (INDEPENDENT_AMBULATORY_CARE_PROVIDER_SITE_OTHER): Payer: 59 | Admitting: Family Medicine

## 2019-05-15 ENCOUNTER — Encounter (INDEPENDENT_AMBULATORY_CARE_PROVIDER_SITE_OTHER): Payer: Self-pay | Admitting: Family Medicine

## 2019-05-15 ENCOUNTER — Other Ambulatory Visit: Payer: Self-pay

## 2019-05-15 VITALS — BP 130/78 | HR 77 | Temp 98.7°F | Ht 60.0 in | Wt 230.0 lb

## 2019-05-15 DIAGNOSIS — Z6841 Body Mass Index (BMI) 40.0 and over, adult: Secondary | ICD-10-CM

## 2019-05-15 DIAGNOSIS — E559 Vitamin D deficiency, unspecified: Secondary | ICD-10-CM

## 2019-05-15 DIAGNOSIS — E8881 Metabolic syndrome: Secondary | ICD-10-CM

## 2019-05-15 DIAGNOSIS — Z903 Acquired absence of stomach [part of]: Secondary | ICD-10-CM

## 2019-05-15 DIAGNOSIS — Z9189 Other specified personal risk factors, not elsewhere classified: Secondary | ICD-10-CM

## 2019-05-15 NOTE — Progress Notes (Addendum)
Chief Complaint:   OBESITY [name] [name] is here to discuss her progress with her obesity treatment plan along with follow-up of her obesity related diagnoses. [name] [name] is on the Category 1 Plan and states she is following her eating plan approximately 100% of the time. [name] [name] states she is using the treadmill and doing crunches for 60 minutes 6 times per week.  Today's visit was #: 2 Starting weight: 232 lbs Starting date: 05/02/2019 Today's weight: 230 lbs Today's date: 05/15/2019 Total lbs lost to date: 2 lbs Total lbs lost since last in-office visit: 2 lbs  Interim History: Success is working hard to make good decisions.  She says she is struggling to eat 3+ ounces of meat at a meal.  Subjective:   1. Insulin resistance [name] [name] has a diagnosis of insulin resistance based on her elevated fasting insulin level >5. She continues to work on diet and exercise to decrease her risk of diabetes.  Lab Results  Component Value Date   INSULIN 19.9 05/02/2019   Lab Results  Component Value Date   HGBA1C 4.8 05/02/2019   2. Vitamin D deficiency [name] [name]'s Vitamin D level was 30.1 on 05/02/2019. She is not currently taking vit D. She denies nausea, vomiting or muscle weakness.  3. History of sleeve gastrectomy [name] [name] has history of VSG on 10/29/2013 with Dr. Hassell Done.  Assessment/Plan:   1. Insulin resistance [name] [name] will continue to work on weight loss, exercise, and decreasing simple carbohydrates to help decrease the risk of diabetes. [name] [name] agreed to follow-up with [place] as directed to closely monitor her progress.  She will start metformin 500 mg daily.  2. Vitamin D deficiency Low Vitamin D level contributes to fatigue and are associated with obesity, breast, and colon cancer. She agrees to continue to take prescription Vitamin D @50 ,000 IU every week and will follow-up for routine testing of Vitamin D, at least 2-3 times per year to avoid over-replacement.   3.  History of sleeve gastrectomy [name] [name] should continue her current treatment.  4. At risk for diabetes mellitus [name] [name] was given approximately 15 minutes of diabetes education and counseling today. We discussed intensive lifestyle modifications today with an emphasis on weight loss as well as increasing exercise and decreasing simple carbohydrates in her diet. We also reviewed medication options with an emphasis on risk versus benefit of those discussed.   Repetitive spaced learning was employed today to elicit superior memory formation and behavioral change.  5. Class 3 severe obesity with serious comorbidity and body mass index (BMI) of 45.0 to 49.9 in adult, unspecified obesity type (HCC) [name] [name] is currently in the action stage of change. As such, her goal is to continue with weight loss efforts. She has agreed to the Category 1 Plan.   Exercise goals: As is.  Behavioral modification strategies: increasing lean protein intake.  [name] [name] has agreed to follow-up with our clinic in 2 weeks. She was informed of the importance of frequent follow-up visits to maximize her success with intensive lifestyle modifications for her multiple health conditions.   Objective:   Blood pressure 130/78, pulse 77, temperature 98.7 F (37.1 C), temperature source Oral, height 5' (1.524 m), weight 230 lb (104.3 kg), last menstrual period 05/09/2019, SpO2 98 %. Body mass index is 44.92 kg/m.  General: Cooperative, alert, well developed, in no acute distress. HEENT: Conjunctivae and lids unremarkable. Cardiovascular: Regular rhythm.  Lungs: Normal work of breathing. Neurologic: No focal deficits.   Lab Results  Component Value Date   CREATININE  0.90 05/02/2019   BUN 11 05/02/2019   NA 138 05/02/2019   K 4.2 05/02/2019   CL 102 05/02/2019   CO2 23 05/02/2019   Lab Results  Component Value Date   ALT 14 05/02/2019   AST 24 05/02/2019   ALKPHOS 58 05/02/2019   BILITOT 0.3 05/02/2019    Lab Results  Component Value Date   HGBA1C 4.8 05/02/2019   HGBA1C 4.7 04/11/2014   HGBA1C 5.1 05/02/2013   Lab Results  Component Value Date   INSULIN 19.9 05/02/2019   Lab Results  Component Value Date   TSH 1.930 05/02/2019   Lab Results  Component Value Date   CHOL 172 05/02/2019   HDL 61 05/02/2019   LDLCALC 92 05/02/2019   TRIG 109 05/02/2019   Lab Results  Component Value Date   WBC 6.0 05/02/2019   HGB 14.0 05/02/2019   HCT 42.1 05/02/2019   MCV 100 (H) 05/02/2019   PLT 262 05/02/2019   Lab Results  Component Value Date   IRON 101 05/02/2019   TIBC 333 05/02/2019   FERRITIN 71 05/02/2019   Attestation Statements:   Reviewed by clinician on day of visit: allergies, medications, problem list, medical history, surgical history, family history, social history, and previous encounter notes.  I, Water quality scientist, CMA, am acting as Location manager for [employer], DO.  I have reviewed the above documentation for accuracy and completeness, and I agree with the above. Briscoe Deutscher, DO

## 2019-05-16 ENCOUNTER — Ambulatory Visit (INDEPENDENT_AMBULATORY_CARE_PROVIDER_SITE_OTHER): Payer: 59 | Admitting: Family Medicine

## 2019-05-16 ENCOUNTER — Encounter (INDEPENDENT_AMBULATORY_CARE_PROVIDER_SITE_OTHER): Payer: Self-pay | Admitting: Family Medicine

## 2019-05-16 MED ORDER — METFORMIN HCL 500 MG PO TABS: 500.0000 mg | ORAL_TABLET | Freq: Every day | ORAL | 0 refills | Status: DC

## 2019-05-16 MED ORDER — VITAMIN D (ERGOCALCIFEROL) 1.25 MG (50000 UNIT) PO CAPS: 50000.0000 [IU] | ORAL_CAPSULE | ORAL | 0 refills | Status: DC

## 2019-05-29 ENCOUNTER — Other Ambulatory Visit: Payer: Self-pay

## 2019-05-29 ENCOUNTER — Encounter (INDEPENDENT_AMBULATORY_CARE_PROVIDER_SITE_OTHER): Payer: Self-pay | Admitting: Family Medicine

## 2019-05-29 ENCOUNTER — Ambulatory Visit (INDEPENDENT_AMBULATORY_CARE_PROVIDER_SITE_OTHER): Payer: 59 | Admitting: Family Medicine

## 2019-05-29 VITALS — BP 113/82 | HR 87 | Temp 97.9°F | Ht 60.0 in | Wt 225.0 lb

## 2019-05-29 DIAGNOSIS — E559 Vitamin D deficiency, unspecified: Secondary | ICD-10-CM

## 2019-05-29 DIAGNOSIS — Z6841 Body Mass Index (BMI) 40.0 and over, adult: Secondary | ICD-10-CM

## 2019-05-29 DIAGNOSIS — Z903 Acquired absence of stomach [part of]: Secondary | ICD-10-CM | POA: Diagnosis not present

## 2019-05-29 DIAGNOSIS — Z9189 Other specified personal risk factors, not elsewhere classified: Secondary | ICD-10-CM

## 2019-05-29 DIAGNOSIS — E8881 Metabolic syndrome: Secondary | ICD-10-CM

## 2019-05-30 NOTE — Progress Notes (Signed)
Chief Complaint:   OBESITY [name] [name] is here to discuss her progress with her obesity treatment plan along with follow-up of her obesity related diagnoses. [name] [name] is on the Category 1 Plan and states she is following her eating plan approximately 100% of the time. [name] [name] states she is using the treadmill, doing situps, and lifting weights for 30-60 minutes 5-6 times per week.  Today's visit was #: 3 Starting weight: 232 lbs Starting date: 05/02/2019 Today's weight: 225 lbs Today's date: 05/30/2019 Total lbs lost to date: 7 lbs Total lbs lost since last in-office visit: 5 lbs  Interim History: [name] [name] has been having a protein shake in the morning and a sandwich for lunch.  She loves to have [employer] for dinner.  Subjective:   1. Insulin resistance [name] [name] has a diagnosis of insulin resistance based on her elevated fasting insulin level >5. She continues to work on diet and exercise to decrease her risk of diabetes.  She is taking metformin 500 mg daily.  Lab Results  Component Value Date   INSULIN 19.9 05/02/2019   Lab Results  Component Value Date   HGBA1C 4.8 05/02/2019    2. Vitamin D deficiency [name] [name]'s Vitamin D level was 30.1 on 05/02/2019. She is currently taking vit D. She denies nausea, vomiting or muscle weakness.  3. History of sleeve gastrectomy [name] [name] had VSG in 2015 with Dr. Hassell Done.  4. At risk for malnutrition [name] [name] is at increased risk for malnutrition due to her history of bariatric surgery.   Assessment/Plan:   1. Insulin resistance Brione will continue to work on weight loss, exercise, and decreasing simple carbohydrates to help decrease the risk of diabetes. Ikeia agreed to follow-up with [place] as directed to closely monitor her progress.  2. Vitamin D deficiency Low Vitamin D level contributes to fatigue and are associated with obesity, breast, and colon cancer. She agrees to continue to take prescription Vitamin D  @50 ,000 IU every week and will follow-up for routine testing of Vitamin D, at least 2-3 times per year to avoid over-replacement.  3. History of sleeve gastrectomy [name] [name] is at risk for malnutrition due to her previous bariatric surgery.   Counseling  You may need to eat 3 meals and 2 snacks, or 5 small meals each day in order to reach your protein and calorie goals.   Allow at least 15 minutes for each meal so that you can eat mindfully. Listen to your body so that you do not overeat. For most people, your sleeve or pouch will comfortably hold 4-6 ounces.  Eat foods from all food groups. This includes fruits and vegetables, grains, dairy, and meat and other proteins.  Include a protein-rich food at every meal and snack, and eat the protein food first.   You should be taking a Bariatric Multivitamin as well as calcium.   4. At risk for malnutrition [name] [name] is at risk for malnutrition due to her previous bariatric surgery.   Counseling  You may need to eat 3 meals and 2 snacks, or 5 small meals each day in order to reach your protein and calorie goals.   Allow at least 15 minutes for each meal so that you can eat mindfully. Listen to your body so that you do not overeat. For most people, your sleeve or pouch will comfortably hold 4-6 ounces.  Eat foods from all food groups. This includes fruits and vegetables, grains, dairy, and meat and other proteins.  Include a protein-rich food at every  meal and snack, and eat the protein food first.   You should be taking a Bariatric Multivitamin as well as calcium.   5. Class 3 severe obesity with serious comorbidity and body mass index (BMI) of 40.0 to 44.9 in adult, unspecified obesity type ([place]) [name] [name] is currently in the action stage of change. As such, her goal is to continue with weight loss efforts. She has agreed to keeping a food journal and adhering to recommended goals of 1000 calories and 75 grams of protein.   Exercise  goals: As is.  Behavioral modification strategies: increasing lean protein intake.  Markan has agreed to follow-up with our clinic in 2 weeks. She was informed of the importance of frequent follow-up visits to maximize her success with intensive lifestyle modifications for her multiple health conditions.   Objective:   Blood pressure 113/82, pulse 87, temperature 97.9 F (36.6 C), temperature source Oral, height 5' (1.524 m), weight 225 lb (102.1 kg), last menstrual period 05/09/2019, SpO2 98 %. Body mass index is 43.94 kg/m.  General: Cooperative, alert, well developed, in no acute distress. HEENT: Conjunctivae and lids unremarkable. Cardiovascular: Regular rhythm.  Lungs: Normal work of breathing. Neurologic: No focal deficits.   Lab Results  Component Value Date   CREATININE 0.90 05/02/2019   BUN 11 05/02/2019   NA 138 05/02/2019   K 4.2 05/02/2019   CL 102 05/02/2019   CO2 23 05/02/2019   Lab Results  Component Value Date   ALT 14 05/02/2019   AST 24 05/02/2019   ALKPHOS 58 05/02/2019   BILITOT 0.3 05/02/2019   Lab Results  Component Value Date   HGBA1C 4.8 05/02/2019   HGBA1C 4.7 04/11/2014   HGBA1C 5.1 05/02/2013   Lab Results  Component Value Date   INSULIN 19.9 05/02/2019   Lab Results  Component Value Date   TSH 1.930 05/02/2019   Lab Results  Component Value Date   CHOL 172 05/02/2019   HDL 61 05/02/2019   LDLCALC 92 05/02/2019   TRIG 109 05/02/2019   Lab Results  Component Value Date   WBC 6.0 05/02/2019   HGB 14.0 05/02/2019   HCT 42.1 05/02/2019   MCV 100 (H) 05/02/2019   PLT 262 05/02/2019   Lab Results  Component Value Date   IRON 101 05/02/2019   TIBC 333 05/02/2019   FERRITIN 71 05/02/2019   Attestation Statements:   Reviewed by clinician on day of visit: allergies, medications, problem list, medical history, surgical history, family history, social history, and previous encounter notes.  I, Water quality scientist, CMA, am acting as  Location manager for [employer], DO.  I have reviewed the above documentation for accuracy and completeness, and I agree with the above. Briscoe Deutscher, DO

## 2019-06-07 ENCOUNTER — Other Ambulatory Visit (INDEPENDENT_AMBULATORY_CARE_PROVIDER_SITE_OTHER): Payer: Self-pay | Admitting: Family Medicine

## 2019-06-10 MED ORDER — METFORMIN HCL 500 MG PO TABS: 500.0000 mg | ORAL_TABLET | Freq: Every day | ORAL | 0 refills | Status: DC

## 2019-06-10 MED ORDER — VITAMIN D (ERGOCALCIFEROL) 1.25 MG (50000 UNIT) PO CAPS: 50000.0000 [IU] | ORAL_CAPSULE | ORAL | 0 refills | Status: DC

## 2019-06-19 ENCOUNTER — Ambulatory Visit (INDEPENDENT_AMBULATORY_CARE_PROVIDER_SITE_OTHER): Payer: 59 | Admitting: Family Medicine

## 2019-06-19 ENCOUNTER — Other Ambulatory Visit: Payer: Self-pay

## 2019-06-19 ENCOUNTER — Encounter (INDEPENDENT_AMBULATORY_CARE_PROVIDER_SITE_OTHER): Payer: Self-pay | Admitting: Family Medicine

## 2019-06-19 VITALS — HR 82 | Temp 97.8°F | Ht 60.0 in | Wt 220.0 lb

## 2019-06-19 DIAGNOSIS — Z9189 Other specified personal risk factors, not elsewhere classified: Secondary | ICD-10-CM | POA: Diagnosis not present

## 2019-06-19 DIAGNOSIS — Z903 Acquired absence of stomach [part of]: Secondary | ICD-10-CM

## 2019-06-19 DIAGNOSIS — Z6841 Body Mass Index (BMI) 40.0 and over, adult: Secondary | ICD-10-CM

## 2019-06-19 DIAGNOSIS — E559 Vitamin D deficiency, unspecified: Secondary | ICD-10-CM

## 2019-06-19 DIAGNOSIS — E8881 Metabolic syndrome: Secondary | ICD-10-CM

## 2019-06-19 NOTE — Progress Notes (Signed)
Chief Complaint:   OBESITY [name] [name] is here to discuss her progress with her obesity treatment plan along with follow-up of her obesity related diagnoses. [name] [name] is on the Category 1 Plan and states she is following her eating plan approximately 100% of the time. [name] [name] states she is using the treadmill and doing squats for 30-45 minutes 6 times per week.  Today's visit was #: 4 Starting weight: 232 lbs Starting date: 05/02/2019 Today's weight: 220 lbs Today's date: 06/19/2019 Total lbs lost to date: 12 lbs Total lbs lost since last in-office visit: 5 lbs  Interim History: [name] [name] says her sleep has improved.  She is exercising more regularly.  She has been having a protein shake in the morning and a sandwich for lunch.  She loves to have [employer] for dinner.  She likes the Atkins snacks.  Subjective:   1. Vitamin D deficiency [name] [name] Vitamin D level was 30.1 on 05/02/2019. She is currently taking vit D. She denies nausea, vomiting or muscle weakness.  2. Insulin resistance [name] [name] has a diagnosis of insulin resistance based on her elevated fasting insulin level >5. She continues to work on diet and exercise to decrease her risk of diabetes.  She is taking metformin 500 mg daily.  Lab Results  Component Value Date   INSULIN 19.9 05/02/2019   Lab Results  Component Value Date   HGBA1C 4.8 05/02/2019   3. History of sleeve gastrectomy [name] [name] had VSG in 2015, with Dr. Hassell Done.  4. At risk for deficient intake of food The patient is at a higher than average risk of deficient intake of food due to her history of bariatric surgery.  Assessment/Plan:   1. Vitamin D deficiency Low Vitamin D level contributes to fatigue and are associated with obesity, breast, and colon cancer. She agrees to continue to take prescription Vitamin D @50 ,000 IU every week and will follow-up for routine testing of Vitamin D, at least 2-3 times per year to avoid  over-replacement.  2. Insulin resistance [name] [name] will continue to work on weight loss, exercise, and decreasing simple carbohydrates to help decrease the risk of diabetes. [name] [name] agreed to follow-up with [place] as directed to closely monitor her progress.  3. History of sleeve gastrectomy [name] [name] is at risk for malnutrition due to her previous bariatric surgery.   Counseling  You may need to eat 3 meals and 2 snacks, or 5 small meals each day in order to reach your protein and calorie goals.   Allow at least 15 minutes for each meal so that you can eat mindfully. Listen to your body so that you do not overeat. For most people, your sleeve or pouch will comfortably hold 4-6 ounces.  Eat foods from all food groups. This includes fruits and vegetables, grains, dairy, and meat and other proteins.  Include a protein-rich food at every meal and snack, and eat the protein food first.   You should be taking a Bariatric Multivitamin as well as calcium.   4. At risk for deficient intake of food [name] [name] was given approximately 15 minutes of deficit intake of food prevention counseling today. [name] [name] is at risk for eating too few calories based on current food recall. She was encouraged to focus on meeting caloric and protein goals according to her recommended meal plan.   5. Class 3 severe obesity with serious comorbidity and body mass index (BMI) of 40.0 to 44.9 in adult, unspecified obesity type ([facility]) [name] [name] is currently in the action stage  of change. As such, her goal is to continue with weight loss efforts. She has agreed to the Category 1 Plan.   Exercise goals: As is.  Behavioral modification strategies: increasing lean protein intake.  [name] [name] has agreed to follow-up with our clinic in 2 weeks. She was informed of the importance of frequent follow-up visits to maximize her success with intensive lifestyle modifications for her multiple health conditions.   Objective:   Pulse  82, temperature 97.8 F (36.6 C), temperature source Oral, height 5' (1.524 m), weight 220 lb (99.8 kg), last menstrual period 06/14/2019, SpO2 97 %. Body mass index is 42.97 kg/m.  General: Cooperative, alert, well developed, in no acute distress. HEENT: Conjunctivae and lids unremarkable. Cardiovascular: Regular rhythm.  Lungs: Normal work of breathing. Neurologic: No focal deficits.   Lab Results  Component Value Date   CREATININE 0.90 05/02/2019   BUN 11 05/02/2019   NA 138 05/02/2019   K 4.2 05/02/2019   CL 102 05/02/2019   CO2 23 05/02/2019   Lab Results  Component Value Date   ALT 14 05/02/2019   AST 24 05/02/2019   ALKPHOS 58 05/02/2019   BILITOT 0.3 05/02/2019   Lab Results  Component Value Date   HGBA1C 4.8 05/02/2019   HGBA1C 4.7 04/11/2014   HGBA1C 5.1 05/02/2013   Lab Results  Component Value Date   INSULIN 19.9 05/02/2019   Lab Results  Component Value Date   TSH 1.930 05/02/2019   Lab Results  Component Value Date   CHOL 172 05/02/2019   HDL 61 05/02/2019   LDLCALC 92 05/02/2019   TRIG 109 05/02/2019   Lab Results  Component Value Date   WBC 6.0 05/02/2019   HGB 14.0 05/02/2019   HCT 42.1 05/02/2019   MCV 100 (H) 05/02/2019   PLT 262 05/02/2019   Lab Results  Component Value Date   IRON 101 05/02/2019   TIBC 333 05/02/2019   FERRITIN 71 05/02/2019   Attestation Statements:   Reviewed by clinician on day of visit: allergies, medications, problem list, medical history, surgical history, family history, social history, and previous encounter notes.  I, Water quality scientist, CMA, am acting as Location manager for [employer], DO.  I have reviewed the above documentation for accuracy and completeness, and I agree with the above. Briscoe Deutscher, DO

## 2019-07-03 ENCOUNTER — Other Ambulatory Visit (INDEPENDENT_AMBULATORY_CARE_PROVIDER_SITE_OTHER): Payer: Self-pay | Admitting: Family Medicine

## 2019-07-09 ENCOUNTER — Encounter (INDEPENDENT_AMBULATORY_CARE_PROVIDER_SITE_OTHER): Payer: Self-pay | Admitting: Family Medicine

## 2019-07-09 ENCOUNTER — Other Ambulatory Visit: Payer: Self-pay

## 2019-07-09 ENCOUNTER — Ambulatory Visit (INDEPENDENT_AMBULATORY_CARE_PROVIDER_SITE_OTHER): Payer: 59 | Admitting: Family Medicine

## 2019-07-09 VITALS — BP 116/78 | HR 78 | Temp 98.5°F | Ht 60.0 in | Wt 218.0 lb

## 2019-07-09 DIAGNOSIS — E559 Vitamin D deficiency, unspecified: Secondary | ICD-10-CM | POA: Diagnosis not present

## 2019-07-09 DIAGNOSIS — N951 Menopausal and female climacteric states: Secondary | ICD-10-CM | POA: Diagnosis not present

## 2019-07-09 DIAGNOSIS — Z6841 Body Mass Index (BMI) 40.0 and over, adult: Secondary | ICD-10-CM

## 2019-07-09 DIAGNOSIS — Z9189 Other specified personal risk factors, not elsewhere classified: Secondary | ICD-10-CM | POA: Diagnosis not present

## 2019-07-09 DIAGNOSIS — E8881 Metabolic syndrome: Secondary | ICD-10-CM | POA: Diagnosis not present

## 2019-07-09 DIAGNOSIS — Z903 Acquired absence of stomach [part of]: Secondary | ICD-10-CM | POA: Diagnosis not present

## 2019-07-09 MED ORDER — VITAMIN D (ERGOCALCIFEROL) 1.25 MG (50000 UNIT) PO CAPS: 50000.0000 [IU] | ORAL_CAPSULE | ORAL | 0 refills | Status: DC

## 2019-07-09 MED ORDER — METFORMIN HCL 500 MG PO TABS: 500.0000 mg | ORAL_TABLET | Freq: Every day | ORAL | 0 refills | Status: DC

## 2019-07-09 NOTE — Progress Notes (Signed)
Chief Complaint:   OBESITY [name] [name] is here to discuss her progress with her obesity treatment plan along with follow-up of her obesity related diagnoses. [name] [name] is on the Category 1 Plan and states she is following her eating plan approximately 80% of the time. [name] [name] states she is walking for 30 minutes 3-4 times per week.  Today's visit was #: 5 Starting weight: 232 lbs Starting date: 05/02/2019 Today's weight: 218 lbs Today's date: 07/09/2019 Total lbs lost to date: 14 lbs Total lbs lost since last in-office visit: 2 lbs  Interim History: [name] [name] has not been drinking tea and has instead been drinking water.  She snacks on P3, Balance Breaks, [employer] to get more protein.  She has had no constipation.  She says her schedule has her "off balance".  Subjective:   1. Insulin resistance Zyonna has a diagnosis of insulin resistance based on her elevated fasting insulin level >5. She continues to work on diet and exercise to decrease her risk of diabetes.  Lab Results  Component Value Date   INSULIN 19.9 05/02/2019   Lab Results  Component Value Date   HGBA1C 4.8 05/02/2019   2. Vitamin D deficiency [name] [name]'s Vitamin D level was 30.1 on 05/02/2019. She is currently taking prescription vitamin D 50,000 IU each week. She denies nausea, vomiting or muscle weakness.  3. History of sleeve gastrectomy [name] [name] had VSG in 2015.  4. Perimenopausal vasomotor symptoms [name] [name] is having moderate hot flashes due to perimenopause.  5. At risk for osteoporosis [name] [name] is at higher risk of osteopenia and osteoporosis due to Vitamin D deficiency.   Assessment/Plan:   1. Insulin resistance [name] [name] will continue to work on weight loss, exercise, and decreasing simple carbohydrates to help decrease the risk of diabetes. [name] [name] agreed to follow-up with [place] as directed to closely monitor her progress.  Orders - metFORMIN (GLUCOPHAGE) 500 MG tablet; Take 1 tablet  (500 mg total) by mouth daily.  Dispense: 30 tablet; Refill: 0  2. Vitamin D deficiency Low Vitamin D level contributes to fatigue and are associated with obesity, breast, and colon cancer. She agrees to continue to take prescription Vitamin D @50 ,000 IU every week and will follow-up for routine testing of Vitamin D, at least 2-3 times per year to avoid over-replacement.  Orders - Vitamin D, Ergocalciferol, (DRISDOL) 1.25 MG (50000 UNIT) CAPS capsule; Take 1 capsule (50,000 Units total) by mouth every 7 (seven) days.  Dispense: 4 capsule; Refill: 0  3. History of sleeve gastrectomy [name] [name] is at risk for malnutrition due to her previous bariatric surgery.   Counseling  You may need to eat 3 meals and 2 snacks, or 5 small meals each day in order to reach your protein and calorie goals.   Allow at least 15 minutes for each meal so that you can eat mindfully. Listen to your body so that you do not overeat. For most people, your sleeve or pouch will comfortably hold 4-6 ounces.  Eat foods from all food groups. This includes fruits and vegetables, grains, dairy, and meat and other proteins.  Include a protein-rich food at every meal and snack, and eat the protein food first.   You should be taking a Bariatric Multivitamin as well as calcium.   4. Perimenopausal vasomotor symptoms Will refer to [name] [name] in GYN, as below.  Orders - Ambulatory referral to Gynecology  5. At risk for osteoporosis [name] [name] was given approximately 15 minutes of osteoporosis prevention counseling today. [name] [name] is  at risk for osteopenia and osteoporosis due to her Vitamin D deficiency. She was encouraged to take her Vitamin D and follow her higher calcium diet and increase strengthening exercise to help strengthen her bones and decrease her risk of osteopenia and osteoporosis.  Repetitive spaced learning was employed today to elicit superior memory formation and behavioral change.  6. Class 3 severe  obesity with serious comorbidity and body mass index (BMI) of 40.0 to 44.9 in adult, unspecified obesity type ([name] [name]) [name] [name] is currently in the action stage of change. As such, her goal is to continue with weight loss efforts. She has agreed to the Category 1 Plan.   Exercise goals: As is.  Increase walking in the evening.  Behavioral modification strategies: increasing lean protein intake and increasing water intake.  [name] [name] has agreed to follow-up with our clinic in 2 weeks. She was informed of the importance of frequent follow-up visits to maximize her success with intensive lifestyle modifications for her multiple health conditions.   Objective:   Blood pressure 116/78, pulse 78, temperature 98.5 F (36.9 C), temperature source Oral, height 5' (1.524 m), weight 218 lb (98.9 kg), last menstrual period 06/14/2019, SpO2 98 %. Body mass index is 42.58 kg/m.  General: Cooperative, alert, well developed, in no acute distress. HEENT: Conjunctivae and lids unremarkable. Cardiovascular: Regular rhythm.  Lungs: Normal work of breathing. Neurologic: No focal deficits.   Lab Results  Component Value Date   CREATININE 0.90 05/02/2019   BUN 11 05/02/2019   NA 138 05/02/2019   K 4.2 05/02/2019   CL 102 05/02/2019   CO2 23 05/02/2019   Lab Results  Component Value Date   ALT 14 05/02/2019   AST 24 05/02/2019   ALKPHOS 58 05/02/2019   BILITOT 0.3 05/02/2019   Lab Results  Component Value Date   HGBA1C 4.8 05/02/2019   HGBA1C 4.7 04/11/2014   HGBA1C 5.1 05/02/2013   Lab Results  Component Value Date   INSULIN 19.9 05/02/2019   Lab Results  Component Value Date   TSH 1.930 05/02/2019   Lab Results  Component Value Date   CHOL 172 05/02/2019   HDL 61 05/02/2019   LDLCALC 92 05/02/2019   TRIG 109 05/02/2019   Lab Results  Component Value Date   WBC 6.0 05/02/2019   HGB 14.0 05/02/2019   HCT 42.1 05/02/2019   MCV 100 (H) 05/02/2019   PLT 262 05/02/2019   Lab  Results  Component Value Date   IRON 101 05/02/2019   TIBC 333 05/02/2019   FERRITIN 71 05/02/2019   Attestation Statements:   Reviewed by clinician on day of visit: allergies, medications, problem list, medical history, surgical history, family history, social history, and previous encounter notes.  I, Water quality scientist, CMA, am acting as Location manager for [employer], DO.  I have reviewed the above documentation for accuracy and completeness, and I agree with the above. Briscoe Deutscher, DO

## 2019-07-22 ENCOUNTER — Other Ambulatory Visit: Payer: Self-pay

## 2019-07-23 ENCOUNTER — Ambulatory Visit: Payer: 59 | Admitting: Obstetrics and Gynecology

## 2019-07-23 ENCOUNTER — Encounter: Payer: Self-pay | Admitting: Obstetrics and Gynecology

## 2019-07-23 VITALS — BP 122/68 | HR 96 | Temp 98.2°F | Ht 60.0 in | Wt 220.0 lb

## 2019-07-23 DIAGNOSIS — R232 Flushing: Secondary | ICD-10-CM

## 2019-07-23 DIAGNOSIS — N951 Menopausal and female climacteric states: Secondary | ICD-10-CM | POA: Diagnosis not present

## 2019-07-23 DIAGNOSIS — R61 Generalized hyperhidrosis: Secondary | ICD-10-CM | POA: Diagnosis not present

## 2019-07-23 DIAGNOSIS — N920 Excessive and frequent menstruation with regular cycle: Secondary | ICD-10-CM | POA: Diagnosis not present

## 2019-07-23 MED ORDER — ETONOGESTREL-ETHINYL ESTRADIOL 0.12-0.015 MG/24HR VA RING: 1.0000 | VAGINAL_RING | VAGINAL | 0 refills | Status: DC

## 2019-07-23 NOTE — Patient Instructions (Signed)
Ethinyl Estradiol; Etonogestrel vaginal ring What is this medicine? ETHINYL ESTRADIOL; ETONOGESTREL (ETH in il es tra DYE ole; et oh noe JES trel) vaginal ring is a flexible, vaginal ring used as a contraceptive (birth control method). This medicine combines 2 types of female hormones, an estrogen and a progestin. This ring is used to prevent ovulation and pregnancy. Each ring is effective for 1 month. This medicine may be used for other purposes; ask your health care provider or pharmacist if you have questions. COMMON BRAND NAME(S): EluRyng, NuvaRing What should I tell my health care provider before I take this medicine? They need to know if you have any of these conditions:  abnormal vaginal bleeding  blood vessel disease or blood clots  breast, cervical, endometrial, ovarian, liver, or uterine cancer  diabetes  gallbladder disease  having surgery  heart disease or recent heart attack  high blood pressure  high cholesterol or triglycerides  history of irregular heartbeat or heart valve problems  kidney disease  liver disease  migraine headaches  protein C deficiency  protein S deficiency  recently had a baby, miscarriage, or abortion  stroke  systemic lupus erythematosus (SLE)  tobacco smoker  your age is more than 45 years old  an unusual or allergic reaction to estrogens, progestins, other medicines, foods, dyes, or preservatives  pregnant or trying to get pregnant  breast-feeding How should I use this medicine? Insert the ring into your vagina as directed. Follow the directions on the prescription label. The ring will remain place for 3 weeks and is then removed for a 1-week break. A new ring is inserted 1 week after the last ring was removed, on the same day of the week. Check often to make sure the ring is still in place. If the ring was out of the vagina for an unknown amount of time, you may not be protected from pregnancy. Perform a pregnancy test and  call your doctor. Do not use more often than directed. A patient package insert for the product will be given with each prescription and refill. Read this sheet carefully each time. The sheet may change frequently. Contact your pediatrician regarding the use of this medicine in children. Special care may be needed. Overdosage: If you think you have taken too much of this medicine contact a poison control center or emergency room at once. NOTE: This medicine is only for you. Do not share this medicine with others. What if I miss a dose? You will need to use the ring exactly as directed. It is very important to follow the schedule every cycle. If you do not use the ring as directed, you may not be protected from pregnancy. If the ring should slip out, is lost, or if you leave it in longer or shorter than you should, contact your health care professional for advice. What may interact with this medicine? Do not take this medicine with the following medications:  dasabuvir; ombitasvir; paritaprevir; ritonavir  ombitasvir; paritaprevir; ritonavir  vaginal lubricants or other vaginal products that are oil-based or silicone-based This medicine may also interact with the following medications:  acetaminophen  antibiotics or medicines for infections, especially rifampin, rifabutin, rifapentine, and griseofulvin, and possibly penicillins or tetracyclines  aprepitant or fosaprepitant  armodafinil  ascorbic acid (vitamin C)  barbiturate medicines, such as phenobarbital or primidone  bosentan  certain antiviral medicines for hepatitis, HIV or AIDS  certain medicines for cancer treatment  certain medicines for seizures like carbamazepine, clobazam, felbamate, lamotrigine, oxcarbazepine, phenytoin,   rufinamide, topiramate  certain medicines for treating high cholesterol  cyclosporine  dantrolene  elagolix  flibanserin  grapefruit juice  lesinurad  medicines for diabetes  medicines  to treat fungal infections, such as griseofulvin, miconazole, fluconazole, ketoconazole, itraconazole, posaconazole or voriconazole  mifepristone  mitotane  modafinil  morphine  mycophenolate  St. John's wort  tamoxifen  temazepam  theophylline or aminophylline  thyroid hormones  tizanidine  tranexamic acid  ulipristal  warfarin This list may not describe all possible interactions. Give your health care provider a list of all the medicines, herbs, non-prescription drugs, or dietary supplements you use. Also tell them if you smoke, drink alcohol, or use illegal drugs. Some items may interact with your medicine. What should I watch for while using this medicine? Visit your doctor or health care professional for regular checks on your progress. You will need a regular breast and pelvic exam and Pap smear while on this medicine. Check with your doctor or health care professional to see if you need an additional method of contraception during the first cycle that you use this ring. Female condoms (made with natural rubber latex, polyisoprene, and polyurethane) and spermicides may be used. Do not use a diaphragm, cervical cap, or a female condom, as the ring can interfere with these birth control methods and their proper placement. If you have any reason to think you are pregnant, stop using this medicine right away and contact your doctor or health care professional. If you are using this medicine for hormone related problems, it may take several cycles of use to see improvement in your condition. Smoking increases the risk of getting a blood clot or having a stroke while you are using hormonal birth control, especially if you are more than 45 years old. You are strongly advised not to smoke. Some women are prone to getting dark patches on the skin of the face (cholasma). Your risk of getting chloasma with this medicine is higher if you had chloasma during a pregnancy. Keep out of the  sun. If you cannot avoid being in the sun, wear protective clothing and use sunscreen. Do not use sun lamps or tanning beds/booths. This medicine can make your body retain fluid, making your fingers, hands, or ankles swell. Your blood pressure can go up. Contact your doctor or health care professional if you feel you are retaining fluid. If you are going to have elective surgery, you may need to stop using this medicine before the surgery. Consult your health care professional for advice. This medicine does not protect you against HIV infection (AIDS) or any other sexually transmitted diseases. What side effects may I notice from receiving this medicine? Side effects that you should report to your doctor or health care professional as soon as possible:  allergic reactions such as skin rash or itching, hives, swelling of the lips, mouth, tongue, or throat  depression  high blood pressure  migraines or severe, sudden headaches  signs and symptoms of a blood clot such as breathing problems; changes in vision; chest pain; severe, sudden headache; pain, swelling, warmth in the leg; trouble speaking; sudden numbness or weakness of the face, arm or leg  signs and symptoms of infection like fever or chills with dizziness and a sunburn-like rash, or pain or trouble passing urine  stomach pain  symptoms of vaginal infection like itching, irritation or unusual discharge  yellowing of the eyes or skin Side effects that usually do not require medical attention (report these to your doctor  or health care professional if they continue or are bothersome):  acne  breast pain, tenderness  irregular vaginal bleeding or spotting, particularly during the first month of use  mild headache  nausea  painful periods  vomiting This list may not describe all possible side effects. Call your doctor for medical advice about side effects. You may report side effects to FDA at 1-800-FDA-1088. Where should I  keep my medicine? Keep out of the reach of children. Store unopened medicine for up to 4 months at room temperature at 15 and 30 degrees C (59 and 86 degrees F). Protect from light. Do not store above 30 degrees C (86 degrees F). Throw away any unused medicine 4 months after the dispense date or the expiration date, whichever comes first. A ring may only be used for 1 cycle (1 month). After the 3-week cycle, a used ring is removed and should be placed in the re-closable foil pouch and discarded in the trash out of reach of children and pets. Do NOT flush down the toilet. NOTE: This sheet is a summary. It may not cover all possible information. If you have questions about this medicine, talk to your doctor, pharmacist, or health care provider.  2020 Elsevier/Gold Standard (2018-10-11 12:31:47) Perimenopause  Perimenopause is the normal time of life before and after menstrual periods stop completely (menopause). Perimenopause can begin 2-8 years before menopause, and it usually lasts for 1 year after menopause. During perimenopause, the ovaries may or may not produce an egg. What are the causes? This condition is caused by a natural change in hormone levels that happens as you get older. What increases the risk? This condition is more likely to start at an earlier age if you have certain medical conditions or treatments, including:  A tumor of the pituitary gland in the brain.  A disease that affects the ovaries and hormone production.  Radiation treatment for cancer.  Certain cancer treatments, such as chemotherapy or hormone (anti-estrogen) therapy.  Heavy smoking and excessive alcohol use.  Family history of early menopause. What are the signs or symptoms? Perimenopausal changes affect each woman differently. Symptoms of this condition may include:  Hot flashes.  Night sweats.  Irregular menstrual periods.  Decreased sex drive.  Vaginal dryness.  Headaches.  Mood  swings.  Depression.  Memory problems or trouble concentrating.  Irritability.  Tiredness.  Weight gain.  Anxiety.  Trouble getting pregnant. How is this diagnosed? This condition is diagnosed based on your medical history, a physical exam, your age, your menstrual history, and your symptoms. Hormone tests may also be done. How is this treated? In some cases, no treatment is needed. You and your health care provider should make a decision together about whether treatment is necessary. Treatment will be based on your individual condition and preferences. Various treatments are available, such as:  Menopausal hormone therapy (MHT).  Medicines to treat specific symptoms.  Acupuncture.  Vitamin or herbal supplements. Before starting treatment, make sure to let your health care provider know if you have a personal or family history of:  Heart disease.  Breast cancer.  Blood clots.  Diabetes.  Osteoporosis. Follow these instructions at home: Lifestyle  Do not use any products that contain nicotine or tobacco, such as cigarettes and e-cigarettes. If you need help quitting, ask your health care provider.  Eat a balanced diet that includes fresh fruits and vegetables, whole grains, soybeans, eggs, lean meat, and low-fat dairy.  Get at least 30 minutes of physical  activity on 5 or more days each week.  Avoid alcoholic and caffeinated beverages, as well as spicy foods. This may help prevent hot flashes.  Get 7-8 hours of sleep each night.  Dress in layers that can be removed to help you manage hot flashes.  Find ways to manage stress, such as deep breathing, meditation, or journaling. General instructions  Keep track of your menstrual periods, including: ? When they occur. ? How heavy they are and how long they last. ? How much time passes between periods.  Keep track of your symptoms, noting when they start, how often you have them, and how long they last.  Take  over-the-counter and prescription medicines only as told by your health care provider.  Take vitamin supplements only as told by your health care provider. These may include calcium, vitamin E, and vitamin D.  Use vaginal lubricants or moisturizers to help with vaginal dryness and improve comfort during sex.  Talk with your health care provider before starting any herbal supplements.  Keep all follow-up visits as told by your health care provider. This is important. This includes any group therapy or counseling. Contact a health care provider if:  You have heavy vaginal bleeding or pass blood clots.  Your period lasts more than 2 days longer than normal.  Your periods are recurring sooner than 21 days.  You bleed after having sex. Get help right away if:  You have chest pain, trouble breathing, or trouble talking.  You have severe depression.  You have pain when you urinate.  You have severe headaches.  You have vision problems. Summary  Perimenopause is the time when a woman's body begins to move into menopause. This may happen naturally or as a result of other health problems or medical treatments.  Perimenopause can begin 2-8 years before menopause, and it usually lasts for 1 year after menopause.  Perimenopausal symptoms can be managed through medicines, lifestyle changes, and complementary therapies such as acupuncture. This information is not intended to replace advice given to you by your health care provider. Make sure you discuss any questions you have with your health care provider. Document Revised: 03/03/2017 Document Reviewed: 04/26/2016 Elsevier Patient Education  2020 [employer].

## 2019-07-23 NOTE — Progress Notes (Signed)
44 y.o. [id number] Single White or Caucasian Not Hispanic or Latino female here for a consultation from Dr Juleen China secondary to vasomotor symptoms.  She is having hot flashes and night sweats.   Cycles are roughly every 17-20 days. Bleeds x 3-5 days, not heavy, could wear a pad all day. Cramps vary, minimal to moderate. No BTB.  Sexually active, same partner x 11 years. No dyspareunia. Tubal and vasectomy for contraception.   She c/o a 1 year h/o vasomotor symptoms. Night sweats are worse than the hot flashes. Occurs ~ 4 nights a week, up 1 x a night, can be drenched when she wakes up. She has had about 5 hot flashes, can be bad.  Overall sleeping okay. No vaginal dryness, some mood changes.   She is on metformin for insulin resistance, normal HgbA1C  She reports recurrent UTI's, thinks they occur with intercourse. 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 She went from 297 down to 170  Patient's last menstrual period was 07/09/2019 (exact date).          Sexually active: Yes.    The current method of family planning is tubal ligation.    Exercising: Yes.    walking and weights Smoker:  no  Health Maintenance: Pap:  Jan 2021 normal   History of abnormal Pap:  no MMG:  07/21/14 Bi-rads 1 neg  BMD:   NA Colonoscopy: no  TDaP:  02/11/15 Gardasil: NA   reports that she has never smoked. She has never used smokeless tobacco. She reports current alcohol use. She reports that she does not use drugs. No ETOH. She is a Librarian, academic for [employer].  Kids are 25 and 16. Has a 32 year old grandson.  Past Medical History:  Diagnosis Date  . Allergy   . Arthritis    . Asthma   . Back pain   . Edema, lower extremity   . Food allergy   . Joint pain   . Migraine without aura    migraine  . Obesity   . Plantar fasciitis   . PONV (postoperative nausea and vomiting)   . Ruptured disc, cervical   . Shortness of breath   . Sleep apnea 08-16-13    mild no cpap needed per sleep study results    Past Surgical History:  Procedure Laterality Date  . FOOT NEUROMA SURGERY Right 2013  . KNEE ARTHROSCOPY Left 9-10 yrs ago  . 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 left foot surgery    . SHOULDER SURGERY    . TOE SURGERY  03/22/2018   cyst off of left toe bone removed  . TUBAL LIGATION  2004  . UPPER GI ENDOSCOPY  10/29/2013   Procedure: UPPER GI ENDOSCOPY;  Surgeon: [name], MD;  Location: [facility];  Service: General;;    Current Outpatient Medications  Medication Sig Dispense Refill  . albuterol (PROVENTIL) (2.5 MG/3ML) 0.083% nebulizer solution Take 3 mLs (2.5 mg total) by nebulization every 6 (six) hours as needed for wheezing or shortness of breath.  150 mL 1  . albuterol (VENTOLIN HFA) 108 (90 Base) MCG/ACT inhaler Inhale 2 puffs into the lungs every 6 (six) hours as needed for wheezing or shortness of breath. 18 g 0  . Biotin 1000 MCG CHEW Chew by mouth.    . calcium citrate-vitamin D (CITRACAL+D) 315-200 MG-UNIT tablet Take by mouth.    . cyclobenzaprine (FLEXERIL) 10 MG tablet TAKE 1 TABLET (10 MG TOTAL) BY MOUTH TWO (2) TIMES A DAY AS NEEDED FOR MUSCLE SPASMS.  0  . Fluocinonide 0.1 % CREA Apply topically daily as needed.    . metFORMIN (GLUCOPHAGE) 500 MG tablet Take 1 tablet (500 mg total) by mouth daily. 30 tablet 0  . Multiple Vitamin (MULTIVITAMIN) capsule Take by mouth.    . Vitamin D, Ergocalciferol, (DRISDOL) 1.25 MG (50000 UNIT) CAPS capsule Take 1  capsule (50,000 Units total) by mouth every 7 (seven) days. 4 capsule 0   Current Facility-Administered Medications  Medication Dose Route Frequency Provider Last Rate Last Admin  . albuterol (PROVENTIL) (2.5 MG/3ML) 0.083% nebulizer solution 2.5 mg  2.5 mg Nebulization Once Le, Thao P, DO        Family History  Problem Relation Age of Onset  . Lung disease Father   . Pulmonary embolism Father   . Diabetes Father   . Depression Father   . Anxiety disorder Father   . Thyroid disease Mother   . COPD Other   . Diabetes Other    Dad had a PE, h/o COPD. No clotting disorder  Review of Systems  Endocrine: Positive for cold intolerance and heat intolerance.  All other systems reviewed and are negative.   Exam:   BP 122/68   Pulse 96   Temp 98.2 F (36.8 C)   Ht 5' (1.524 m)   Wt 220 lb (99.8 kg)   LMP 07/09/2019 (Exact Date)   SpO2 98%   BMI 42.97 kg/m   Weight change: @WEIGHTCHANGE @ Height:   Height: 5' (152.4 cm)  Ht Readings from Last 3 Encounters:  07/23/19 5' (1.524 m)  07/09/19 5' (1.524 m)  06/19/19 5' (1.524 m)    General appearance: alert, cooperative and appears stated age Head: Normocephalic, without obvious abnormality, atraumatic Neck: no adenopathy, supple, symmetrical, trachea midline and thyroid normal to inspection and palpation Lungs: clear to auscultation bilaterally Cardiovascular: regular rate and rhythm Abdomen: soft, non-tender; non distended,  no masses,  no organomegaly Extremities: extremities normal, atraumatic, no cyanosis or edema Skin: Skin color, texture, turgor normal. No rashes or lesions Lymph nodes: Cervical, supraclavicular, and axillary nodes normal. No abnormal inguinal nodes palpated Neurologic: Grossly normal   Labs from 05/02/19 reviewed, including TSH, vit d, lipid panel, CBC, CMP, HgbA1C Reviewed urine cultures from 05/10/19 (e coli), and 02/22/19 (negative)  A:  Perimenopausal  Vasomotor symptoms  Polymenorrhea, recent  normal TSH, not anemic   H/O gastric sleeve  C/O recurrent UTI, one documented in the last year. Call with symptoms. Discussed voiding after intercourse, wiping front to back and hydrating well  P:   Reviewed chart, no pap seen  Start the nuvaring, no contraindications. Prefer non oral delivery method given her h/o a gastric sleeve.   Calendar all bleeding, if abnormal bleeding persists will need to further evaluate.   F/U in 3 months for f/u and annual exam, will do a pap at that visit  She is overdue for a mammogram and will schedule  CC: Dr Juleen China Note sent

## 2019-07-25 ENCOUNTER — Other Ambulatory Visit: Payer: Self-pay

## 2019-07-25 ENCOUNTER — Ambulatory Visit (INDEPENDENT_AMBULATORY_CARE_PROVIDER_SITE_OTHER): Payer: 59 | Admitting: Family Medicine

## 2019-07-25 ENCOUNTER — Encounter (INDEPENDENT_AMBULATORY_CARE_PROVIDER_SITE_OTHER): Payer: Self-pay | Admitting: Family Medicine

## 2019-07-25 VITALS — BP 114/79 | HR 79 | Temp 97.8°F | Ht 60.0 in | Wt 216.0 lb

## 2019-07-25 DIAGNOSIS — Z9189 Other specified personal risk factors, not elsewhere classified: Secondary | ICD-10-CM | POA: Diagnosis not present

## 2019-07-25 DIAGNOSIS — Z6841 Body Mass Index (BMI) 40.0 and over, adult: Secondary | ICD-10-CM

## 2019-07-25 DIAGNOSIS — E559 Vitamin D deficiency, unspecified: Secondary | ICD-10-CM | POA: Diagnosis not present

## 2019-07-25 DIAGNOSIS — Z903 Acquired absence of stomach [part of]: Secondary | ICD-10-CM

## 2019-07-29 ENCOUNTER — Encounter (INDEPENDENT_AMBULATORY_CARE_PROVIDER_SITE_OTHER): Payer: Self-pay | Admitting: Family Medicine

## 2019-07-29 DIAGNOSIS — E559 Vitamin D deficiency, unspecified: Secondary | ICD-10-CM | POA: Insufficient documentation

## 2019-07-29 MED ORDER — VITAMIN D (ERGOCALCIFEROL) 1.25 MG (50000 UNIT) PO CAPS: 50000.0000 [IU] | ORAL_CAPSULE | ORAL | 0 refills | Status: DC

## 2019-07-29 NOTE — Progress Notes (Signed)
Chief Complaint:   OBESITY [name] [name] is here to discuss her progress with her obesity treatment plan along with follow-up of her obesity related diagnoses. [name] [name] is on the Category 1 Plan and states she is following her eating plan approximately 90% of the time. [name] [name] states she is walking on the treadmill and walking outside 30 minutes 3-5 times per week.  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 She has not had sweet tea since starting on the program, which is a huge victory for her. She is supplementing protein intake with Balanced Breaks and shakes.  Subjective:   History of sleeve gastrectomy. [name] [name] still has restriction in portions. She takes a multivitamin daily. She follows up with Dr. Hassell Done yearly.  Vitamin D deficiency. Last Vitamin D 30.1 on 05/02/2019. [name] [name] is on weekly prescription Vitamin D.  At risk for deficient intake of food.The patient is at a higher than average risk of deficient intake of food due to history of sleeve gastrectomy and not being able to eat normal size portions.  Assessment/Plan:   History of sleeve gastrectomy. [name] [name] will continue her multivitamin and will continue to follow-up with  [facility].  Vitamin D deficiency. Low Vitamin D level contributes to fatigue and are associated with obesity, breast, and colon cancer. She was given a refill on her Vitamin D, Ergocalciferol, (DRISDOL) 1.25 MG (50000 UNIT) CAPS capsule every week #4 with 0 refills and will follow-up for routine testing of Vitamin D at her next visit.   At risk for deficient intake of food. [name] [name] was given approximately 15  minutes of deficit intake of food prevention counseling today. [name] [name] is at risk for eating too few calories based on current food recall. She was encouraged to focus on meeting caloric and protein goals according to her recommended meal plan.   Class 3 severe obesity with serious comorbidity and body mass index (BMI) of 40.0 to 44.9 in adult, unspecified obesity type ([place]).  [name] [name] is currently in the action stage of change. As such, her goal is to continue with weight loss efforts. She has agreed to the Category 1 Plan.   Handout was given on Recipes.  Exercise goals: [name] [name] will continue her current exercise regimen.  Behavioral modification strategies: increasing lean protein intake and planning for success.  [name] [name] has agreed to follow-up with our clinic in 2 weeks. She was informed of the importance of frequent follow-up visits to maximize her success with intensive lifestyle modifications for her multiple health conditions.   Objective:   Blood pressure 114/79, pulse 79, temperature 97.8 F (36.6 C), temperature source Oral, height 5' (1.524 m), weight 216 lb (98 kg), last menstrual period 07/09/2019, SpO2 98 %. Body mass index is 42.18 kg/m.  General: Cooperative, alert, well developed, in no acute distress. HEENT: Conjunctivae and lids unremarkable. Cardiovascular: Regular rhythm.  Lungs: Normal work of breathing. Neurologic: No focal deficits.   Lab Results  Component Value Date   CREATININE 0.90 05/02/2019   BUN 11 05/02/2019   NA 138 05/02/2019   K 4.2 05/02/2019   CL 102 05/02/2019   CO2 23 05/02/2019   Lab Results  Component Value Date  ALT 14 05/02/2019   AST 24 05/02/2019   ALKPHOS 58 05/02/2019   BILITOT 0.3 05/02/2019   Lab Results  Component Value Date   HGBA1C 4.8 05/02/2019   HGBA1C 4.7 04/11/2014   HGBA1C 5.1 05/02/2013   Lab Results  Component Value Date   INSULIN 19.9 05/02/2019   Lab Results  Component Value Date   TSH 1.930  05/02/2019   Lab Results  Component Value Date   CHOL 172 05/02/2019   HDL 61 05/02/2019   LDLCALC 92 05/02/2019   TRIG 109 05/02/2019   Lab Results  Component Value Date   WBC 6.0 05/02/2019   HGB 14.0 05/02/2019   HCT 42.1 05/02/2019   MCV 100 (H) 05/02/2019   PLT 262 05/02/2019   Lab Results  Component Value Date   IRON 101 05/02/2019   TIBC 333 05/02/2019   FERRITIN 71 05/02/2019   Attestation Statements:   Reviewed by clinician on day of visit: allergies, medications, problem list, medical history, surgical history, family history, social history, and previous encounter notes.  IMichaelene Song, am acting as Location manager for [employer], FNP   I have reviewed the above documentation for accuracy and completeness, and I agree with the above. -  Georgianne Fick, FNP

## 2019-08-13 ENCOUNTER — Ambulatory Visit (INDEPENDENT_AMBULATORY_CARE_PROVIDER_SITE_OTHER): Payer: 59 | Admitting: Family Medicine

## 2019-08-13 ENCOUNTER — Encounter (INDEPENDENT_AMBULATORY_CARE_PROVIDER_SITE_OTHER): Payer: Self-pay

## 2019-08-13 ENCOUNTER — Other Ambulatory Visit: Payer: Self-pay

## 2019-08-13 ENCOUNTER — Encounter (INDEPENDENT_AMBULATORY_CARE_PROVIDER_SITE_OTHER): Payer: Self-pay | Admitting: Family Medicine

## 2019-08-13 ENCOUNTER — Ambulatory Visit
Admission: RE | Admit: 2019-08-13 | Discharge: 2019-08-13 | Disposition: A | Discharge status: Home / Self Care | Payer: 59 | Source: Ambulatory Visit | Attending: Adult Health Nurse Practitioner | Admitting: Adult Health Nurse Practitioner

## 2019-08-13 VITALS — BP 116/79 | HR 75 | Temp 97.9°F | Ht 60.0 in | Wt 214.0 lb

## 2019-08-13 DIAGNOSIS — E559 Vitamin D deficiency, unspecified: Secondary | ICD-10-CM

## 2019-08-13 DIAGNOSIS — Z9189 Other specified personal risk factors, not elsewhere classified: Secondary | ICD-10-CM | POA: Diagnosis not present

## 2019-08-13 DIAGNOSIS — R5383 Other fatigue: Secondary | ICD-10-CM

## 2019-08-13 DIAGNOSIS — Z1231 Encounter for screening mammogram for malignant neoplasm of breast: Secondary | ICD-10-CM

## 2019-08-13 DIAGNOSIS — E8881 Metabolic syndrome: Secondary | ICD-10-CM | POA: Diagnosis not present

## 2019-08-13 DIAGNOSIS — E88819 Insulin resistance, unspecified: Secondary | ICD-10-CM | POA: Insufficient documentation

## 2019-08-13 DIAGNOSIS — Z6841 Body Mass Index (BMI) 40.0 and over, adult: Secondary | ICD-10-CM

## 2019-08-13 MED ORDER — METFORMIN HCL 500 MG PO TABS: 500.0000 mg | ORAL_TABLET | Freq: Every day | ORAL | 0 refills | Status: DC

## 2019-08-13 NOTE — Progress Notes (Signed)
Chief Complaint:   OBESITY [name] [name] is here to discuss her progress with her obesity treatment plan along with follow-up of her obesity related diagnoses. [name] [name] is on the Category 1 Plan and states she is following her eating plan approximately 100% of the time. [name] [name] states she is walking for 45 minutes 7 times per week.  Today's visit was #: 7 Starting weight: 232 lbs Starting date: 05/02/2019 Today's weight: 214 lbs Today's date: 08/13/2019 Total lbs lost to date: 18 Total lbs lost since last in-office visit: 2  Interim History: [name] [name] is doing very well on the plan overall and has lost 18 lbs over the past 3.5 months. Her water intake is good. She is getting all of the protein in. She denies excessive cravings.  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  She recently graduated from [employer] with AD in business administration degree. She is starting her Bachelor's in the Fall at [employer].  Subjective:   1. Insulin resistance [name] [name]'s last A1c was very low at 4.8. Her fasting insulin was elevated at 19.9. She is on metformin once daily.   2. Vitamin D deficiency [name] [name]'s last Vit D level was low at 30.1. She is on weekly prescription Vit D.  3. Other fatigue [name] [name]'s sleep study in 2015 showed some apnea but not enough for CPAP. She works days and evening shifts, and she feels fatigue is related to this. No anemia per her last CBC. She notes her quality of sleep has improved since she has lost weight. She tries to get 8 hours of sleep per day, but she doesn't always get it.  4. At risk for diabetes mellitus [name] [name] is at higher than average risk for developing diabetes due to her obesity and insulin resisitance.   Assessment/Plan:   1. Insulin resistance [name] [name] will continue to work on weight loss, exercise, and decreasing simple carbohydrates to help decrease the risk of diabetes. We will check labs today, and we will refill metformin for 1  month. [name] [name] agreed to follow-up with [place] as directed to closely monitor her progress.  - metFORMIN (GLUCOPHAGE) 500 MG tablet; Take 1 tablet (500 mg total) by mouth daily.  Dispense: 30 tablet; Refill: 0  - Insulin, random  2. Vitamin D deficiency Low Vitamin D level contributes to fatigue and are associated with obesity, breast, and colon cancer. [name] [name] agreed to continue taking prescription Vitamin D 50,000 IU every week and will follow-up for routine testing of Vitamin D, at least 2-3 times per year to avoid over-replacement. We will check labs today.  - VITAMIN D 25 Hydroxy (Vit-D Deficiency, Fractures)  3. Other fatigue [name] [name] does feel that her weight is causing her energy to be lower than it should be. Fatigue may be related to obesity, depression or many other causes. She is to work on trying to get 8 hours of sleep per day. [name] [name] will focus on self care including making healthy food choices, increasing physical activity and focusing on stress reduction.  4. At risk for diabetes mellitus [name] [name] was given approximately 15 minutes of diabetes education and counseling today. We discussed intensive lifestyle modifications today with an emphasis on weight loss as well as increasing exercise and decreasing simple carbohydrates in her diet. We also reviewed medication options with an emphasis on risk versus benefit of those discussed.   Repetitive spaced learning was employed today to elicit superior memory formation and behavioral change.  5. Class 3 severe obesity with serious comorbidity and  body mass index (BMI) of 40.0 to 44.9 in adult, unspecified obesity type [name] [name]) [name] [name] is currently in the action stage of change. As such, her goal is to continue with weight loss efforts. She has agreed to the Category 1 Plan.   Exercise goals: As is.  Behavioral modification strategies: planning for success.  [name] [name] has agreed to follow-up with our clinic in 3 weeks. She was  informed of the importance of frequent follow-up visits to maximize her success with intensive lifestyle modifications for her multiple health conditions.   [name] [name] agreed to keep her next visit at the agreed upon time to discuss these results.  Objective:   Blood pressure 116/79, pulse 75, temperature 97.9 F (36.6 C), temperature source Oral, height 5' (1.524 m), weight 214 lb (97.1 kg), last menstrual period 08/06/2019, SpO2 96 %. Body mass index is 41.79 kg/m.  General: Cooperative, alert, well developed, in no acute distress. HEENT: Conjunctivae and lids unremarkable. Cardiovascular: Regular rhythm.  Lungs: Normal work of breathing. Neurologic: No focal deficits.   Lab Results  Component Value Date   CREATININE 0.90 05/02/2019   BUN 11 05/02/2019   NA 138 05/02/2019   K 4.2 05/02/2019   CL 102 05/02/2019   CO2 23 05/02/2019   Lab Results  Component Value Date   ALT 14 05/02/2019   AST 24 05/02/2019   ALKPHOS 58 05/02/2019   BILITOT 0.3 05/02/2019   Lab Results  Component Value Date   HGBA1C 4.8 05/02/2019   HGBA1C 4.7 04/11/2014   HGBA1C 5.1 05/02/2013   Lab Results  Component Value Date   INSULIN 19.9 05/02/2019   Lab Results  Component Value Date   TSH 1.930 05/02/2019   Lab Results  Component Value Date   CHOL 172 05/02/2019   HDL 61 05/02/2019   LDLCALC 92 05/02/2019   TRIG 109 05/02/2019   Lab Results  Component Value Date   WBC 6.0 05/02/2019   HGB 14.0 05/02/2019   HCT 42.1 05/02/2019   MCV 100 (H) 05/02/2019   PLT 262 05/02/2019   Lab Results  Component Value Date   IRON 101 05/02/2019   TIBC 333 05/02/2019   FERRITIN 71 05/02/2019   Attestation Statements:   Reviewed by clinician on day of visit: allergies, medications, problem list, medical history, surgical history, family history, social history, and previous encounter notes.   Wilhemena Durie, am acting as Location manager for [employer], FNP-C.  I have reviewed the  above documentation for accuracy and completeness, and I agree with the above. -  Georgianne Fick, FNP

## 2019-08-14 ENCOUNTER — Other Ambulatory Visit: Payer: Self-pay | Admitting: Adult Health Nurse Practitioner

## 2019-08-14 DIAGNOSIS — R928 Other abnormal and inconclusive findings on diagnostic imaging of breast: Secondary | ICD-10-CM

## 2019-08-14 LAB — INSULIN, RANDOM: INSULIN: 14.6 u[IU]/mL (ref 2.6–24.9)

## 2019-08-14 LAB — VITAMIN D 25 HYDROXY (VIT D DEFICIENCY, FRACTURES): Vit D, 25-Hydroxy: 61.2 ng/mL (ref 30.0–100.0)

## 2019-08-15 ENCOUNTER — Telehealth: Payer: Self-pay | Admitting: Obstetrics and Gynecology

## 2019-08-15 NOTE — Telephone Encounter (Signed)
Patient want to know how long will she have a period with the nuvaring.

## 2019-08-15 NOTE — Telephone Encounter (Signed)
OV 07/23/19 Perimenopausal BTL and vasectomy Last labs, not anemic from 05/02/2019 at PCP  Spoke with pt. Pt states was seen for vasomotor sx on 07/23/19 with Dr Talbert Nan. Pt states was placed on Nuvaring Rx and to place with start of next cycle. Pt states placed ring on 08/06/19 with LMP. Pt states bleeding minimal with cycle and now having BTB that is brown light spotting. Denies heavy bleeding, clots, fever, chills. Pt states only wearing panty liner once a day and "just annoying" to deal with.   Pt advised was normal after starting Nuvaring and it can take 2-3 months for body to adjust. Pt given heavy bleeding precautions. Pt agreeable and verbalized understanding. Pt advised will update and review with  Dr Talbert Nan and will return call to pt if any further recommendations. Pt agreeable.   Routing to Dr Talbert Nan.  Encounter closed.

## 2019-08-21 ENCOUNTER — Ambulatory Visit
Admission: RE | Admit: 2019-08-21 | Discharge: 2019-08-21 | Disposition: A | Discharge status: Home / Self Care | Payer: 59 | Source: Ambulatory Visit | Attending: Adult Health Nurse Practitioner | Admitting: Adult Health Nurse Practitioner

## 2019-08-21 ENCOUNTER — Other Ambulatory Visit: Payer: Self-pay

## 2019-08-21 DIAGNOSIS — R928 Other abnormal and inconclusive findings on diagnostic imaging of breast: Secondary | ICD-10-CM

## 2019-09-04 ENCOUNTER — Other Ambulatory Visit: Payer: Self-pay

## 2019-09-04 ENCOUNTER — Ambulatory Visit (INDEPENDENT_AMBULATORY_CARE_PROVIDER_SITE_OTHER): Payer: 59 | Admitting: Family Medicine

## 2019-09-04 ENCOUNTER — Encounter (INDEPENDENT_AMBULATORY_CARE_PROVIDER_SITE_OTHER): Payer: Self-pay | Admitting: Family Medicine

## 2019-09-04 VITALS — BP 112/78 | HR 84 | Temp 98.1°F | Ht 60.0 in | Wt 211.0 lb

## 2019-09-04 DIAGNOSIS — Z6841 Body Mass Index (BMI) 40.0 and over, adult: Secondary | ICD-10-CM | POA: Diagnosis not present

## 2019-09-04 DIAGNOSIS — E8881 Metabolic syndrome: Secondary | ICD-10-CM

## 2019-09-04 DIAGNOSIS — E559 Vitamin D deficiency, unspecified: Secondary | ICD-10-CM | POA: Diagnosis not present

## 2019-09-04 MED ORDER — VITAMIN D3 125 MCG (5000 UT) PO CAPS: 1.0000 | ORAL_CAPSULE | Freq: Every day | ORAL | 0 refills | Status: DC

## 2019-09-04 NOTE — Progress Notes (Addendum)
Chief Complaint:   OBESITY [name] [name] is here to discuss her progress with her obesity treatment plan along with follow-up of her obesity related diagnoses. [name] [name] is on the Category 1 Plan and states she is following her eating plan approximately 90-100% of the time. [name] [name] states she is walking 2-3 miles 5-7 times per week.  Today's visit was #: 8 Starting weight: 232 lbs Starting date: 05/02/2019 Today's weight: 211 lbs Today's date: 09/04/2019 Total lbs lost to date: 21 Total lbs lost since last in-office visit: 3  Interim History: [name] [name] is doing very well on the plan. She is quite motivated to get back down to 170 lbs, which was her low weight after her sleeve gastrectomy (2015). She is compliant with multivitamins daily. She does get in all of the prescribed protein.  Subjective:   1. Insulin resistance [name] [name] has a diagnosis of insulin resistance based on her elevated fasting insulin level >5. She notes polyphagia in the early morning. She take metformin at lunch. She continues to work on diet and exercise to decrease her risk of diabetes.  Lab Results  Component Value Date   INSULIN 14.6 08/13/2019   INSULIN 19.9 05/02/2019   Lab Results  Component Value Date   HGBA1C 4.8 05/02/2019   2. Vitamin D deficiency [name] [name] is on prescription Vit D. Her Vit D level is now at goal.  Assessment/Plan:   1. Insulin resistance [name] [name] will continue to work on weight loss, exercise, and decreasing simple carbohydrates to help decrease the risk of diabetes. [name] [name] will move metformin to the morning. [name] [name] agreed to follow-up with [place] as directed to closely monitor her progress.  2. Vitamin D deficiency Low Vitamin D level contributes to fatigue and are associated with obesity, breast, and colon cancer. [name] [name] agrees to discontinue prescription Vitamin D, and start OTC Vit D3 5,000 IU daily. She will follow-up for routine testing of Vitamin D, at least 2-3  times per year to avoid over-replacement.  3. Class 3 severe obesity with serious comorbidity and body mass index (BMI) of 40.0 to 44.9 in adult, unspecified obesity type ([name] [name]) [name] [name] is currently in the action stage of change. As such, her goal is to continue with weight loss efforts. She has agreed to the Category 1 Plan.   Exercise goals: As is, and she is to add resistance training 2 times per week.  Behavioral modification strategies: planning for success.  [name] [name] has agreed to follow-up with our clinic in 3 weeks. She was informed of the importance of frequent follow-up visits to maximize her success with intensive lifestyle modifications for her multiple health conditions.   Objective:   Blood pressure 112/78, pulse 84, temperature 98.1 F (36.7 C), temperature source Oral, height 5' (1.524 m), weight 211 lb (95.7 kg), last menstrual period 08/06/2019, SpO2 98 %. Body mass index is 41.21 kg/m.  General: Cooperative, alert, well developed, in no acute distress. HEENT: Conjunctivae and lids unremarkable. Cardiovascular: Regular rhythm.  Lungs: Normal work of breathing. Neurologic: No focal deficits.   Lab Results  Component Value Date   CREATININE 0.90 05/02/2019   BUN 11 05/02/2019   NA 138 05/02/2019   K 4.2 05/02/2019   CL 102 05/02/2019   CO2 23 05/02/2019   Lab Results  Component Value Date   ALT 14 05/02/2019   AST 24 05/02/2019   ALKPHOS 58 05/02/2019   BILITOT 0.3 05/02/2019   Lab Results  Component Value Date   HGBA1C 4.8 05/02/2019  HGBA1C 4.7 04/11/2014   HGBA1C 5.1 05/02/2013   Lab Results  Component Value Date   INSULIN 14.6 08/13/2019   INSULIN 19.9 05/02/2019   Lab Results  Component Value Date   TSH 1.930 05/02/2019   Lab Results  Component Value Date   CHOL 172 05/02/2019   HDL 61 05/02/2019   LDLCALC 92 05/02/2019   TRIG 109 05/02/2019   Lab Results  Component Value Date   WBC 6.0 05/02/2019   HGB 14.0 05/02/2019   HCT  42.1 05/02/2019   MCV 100 (H) 05/02/2019   PLT 262 05/02/2019   Lab Results  Component Value Date   IRON 101 05/02/2019   TIBC 333 05/02/2019   FERRITIN 71 05/02/2019   Attestation Statements:   Reviewed by clinician on day of visit: allergies, medications, problem list, medical history, surgical history, family history, social history, and previous encounter notes.   Wilhemena Durie, am acting as Location manager for [employer], FNP-C.  I have reviewed the above documentation for accuracy and completeness, and I agree with the above. -  Georgianne Fick, FNP

## 2019-09-13 ENCOUNTER — Encounter: Payer: Self-pay | Admitting: Family Medicine

## 2019-09-13 ENCOUNTER — Ambulatory Visit: Payer: 59 | Admitting: Family Medicine

## 2019-09-13 ENCOUNTER — Other Ambulatory Visit: Payer: Self-pay

## 2019-09-13 VITALS — BP 122/83 | HR 86 | Temp 98.4°F | Ht 60.0 in | Wt 216.2 lb

## 2019-09-13 DIAGNOSIS — M5090 Cervical disc disorder, unspecified, unspecified cervical region: Secondary | ICD-10-CM

## 2019-09-13 DIAGNOSIS — M542 Cervicalgia: Secondary | ICD-10-CM | POA: Diagnosis not present

## 2019-09-13 MED ORDER — PREDNISONE 20 MG PO TABS: ORAL_TABLET | ORAL | 0 refills | Status: DC

## 2019-09-13 NOTE — Patient Instructions (Addendum)
  Take prednisone 20 mg 3 daily for 2 days, then 2 daily for 2 days, then 1 daily for 2 days then one half for 4 days for inflammation in neck.  Continue use your Flexeril that you have  Take acetaminophen (Tylenol) (500 mg 2 pills 3 times daily as needed for pain.  I would recommend taking it on a regular basis for a few days until waiting start getting the pain calm down.  Applying ice or heat for about 15 minutes at a time or alternating the 2 can often be of benefit.  Keep your appointment with the specialist  In the event of acutely worsening problems with neurologic weakness of arms or legs going to the emergency room.  Self work through Architectural technologist.   If you have lab work done today you will be contacted with your lab results within the next 2 weeks.  If you have not heard from [place] then please contact us. The fastest way to get your results is to register for My Chart.   IF you received an x-ray today, you will receive an invoice from [facility]. Please contact [facility] at [phone] with questions or concerns regarding your invoice.   IF you received labwork today, you will receive an invoice from [place]. Please contact [facility] at [phone] with questions or concerns regarding your invoice.   Our billing staff will not be able to assist you with questions regarding bills from these companies.  You will be contacted with the lab results as soon as they are available. The fastest way to get your results is to activate your My Chart account. Instructions are located on the last page of this paperwork. If you have not heard from [place] regarding the results in 2 weeks, please contact this office.

## 2019-09-13 NOTE — Progress Notes (Signed)
Patient ID: [name] [name], female    DOB: [birth year]  Age: 44 y.o. MRN: [id number]  Chief Complaint  Patient presents with  . Neck Pain    Pt stated that she has been experiencing neck pain for the past week. She stated that she fell back in 2017 and messed her neck up.    Subjective:   Patient has history of having pain in her neck badly since last week.  She has been doing exercises with the weight loss program at [facility], and thinks she strained her upper neck.  She has a history of several years of her neck problems.  Previous MRI has shown disc disease at C6-7.  She had been doing okay before this flareup.  No major injury, just exercising.  Pain is in the neck and upper shoulders.  No radicular pain to the arms.  No gait disturbances.  Current allergies, medications, problem list, past/family and social histories reviewed.  Objective:  BP 122/83 (BP Location: Right Arm, Patient Position: Sitting, Cuff Size: Large)   Pulse 86   Temp 98.4 F (36.9 C) (Temporal)   Ht 5' (1.524 m)   Wt 216 lb 3.2 oz (98.1 kg)   SpO2 99%   BMI 42.22 kg/m   Pleasant alert and oriented.  Hurting quite a lot.  Moves a little cautiously.  Neck has fairly good range of motion.  Quite tender in the lower cervical spine, upper back paraspinous muscles and trapezius areas.  Strength and movement good in arms and legs.  She has an appointment with a specialist next week again.  Assessment & Plan:   Assessment: 1. Cervical disc disease   2. Neck pain       Plan: See instructions.  She has Flexeril at home.  She has been taking some of that.  She does not like stronger pain medications.  Is only taking as needed Tylenol, not scheduled.  Advised more regular use of it.  No orders of the defined types were placed in this encounter.   Meds ordered this encounter  Medications  . predniSONE (DELTASONE) 20 MG tablet    Sig: Take 3 each morning for 2 days, then 2 for 2 days, then 1 for  2 days, then one half for 4 days.    Dispense:  14 tablet    Refill:  0         Patient Instructions    Take prednisone 20 mg 3 daily for 2 days, then 2 daily for 2 days, then 1 daily for 2 days then one half for 4 days for inflammation in neck.  Continue use your Flexeril that you have  Take acetaminophen (Tylenol) (500 mg 2 pills 3 times daily as needed for pain.  I would recommend taking it on a regular basis for a few days until waiting start getting the pain calm down.  Applying ice or heat for about 15 minutes at a time or alternating the 2 can often be of benefit.  Keep your appointment with the specialist  In the event of acutely worsening problems with neurologic weakness of arms or legs going to the emergency room.  Self work through Architectural technologist.   If you have lab work done today you will be contacted with your lab results within the next 2 weeks.  If you have not heard from [place] then please contact us. The fastest way to get your results is to register for My Chart.   IF you received an  x-ray today, you will receive an invoice from [facility]. Please contact [facility] at [phone] with questions or concerns regarding your invoice.   IF you received labwork today, you will receive an invoice from [place]. Please contact [facility] at [phone] with questions or concerns regarding your invoice.   Our billing staff will not be able to assist you with questions regarding bills from these companies.  You will be contacted with the lab results as soon as they are available. The fastest way to get your results is to activate your My Chart account. Instructions are located on the last page of this paperwork. If you have not heard from [place] regarding the results in 2 weeks, please contact this office.        Return if symptoms worsen or fail to improve.   Ruben Reason, MD 09/13/2019

## 2019-09-16 DIAGNOSIS — M542 Cervicalgia: Secondary | ICD-10-CM | POA: Insufficient documentation

## 2019-09-16 DIAGNOSIS — Z6841 Body Mass Index (BMI) 40.0 and over, adult: Secondary | ICD-10-CM | POA: Insufficient documentation

## 2019-09-20 ENCOUNTER — Other Ambulatory Visit (INDEPENDENT_AMBULATORY_CARE_PROVIDER_SITE_OTHER): Payer: Self-pay | Admitting: Family Medicine

## 2019-09-20 DIAGNOSIS — E8881 Metabolic syndrome: Secondary | ICD-10-CM

## 2019-09-24 ENCOUNTER — Ambulatory Visit (INDEPENDENT_AMBULATORY_CARE_PROVIDER_SITE_OTHER): Payer: 59 | Admitting: Family Medicine

## 2019-09-24 ENCOUNTER — Encounter (INDEPENDENT_AMBULATORY_CARE_PROVIDER_SITE_OTHER): Payer: Self-pay | Admitting: Family Medicine

## 2019-09-24 ENCOUNTER — Other Ambulatory Visit: Payer: Self-pay

## 2019-09-24 VITALS — BP 113/81 | HR 82 | Temp 98.3°F | Ht 60.0 in | Wt 209.0 lb

## 2019-09-24 DIAGNOSIS — Z9884 Bariatric surgery status: Secondary | ICD-10-CM | POA: Diagnosis not present

## 2019-09-24 DIAGNOSIS — E8881 Metabolic syndrome: Secondary | ICD-10-CM | POA: Diagnosis not present

## 2019-09-24 DIAGNOSIS — Z6841 Body Mass Index (BMI) 40.0 and over, adult: Secondary | ICD-10-CM

## 2019-09-24 DIAGNOSIS — Z9189 Other specified personal risk factors, not elsewhere classified: Secondary | ICD-10-CM | POA: Diagnosis not present

## 2019-09-24 MED ORDER — METFORMIN HCL 500 MG PO TABS: 500.0000 mg | ORAL_TABLET | Freq: Every day | ORAL | 0 refills | Status: DC

## 2019-09-25 NOTE — Progress Notes (Signed)
Chief Complaint:   OBESITY [name] [name] is here to discuss her progress with her obesity treatment plan along with follow-up of her obesity related diagnoses. [name] [name] is on the Category 1 Plan and states she is following her eating plan approximately 90% of the time. [name] [name] states she is walking for 30 minutes-1 hour 4 times per week.  Today's visit was #: 9 Starting weight: 232 lbs Starting date: 05/02/2019 Today's weight: 209 lbs Today's date: 09/24/2019 Total lbs lost to date: 23 Total lbs lost since last in-office visit: 2  Interim History: [name] [name] notes some boredom with food on the plan. She is getting all of the protein in.  Subjective:   1. Insulin resistance [name] [name] has a diagnosis of insulin resistance based on her elevated fasting insulin level >5. She is on metformin, and denies polyphagia, nausea, or diarrhea. She continues to work on diet and exercise to decrease her risk of diabetes.  Lab Results  Component Value Date   INSULIN 14.6 08/13/2019   INSULIN 19.9 05/02/2019   Lab Results  Component Value Date   HGBA1C 4.8 05/02/2019   2. 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 She follows up with Dr. Hassell Done yearly and she takes multivitamins daily.  3. At risk for malnutrition [name] [name] is at increased risk for malnutrition due to status post sleeve gastrectomy.  Assessment/Plan:   1. Insulin resistance [name] [name] will continue to work on weight loss, exercise, and decreasing simple carbohydrates to help decrease the risk of diabetes. We will refill metformin for 1 month. [name] [name] agreed to follow-up with [place] as directed to closely monitor her progress.  - metFORMIN (GLUCOPHAGE) 500 MG tablet; Take 1 tablet (500 mg total) by mouth daily.  Dispense: 30 tablet; Refill: 0  2. 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 Hassell Done, and continue taking daily  multivitamins.  3. At risk for malnutrition [name] [name] was given approximately 15 minutes of counseling today regarding prevention of malnutrition and ways to meet macronutrient goals..   4. Class 3 severe obesity with serious comorbidity and body mass index (BMI) of 40.0 to 44.9 in adult, unspecified obesity type ([place]) [name] [name] is currently in the action stage of change. As such, her goal is to continue with weight loss efforts. She has agreed to the Category 1 Plan and keeping a food journal and adhering to recommended goals of 300-500 calories and 35 grams of protein at supper daily.   Handouts given today: Recipes, and Journaling.  Exercise goals: All adults should avoid inactivity. Some physical activity is better than none, and adults who participate in any amount of physical activity gain some health benefits.  Behavioral modification strategies: meal planning and cooking strategies and planning for success.  [name] [name] has agreed to follow-up with our clinic in 3 weeks. She was informed of the importance of frequent follow-up visits to maximize her success with intensive lifestyle modifications for her multiple health conditions.   Objective:   Blood pressure 113/81, pulse 82, temperature 98.3 F (36.8 C), temperature source Oral, height 5' (1.524 m), weight 209 lb (94.8 kg), last menstrual period 08/06/2019, SpO2 97 %. Body mass index is 40.82 kg/m.  General: Cooperative, alert, well developed, in no acute distress. HEENT: Conjunctivae and lids unremarkable. Cardiovascular: Regular rhythm.  Lungs: Normal work of breathing. Neurologic: No focal deficits.   Lab Results  Component Value Date   CREATININE 0.90 05/02/2019   BUN 11  05/02/2019   NA 138 05/02/2019   K 4.2 05/02/2019   CL 102 05/02/2019   CO2 23 05/02/2019   Lab Results  Component Value Date   ALT 14 05/02/2019   AST 24 05/02/2019   ALKPHOS 58 05/02/2019   BILITOT 0.3 05/02/2019   Lab Results  Component  Value Date   HGBA1C 4.8 05/02/2019   HGBA1C 4.7 04/11/2014   HGBA1C 5.1 05/02/2013   Lab Results  Component Value Date   INSULIN 14.6 08/13/2019   INSULIN 19.9 05/02/2019   Lab Results  Component Value Date   TSH 1.930 05/02/2019   Lab Results  Component Value Date   CHOL 172 05/02/2019   HDL 61 05/02/2019   LDLCALC 92 05/02/2019   TRIG 109 05/02/2019   Lab Results  Component Value Date   WBC 6.0 05/02/2019   HGB 14.0 05/02/2019   HCT 42.1 05/02/2019   MCV 100 (H) 05/02/2019   PLT 262 05/02/2019   Lab Results  Component Value Date   IRON 101 05/02/2019   TIBC 333 05/02/2019   FERRITIN 71 05/02/2019   Attestation Statements:   Reviewed by clinician on day of visit: allergies, medications, problem list, medical history, surgical history, family history, social history, and previous encounter notes.   Wilhemena Durie, am acting as Location manager for [employer], FNP-C.  I have reviewed the above documentation for accuracy and completeness, and I agree with the above. -  Georgianne Fick, FNP

## 2019-09-30 ENCOUNTER — Encounter (INDEPENDENT_AMBULATORY_CARE_PROVIDER_SITE_OTHER): Payer: Self-pay | Admitting: Family Medicine

## 2019-10-15 ENCOUNTER — Encounter (INDEPENDENT_AMBULATORY_CARE_PROVIDER_SITE_OTHER): Payer: Self-pay | Admitting: Family Medicine

## 2019-10-15 ENCOUNTER — Ambulatory Visit (INDEPENDENT_AMBULATORY_CARE_PROVIDER_SITE_OTHER): Payer: 59 | Admitting: Family Medicine

## 2019-10-15 ENCOUNTER — Other Ambulatory Visit: Payer: Self-pay

## 2019-10-15 VITALS — BP 108/75 | HR 74 | Temp 98.0°F | Ht 60.0 in | Wt 212.0 lb

## 2019-10-15 DIAGNOSIS — E8881 Metabolic syndrome: Secondary | ICD-10-CM | POA: Diagnosis not present

## 2019-10-15 DIAGNOSIS — Z6841 Body Mass Index (BMI) 40.0 and over, adult: Secondary | ICD-10-CM | POA: Diagnosis not present

## 2019-10-17 NOTE — Progress Notes (Signed)
Chief Complaint:   OBESITY [name] [name] is here to discuss her progress with her obesity treatment plan along with follow-up of her obesity related diagnoses. [name] [name] is on the Category 1 Plan and keeping a food journal and adhering to recommended goals of 300-500 calories and 35 grams of protein at supper daily and states she is following her eating plan approximately 80% of the time. [name] [name] states she is walking for 30 minutes 3 times per week.  Today's visit was #: 10 Starting weight: 232 lbs Starting date: 05/02/2019 Today's weight: 212 lbs Today's date: 10/15/2019 Total lbs lost to date: 20 Total lbs lost since last in-office visit: 0  Interim History: [name] [name] has work schedule has been very disordered (days and nights), and she overeats and undereats. She is also in college for her bachelors degree in business administration. She has been unable to meal plan due to lack of time.  Subjective:   1. Insulin resistance [name] [name] has a diagnosis of insulin resistance based on her elevated fasting insulin level >5. She denies polyphagia and she is not on metformin. She continues to work on diet and exercise to decrease her risk of diabetes.  Lab Results  Component Value Date   INSULIN 14.6 08/13/2019   INSULIN 19.9 05/02/2019   Lab Results  Component Value Date   HGBA1C 4.8 05/02/2019   Assessment/Plan:   1. Insulin resistance [name] [name] will continue meal plan, and will continue to work on weight loss, exercise, and decreasing simple carbohydrates to help decrease the risk of diabetes. [name] [name] agreed to follow-up with [place] as directed to closely monitor her progress.  2. Class 3 severe obesity with serious comorbidity and body mass index (BMI) of 40.0 to 44.9 in adult, unspecified obesity type ([facility]) [name] [name] is currently in the action stage of change. As such, her goal is to continue with weight loss efforts. She has agreed to change to following a lower carbohydrate,  vegetable and lean protein rich diet plan.   Handout given today: Low Carbohydrate Plan.  Exercise goals: As is.  Behavioral modification strategies: increasing lean protein intake and decreasing simple carbohydrates.  [name] [name] has agreed to follow-up with our clinic in 3 weeks. She was informed of the importance of frequent follow-up visits to maximize her success with intensive lifestyle modifications for her multiple health conditions.   Objective:   Blood pressure 108/75, pulse 74, temperature 98 F (36.7 C), temperature source Oral, height 5' (1.524 m), weight 212 lb (96.2 kg), last menstrual period 08/08/2019, SpO2 99 %. Body mass index is 41.4 kg/m.  General: Cooperative, alert, well developed, in no acute distress. HEENT: Conjunctivae and lids unremarkable. Cardiovascular: Regular rhythm.  Lungs: Normal work of breathing. Neurologic: No focal deficits.   Lab Results  Component Value Date   CREATININE 0.90 05/02/2019   BUN 11 05/02/2019   NA 138 05/02/2019   K 4.2 05/02/2019   CL 102 05/02/2019   CO2 23 05/02/2019   Lab Results  Component Value Date   ALT 14 05/02/2019   AST 24 05/02/2019   ALKPHOS 58 05/02/2019   BILITOT 0.3 05/02/2019   Lab Results  Component Value Date   HGBA1C 4.8 05/02/2019   HGBA1C 4.7 04/11/2014   HGBA1C 5.1 05/02/2013   Lab Results  Component Value Date   INSULIN 14.6 08/13/2019   INSULIN 19.9 05/02/2019   Lab Results  Component Value Date   TSH 1.930 05/02/2019   Lab Results  Component Value Date   CHOL  172 05/02/2019   HDL 61 05/02/2019   LDLCALC 92 05/02/2019   TRIG 109 05/02/2019   Lab Results  Component Value Date   WBC 6.0 05/02/2019   HGB 14.0 05/02/2019   HCT 42.1 05/02/2019   MCV 100 (H) 05/02/2019   PLT 262 05/02/2019   Lab Results  Component Value Date   IRON 101 05/02/2019   TIBC 333 05/02/2019   FERRITIN 71 05/02/2019   Attestation Statements:   Reviewed by clinician on day of visit: allergies,  medications, problem list, medical history, surgical history, family history, social history, and previous encounter notes.   Wilhemena Durie, am acting as Location manager for [employer], FNP-C.  I have reviewed the above documentation for accuracy and completeness, and I agree with the above. -  Georgianne Fick, FNP

## 2019-10-25 ENCOUNTER — Telehealth: Payer: Self-pay | Admitting: Obstetrics and Gynecology

## 2019-10-25 MED ORDER — ETONOGESTREL-ETHINYL ESTRADIOL 0.12-0.015 MG/24HR VA RING: 1.0000 | VAGINAL_RING | VAGINAL | 1 refills | Status: DC

## 2019-10-25 NOTE — Telephone Encounter (Signed)
Spoke with patient, advised as seen below per Dr. Quincy Simmonds.  Rx for Nuvaring #1/1RF sent to verified pharmacy.  Patient verbalizes understanding and is agreeable.   Encounter closed.

## 2019-10-25 NOTE — Telephone Encounter (Signed)
Patient cancelled 3 month fu on nuvaring. She is doing fine and just want a refill.

## 2019-10-25 NOTE — Telephone Encounter (Signed)
This is Dr. Quincy Simmonds reviewing Dr. Gentry Fitz labs while she is out of the office.  Ok to refill NuvaRing until annual exam in September.

## 2019-10-29 ENCOUNTER — Encounter: Payer: 59 | Admitting: Registered Nurse

## 2019-10-30 ENCOUNTER — Ambulatory Visit: Payer: 59 | Admitting: Obstetrics and Gynecology

## 2019-11-05 ENCOUNTER — Encounter (INDEPENDENT_AMBULATORY_CARE_PROVIDER_SITE_OTHER): Payer: Self-pay | Admitting: Physician Assistant

## 2019-11-05 ENCOUNTER — Ambulatory Visit (INDEPENDENT_AMBULATORY_CARE_PROVIDER_SITE_OTHER): Payer: 59 | Admitting: Physician Assistant

## 2019-11-05 ENCOUNTER — Ambulatory Visit (INDEPENDENT_AMBULATORY_CARE_PROVIDER_SITE_OTHER): Payer: 59 | Admitting: Family Medicine

## 2019-11-05 ENCOUNTER — Other Ambulatory Visit: Payer: Self-pay

## 2019-11-05 VITALS — BP 102/72 | HR 87 | Temp 97.9°F | Ht 60.0 in | Wt 210.0 lb

## 2019-11-05 DIAGNOSIS — E8881 Metabolic syndrome: Secondary | ICD-10-CM | POA: Diagnosis not present

## 2019-11-05 DIAGNOSIS — Z9189 Other specified personal risk factors, not elsewhere classified: Secondary | ICD-10-CM | POA: Diagnosis not present

## 2019-11-05 DIAGNOSIS — Z6841 Body Mass Index (BMI) 40.0 and over, adult: Secondary | ICD-10-CM

## 2019-11-05 DIAGNOSIS — E559 Vitamin D deficiency, unspecified: Secondary | ICD-10-CM | POA: Diagnosis not present

## 2019-11-05 MED ORDER — METFORMIN HCL 500 MG PO TABS: 500.0000 mg | ORAL_TABLET | Freq: Every day | ORAL | 0 refills | Status: DC

## 2019-11-05 NOTE — Progress Notes (Signed)
Chief Complaint:   OBESITY [name] [name] is here to discuss her progress with her obesity treatment plan along with follow-up of her obesity related diagnoses. [name] [name] is on the Category 1 Plan and states she is following her eating plan approximately 80% of the time. Lusero states she is exercising 0 minutes 0 times per week.  Today's visit was #: 11 Starting weight: 232 lbs Starting date: 05/02/2019 Today's weight: 210 lbs Today's date: 11/05/2019 Total lbs lost to date: 22 Total lbs lost since last in-office visit: 2  Interim History: [name] [name] states that she is back to a normal schedule at work. She gets up very early for work and usually eats sausage with a protein shake. She is not weighing her protein and is not getting enough protein at dinner.  Subjective:   Vitamin D deficiency. Astra is on Vitamin D, which she is tolerating well.   Ref. Range 08/13/2019 09:03  Vitamin D, 25-Hydroxy Latest Ref Range: 30.0 - 100.0 ng/mL 61.2   Insulin resistance. [name] [name] has a diagnosis of insulin resistance based on her elevated fasting insulin level >5. She continues to work on diet and exercise to decrease her risk of diabetes. [name] [name] is on metformin. No nausea, vomiting, diarrhea, or polyphagia.   Lab Results  Component Value Date   INSULIN 14.6 08/13/2019   INSULIN 19.9 05/02/2019   Lab Results  Component Value Date   HGBA1C 4.8 05/02/2019   At risk for osteoporosis. [name] [name] is at higher risk of osteopenia and osteoporosis due to Vitamin D deficiency.   Assessment/Plan:   Vitamin D deficiency. Low Vitamin D level contributes to fatigue and are associated with obesity, breast, and colon cancer. She agrees to continue to take Vitamin D as directed and will follow-up for routine testing of Vitamin D, at least 2-3 times per year to avoid over-replacement.  Insulin resistance. [name] will continue to work on weight loss, exercise, and decreasing  simple carbohydrates to help decrease the risk of diabetes. [name] [name] agreed to follow-up with [place] as directed to closely monitor her progress. Refill was given for metFORMIN (GLUCOPHAGE) 500 MG tablet #30 with 0 refills.  At risk for osteoporosis. [name] [name] was given approximately 15 minutes of osteoporosis prevention counseling today. [name] [name] is at risk for osteopenia and osteoporosis due to her Vitamin D deficiency. She was encouraged to take her Vitamin D and follow her higher calcium diet and increase strengthening exercise to help strengthen her bones and decrease her risk of osteopenia and osteoporosis.  Repetitive spaced learning was employed today to elicit superior memory formation and behavioral change.  Class 3 severe obesity with serious comorbidity and body mass index (BMI) of 40.0 to 44.9 in adult, unspecified obesity type ([place]).  [name] [name] is currently in the action stage of change. As such, her goal is to continue with weight loss efforts. She has agreed to the Category 1 Plan.   Exercise goals: For substantial health benefits, adults should do at least 150 minutes (2 hours and 30 minutes) a week of moderate-intensity, or 75 minutes (1 hour and 15 minutes) a week of vigorous-intensity aerobic physical activity, or an equivalent combination of moderate- and vigorous-intensity aerobic activity. Aerobic activity should be performed in episodes of at least 10 minutes, and preferably, it should be spread throughout the week.  Behavioral modification strategies: increasing lean protein intake and no skipping meals.  [name] [name] has agreed to follow-up with our clinic in 2 weeks. She was informed of the importance of frequent  follow-up visits to maximize her success with intensive lifestyle modifications for her multiple health conditions.   Objective:   Blood pressure 102/72, pulse 87, temperature 97.9 F (36.6 C), temperature source Oral, height 5' (1.524 m), weight 210 lb (95.3 kg),  SpO2 98 %. Body mass index is 41.01 kg/m.  General: Cooperative, alert, well developed, in no acute distress. HEENT: Conjunctivae and lids unremarkable. Cardiovascular: Regular rhythm.  Lungs: Normal work of breathing. Neurologic: No focal deficits.   Lab Results  Component Value Date   CREATININE 0.90 05/02/2019   BUN 11 05/02/2019   NA 138 05/02/2019   K 4.2 05/02/2019   CL 102 05/02/2019   CO2 23 05/02/2019   Lab Results  Component Value Date   ALT 14 05/02/2019   AST 24 05/02/2019   ALKPHOS 58 05/02/2019   BILITOT 0.3 05/02/2019   Lab Results  Component Value Date   HGBA1C 4.8 05/02/2019   HGBA1C 4.7 04/11/2014   HGBA1C 5.1 05/02/2013   Lab Results  Component Value Date   INSULIN 14.6 08/13/2019   INSULIN 19.9 05/02/2019   Lab Results  Component Value Date   TSH 1.930 05/02/2019   Lab Results  Component Value Date   CHOL 172 05/02/2019   HDL 61 05/02/2019   LDLCALC 92 05/02/2019   TRIG 109 05/02/2019   Lab Results  Component Value Date   WBC 6.0 05/02/2019   HGB 14.0 05/02/2019   HCT 42.1 05/02/2019   MCV 100 (H) 05/02/2019   PLT 262 05/02/2019   Lab Results  Component Value Date   IRON 101 05/02/2019   TIBC 333 05/02/2019   FERRITIN 71 05/02/2019   Attestation Statements:   Reviewed by clinician on day of visit: allergies, medications, problem list, medical history, surgical history, family history, social history, and previous encounter notes.  IMichaelene Song, am acting as transcriptionist for Abby Potash, PA-C   I have reviewed the above documentation for accuracy and completeness, and I agree with the above. Abby Potash, PA-C

## 2019-11-26 ENCOUNTER — Ambulatory Visit (INDEPENDENT_AMBULATORY_CARE_PROVIDER_SITE_OTHER): Payer: 59 | Admitting: Family Medicine

## 2019-11-26 ENCOUNTER — Encounter (INDEPENDENT_AMBULATORY_CARE_PROVIDER_SITE_OTHER): Payer: Self-pay | Admitting: Family Medicine

## 2019-11-26 ENCOUNTER — Other Ambulatory Visit: Payer: Self-pay

## 2019-11-26 VITALS — BP 124/82 | HR 74 | Temp 98.1°F | Ht 60.0 in | Wt 211.0 lb

## 2019-11-26 DIAGNOSIS — Z6841 Body Mass Index (BMI) 40.0 and over, adult: Secondary | ICD-10-CM

## 2019-11-26 DIAGNOSIS — E8881 Metabolic syndrome: Secondary | ICD-10-CM

## 2019-11-26 DIAGNOSIS — Z9884 Bariatric surgery status: Secondary | ICD-10-CM | POA: Diagnosis not present

## 2019-11-26 NOTE — Progress Notes (Signed)
Chief Complaint:   OBESITY [name] [name] is here to discuss her progress with her obesity treatment plan along with follow-up of her obesity related diagnoses. [name] [name] is on the Category 1 Plan and states she is following her eating plan approximately 90% of the time. [name] [name] states she is walking for 20-60 minutes 4 times per week.  Today's visit was #: 12 Starting weight: 232 lbs Starting date: 05/02/2019 Today's weight: 211 lbs Today's date: 11/26/2019 Total lbs lost to date: 21 Total lbs lost since last in-office visit: 0  Interim History: [name] [name] is sticking to the plan very well. She notes she is having her period right now and feels she is retaining fluid. She is eating all of the prescribed protein and she denies overeating extra calories. She feels her weight is "stuck".  Subjective:   1. Insulin resistance [name] [name] has a diagnosis of insulin resistance based on her elevated fasting insulin level >5. Her hunger is satisfied. She is on metformin q daily. She denies excessive cravings. She continues to work on diet and exercise to decrease her risk of diabetes.  Lab Results  Component Value Date   INSULIN 14.6 08/13/2019   INSULIN 19.9 05/02/2019   Lab Results  Component Value Date   HGBA1C 4.8 05/02/2019   2. 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 She has a follow up with her surgeon next month.  Assessment/Plan:   1. Insulin resistance [name] [name] will continue metformin, and will continue to work on weight loss, exercise, and decreasing simple carbohydrates to help decrease the risk of diabetes. [name] [name] agreed to follow-up with [place] as directed to closely monitor her progress.  2. 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  3. Class 3 severe obesity with serious comorbidity and body mass index (BMI) of 40.0 to 44.9 in adult, unspecified obesity  type ([name] [name]) [name] [name] is currently in the action stage of change. As such, her goal is to continue with weight loss efforts. She has agreed to the Category 1 Plan.   Exercise goals: As is, and add arm resistance exercise.  Behavioral modification strategies: planning for success.  [name] [name] has agreed to follow-up with our clinic in 3 weeks. She was informed of the importance of frequent follow-up visits to maximize her success with intensive lifestyle modifications for her multiple health conditions.   Objective:   Blood pressure 124/82, pulse 74, temperature 98.1 F (36.7 C), temperature source Oral, height 5' (1.524 m), weight 211 lb (95.7 kg), SpO2 97 %. Body mass index is 41.21 kg/m.  General: Cooperative, alert, well developed, in no acute distress. HEENT: Conjunctivae and lids unremarkable. Cardiovascular: Regular rhythm.  Lungs: Normal work of breathing. Neurologic: No focal deficits.   Lab Results  Component Value Date   CREATININE 0.90 05/02/2019   BUN 11 05/02/2019   NA 138 05/02/2019   K 4.2 05/02/2019   CL 102 05/02/2019   CO2 23 05/02/2019   Lab Results  Component Value Date   ALT 14 05/02/2019   AST 24 05/02/2019   ALKPHOS 58 05/02/2019   BILITOT 0.3 05/02/2019   Lab Results  Component Value Date   HGBA1C 4.8 05/02/2019   HGBA1C 4.7 04/11/2014   HGBA1C 5.1 05/02/2013   Lab Results  Component Value Date   INSULIN 14.6 08/13/2019   INSULIN 19.9 05/02/2019   Lab Results  Component Value Date   TSH 1.930 05/02/2019   Lab Results  Component Value Date   CHOL 172 05/02/2019   HDL 61 05/02/2019   LDLCALC 92 05/02/2019   TRIG 109 05/02/2019   Lab Results  Component Value Date   WBC 6.0 05/02/2019   HGB 14.0 05/02/2019   HCT 42.1 05/02/2019   MCV 100 (H) 05/02/2019   PLT 262 05/02/2019   Lab Results  Component Value Date   IRON 101 05/02/2019   TIBC 333 05/02/2019   FERRITIN 71 05/02/2019   Attestation Statements:   Reviewed by  clinician on day of visit: allergies, medications, problem list, medical history, surgical history, family history, social history, and previous encounter notes.   Wilhemena Durie, am acting as Location manager for [employer], FNP-C.  I have reviewed the above documentation for accuracy and completeness, and I agree with the above. -  Georgianne Fick, FNP

## 2019-12-04 NOTE — Progress Notes (Signed)
44 y.o. [id number] Single White or Caucasian Not Hispanic or Latino female here for annual exam. Patient states that nuvaring has switch to a generic and her insurance is not covering it.    She was seen in 4/21 for vasomotor symptoms and irregular cycles. H/O tubal ligation. She was started on the nuvaring instead of OCP's secondary to h/o a gastric sleeve.  The nuvaring has regulated her cycles and helped with vasomotor symptoms. Now just with occasional night sweats.  She has the nuvaring in now, some trouble getting it from the pharmacy. No migraines since she started the nuvaring (no h/o auras). Sexually active, same partner x 11 years, don't live together. No dyspareunia.  Period Cycle (Days): 28 Period Duration (Days): 3-4 Menstrual Flow: Light Dysmenorrhea: (!) Mild Dysmenorrhea Symptoms: Cramping   She broke some bones in her foot last week, just found out this am. She was put in a boot.   She is going to the weight loss clinic, so far she has lost 20 lbs.   Patient's last menstrual period was 11/25/2019.          Sexually active: Yes.    The current method of family planning is NuvaRing vaginal inserts.    Exercising: No.  The patient does not participate in regular exercise at present. Smoker:  no  Health Maintenance: Pap: long time History of abnormal Pap:  no MMG:  08/21/19 U/S right breast density B Birads 2 benign  BMD:   None  Colonoscopy: none  TDaP:  02/11/15  Gardasil: NA   reports that she has never smoked. She has never used smokeless tobacco. She reports current alcohol use. She reports that she does not use drugs. She is a Librarian, academic for [employer].  Kids are 25 and 16. Has a 33 year old grandson.  Past Medical History:  Diagnosis Date  . Allergy   . Arthritis   . Asthma   . Back pain   . Edema, lower extremity   . Food allergy   . Joint pain   . Migraine without aura    migraine  . Obesity   . Plantar fasciitis   . PONV (postoperative nausea  and vomiting)   . Ruptured disc, cervical   . Shortness of breath   . Sleep apnea 08-16-13    mild no cpap needed per sleep study results    Past Surgical History:  Procedure Laterality Date  . FOOT NEUROMA SURGERY Right 2013  . KNEE ARTHROSCOPY Left 9-10 yrs ago  . 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 left foot surgery    . SHOULDER SURGERY    . TOE SURGERY  03/22/2018   cyst off of left toe bone removed  . TUBAL LIGATION  2004  . UPPER GI ENDOSCOPY  10/29/2013   Procedure: UPPER GI ENDOSCOPY;  Surgeon: [name], MD;  Location: [facility];  Service: General;;    Current Outpatient Medications  Medication Sig Dispense Refill  . albuterol (PROVENTIL) (2.5 MG/3ML) 0.083% nebulizer solution Take 3 mLs (2.5 mg total) by nebulization every 6 (six) hours as needed for wheezing or shortness of breath. 150 mL 1  . albuterol (VENTOLIN HFA) 108 (90 Base) MCG/ACT inhaler Inhale 2 puffs into the lungs every 6 (six) hours as needed for wheezing or shortness of breath. 18 g  0  . Biotin 1000 MCG CHEW Chew by mouth.    . calcium citrate-vitamin D (CITRACAL+D) 315-200 MG-UNIT tablet Take by mouth.    . Cholecalciferol (VITAMIN D3) 125 MCG (5000 UT) CAPS Take 1 capsule (5,000 Units total) by mouth daily. 30 capsule 0  . clobetasol (TEMOVATE) 0.05 % external solution SMARTSIG:1 Sparingly Topical Twice Daily PRN    . cyclobenzaprine (FLEXERIL) 10 MG tablet TAKE 1 TABLET (10 MG TOTAL) BY MOUTH TWO (2) TIMES A DAY AS NEEDED FOR MUSCLE SPASMS.  0  . doxycycline (VIBRA-TABS) 100 MG tablet Take 1 tablet (100 mg total) by mouth 2 (two) times daily. 20 tablet 0  . etonogestrel-ethinyl estradiol (NUVARING) 0.12-0.015 MG/24HR vaginal ring Place 1 each vaginally every 28 (twenty-eight) days. Insert vaginally and leave in place  for 3 consecutive weeks, then remove for 1 week. 1 each 1  . Fluocinonide 0.1 % CREA Apply topically daily as needed.    . metFORMIN (GLUCOPHAGE) 500 MG tablet Take 1 tablet (500 mg total) by mouth daily. 30 tablet 0  . Multiple Vitamin (MULTIVITAMIN) capsule Take by mouth.     Current Facility-Administered Medications  Medication Dose Route Frequency Provider Last Rate Last Admin  . albuterol (PROVENTIL) (2.5 MG/3ML) 0.083% nebulizer solution 2.5 mg  2.5 mg Nebulization Once Le, Thao P, DO        Family History  Problem Relation Age of Onset  . Lung disease Father   . Pulmonary embolism Father   . Diabetes Father   . Depression Father   . Anxiety disorder Father   . Thyroid disease Mother   . COPD Other   . Diabetes Other     Review of Systems  All other systems reviewed and are negative.   Exam:   BP 122/64   Pulse 96   Ht 5' (1.524 m)   LMP 11/25/2019   SpO2 99%   BMI 42.40 kg/m   Weight change: @WEIGHTCHANGE @ Height:   Height: 5' (152.4 cm)  Ht Readings from Last 3 Encounters:  12/11/19 5' (1.524 m)  12/10/19 5' (1.524 m)  12/05/19 5' (1.524 m)    General appearance: alert, cooperative and appears stated age Head: Normocephalic, without obvious abnormality, atraumatic Neck: no adenopathy, supple, symmetrical, trachea midline and thyroid normal to inspection and palpation Lungs: clear to auscultation bilaterally Cardiovascular: regular rate and rhythm Breasts: normal appearance, no masses or tenderness Abdomen: soft, non-tender; non distended,  no masses,  no organomegaly Extremities: extremities normal, atraumatic, no cyanosis or edema Skin: Skin color, texture, turgor normal. No rashes or lesions Lymph nodes: Cervical, supraclavicular, and axillary nodes normal. No abnormal inguinal nodes palpated Neurologic: Grossly normal   Pelvic: External genitalia:  no lesions              Urethra:  normal appearing urethra with no masses, tenderness or lesions               Bartholins and Skenes: normal                 Vagina: normal appearing vagina with normal color and discharge, no lesions              Cervix: no lesions               Bimanual Exam:  Uterus:  normal size, contour, position, consistency, mobility, non-tender and anteverted              Adnexa: no mass, fullness, tenderness  Rectovaginal: Confirms               Anus:  normal sphincter tone, no lesions  Shanon Petty chaperoned for the exam.  A:  Well Woman with normal exam  Vasomotor symptoms and cycle changes improved with the nuvaring  P:   Pap with hpv  Labs with primary   Mammogram UTD  Discussed breast self exam  Discussed calcium and vit D intake  Continue the nuvaring

## 2019-12-05 ENCOUNTER — Ambulatory Visit: Payer: 59 | Admitting: Family Medicine

## 2019-12-05 ENCOUNTER — Other Ambulatory Visit: Payer: Self-pay

## 2019-12-05 ENCOUNTER — Encounter: Payer: Self-pay | Admitting: Family Medicine

## 2019-12-05 VITALS — BP 113/77 | HR 90 | Temp 98.2°F | Ht 60.0 in | Wt 216.0 lb

## 2019-12-05 DIAGNOSIS — L03012 Cellulitis of left finger: Secondary | ICD-10-CM | POA: Diagnosis not present

## 2019-12-05 DIAGNOSIS — Z23 Encounter for immunization: Secondary | ICD-10-CM

## 2019-12-05 MED ORDER — DOXYCYCLINE HYCLATE 100 MG PO TABS: 100.0000 mg | ORAL_TABLET | Freq: Two times a day (BID) | ORAL | 0 refills | Status: DC

## 2019-12-05 NOTE — Progress Notes (Signed)
Patient ID: Princes Finger, female    DOB: 01-Mar-1976  Age: 44 y.o. MRN: [id number]  Chief Complaint  Patient presents with  . cut on finger    Pt has a 26mm cut on her L hand on the tip of the middle finger. pt reports she cut it 2 weeks ago while prepairing food. Cut apears mostly healed with some inflamation. Pt reports the area is extreamly sore/tender to the touch. Pr reports she has done her best to keep the area clean. Pt wants provder to know a week after she cut her finger the pt was prepairing chicken without gloves and that the cut was more open then. pt reports 2 days after that was when the finger became inflamed and tender.    Subjective:   As noted above the patient had cut her finger initially when she was cutting up some watermelon with a sharp knife.  Later she worked fixing some chicken with bare hands, and subsequently it is gotten infected.  The pain has been increasing the last few days.  No drainage.  Current allergies, medications, problem list, past/family and social histories reviewed.  Based on the old chart the patient had her last tetanus vaccine in November 2016 which is just under 5 years ago.  Objective:  BP 113/77   Pulse 90   Temp 98.2 F (36.8 C) (Temporal)   Ht 5' (1.524 m)   Wt 216 lb (98 kg)   SpO2 100%   BMI 42.18 kg/m   On the left middle finger adjacent to the medial part of the fingertip around along the cuticle it is moderately red and swollen and extremely tender.  Procedure note: I&D was performed of the small abscessed area using an 11 blade.  Prep was done with iodine and alcohol, it was anesthetized with ethyl chloride, and a small I&D performed.  Red blood came out but no obvious pus was expressed.  Culture was taken and sent.  Patient tolerated this well.  She was instructed in its care.  Assessment & Plan:   Assessment: 1. Abscess around nail of left middle finger   2. Need for prophylactic vaccination and inoculation  against influenza       Plan: See instructions  Orders Placed This Encounter  Procedures  . Flu Vaccine QUAD 36+ mos IM    No orders of the defined types were placed in this encounter.        Patient Instructions    Take doxycycline 100 mg 1 twice daily with food for infection  Take Tylenol 500 mg 2 pills 3 times daily and/or ibuprofen 200 mg 3 pills 3 times daily as needed for pain.  You can take both if necessary.  Return if worse or not improving  Keep it clean.  You may wish to soak it in a little cup of warm water several times daily which can bring it down I had if needed.  Always wear gloves when working with food if you have a wound on your hand, which you do now from this tiny incision.  Return or go to an urgent care or ER if needed.  Your last tetanus vaccine was just under 5 years ago.  If you have lab work done today you will be contacted with your lab results within the next 2 weeks.  If you have not heard from [place] then please contact us. The fastest way to get your results is to register for My Chart.   IF  you received an x-ray today, you will receive an invoice from [facility]. Please contact [facility] at [phone] with questions or concerns regarding your invoice.   IF you received labwork today, you will receive an invoice from [place]. Please contact [facility] at [phone] with questions or concerns regarding your invoice.   Our billing staff will not be able to assist you with questions regarding bills from these companies.  You will be contacted with the lab results as soon as they are available. The fastest way to get your results is to activate your My Chart account. Instructions are located on the last page of this paperwork. If you have not heard from [place] regarding the results in 2 weeks, please contact this office.        Return if symptoms worsen or fail to improve.   Ruben Reason, MD 12/05/2019

## 2019-12-05 NOTE — Patient Instructions (Addendum)
  Take doxycycline 100 mg 1 twice daily with food for infection  Take Tylenol 500 mg 2 pills 3 times daily and/or ibuprofen 200 mg 3 pills 3 times daily as needed for pain.  You can take both if necessary.  Return if worse or not improving  Keep it clean.  You may wish to soak it in a little cup of warm water several times daily which can bring it down I had if needed.  Always wear gloves when working with food if you have a wound on your hand, which you do now from this tiny incision.  Return or go to an urgent care or ER if needed.  Your last tetanus vaccine was just under 5 years ago.  If you have lab work done today you will be contacted with your lab results within the next 2 weeks.  If you have not heard from [place] then please contact us. The fastest way to get your results is to register for My Chart.   IF you received an x-ray today, you will receive an invoice from [facility]. Please contact [facility] at [phone] with questions or concerns regarding your invoice.   IF you received labwork today, you will receive an invoice from [place]. Please contact [facility] at [phone] with questions or concerns regarding your invoice.   Our billing staff will not be able to assist you with questions regarding bills from these companies.  You will be contacted with the lab results as soon as they are available. The fastest way to get your results is to activate your My Chart account. Instructions are located on the last page of this paperwork. If you have not heard from [place] regarding the results in 2 weeks, please contact this office.

## 2019-12-05 NOTE — Addendum Note (Signed)
Addended by: Abishai Viegas H on: 12/05/2019 03:44 PM   Modules accepted: Orders

## 2019-12-06 NOTE — Addendum Note (Signed)
Addended by: Meredeth Ide on: 12/06/2019 01:58 PM   Modules accepted: Orders

## 2019-12-10 ENCOUNTER — Ambulatory Visit: Payer: 59 | Admitting: Registered Nurse

## 2019-12-10 ENCOUNTER — Encounter: Payer: Self-pay | Admitting: Registered Nurse

## 2019-12-10 ENCOUNTER — Other Ambulatory Visit: Payer: Self-pay

## 2019-12-10 ENCOUNTER — Other Ambulatory Visit (INDEPENDENT_AMBULATORY_CARE_PROVIDER_SITE_OTHER): Payer: Self-pay | Admitting: Physician Assistant

## 2019-12-10 VITALS — BP 109/76 | HR 86 | Temp 98.0°F | Resp 18 | Ht 60.0 in | Wt 217.1 lb

## 2019-12-10 DIAGNOSIS — E8881 Metabolic syndrome: Secondary | ICD-10-CM

## 2019-12-10 DIAGNOSIS — Z6841 Body Mass Index (BMI) 40.0 and over, adult: Secondary | ICD-10-CM | POA: Diagnosis not present

## 2019-12-10 DIAGNOSIS — Z7689 Persons encountering health services in other specified circumstances: Secondary | ICD-10-CM

## 2019-12-10 DIAGNOSIS — Z9884 Bariatric surgery status: Secondary | ICD-10-CM | POA: Diagnosis not present

## 2019-12-10 DIAGNOSIS — E88819 Insulin resistance, unspecified: Secondary | ICD-10-CM

## 2019-12-10 LAB — WOUND CULTURE: Organism ID, Bacteria: NONE SEEN

## 2019-12-10 NOTE — Progress Notes (Signed)
Established Patient Office Visit  Subjective:  Patient ID: [name] [name], female    DOB: 10-23-1975  Age: 44 y.o. MRN: [id number]  CC:  Chief Complaint  Patient presents with  . Transitions Of Care    Patient states she is here for a transfer of care , and has no questions or concerns today    HPI [name] [name] presents for visit to establish care.  No specific concerns or complaints today  Histories reviewed. No updates needed at this time  [facility] s/p gastric sleeve. Down around 70lb from highest weight. Feels very good about this. 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  Past Medical History:  Diagnosis Date  . Allergy   . Arthritis   . Asthma   . Back pain   . Edema, lower extremity   . Food allergy   . Joint pain   . Migraine without aura    migraine  . Obesity   . Plantar fasciitis   . PONV (postoperative nausea and vomiting)   . Ruptured disc, cervical   . Shortness of breath   . Sleep apnea 08-16-13    mild no cpap needed per sleep study results    Past Surgical History:  Procedure Laterality Date  . FOOT NEUROMA SURGERY Right 2013  . KNEE ARTHROSCOPY Left 9-10 yrs ago  . 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 left foot surgery    . SHOULDER SURGERY    . TOE SURGERY  03/22/2018   cyst off of left toe bone removed  . TUBAL LIGATION  2004  . UPPER GI ENDOSCOPY  10/29/2013   Procedure: UPPER GI ENDOSCOPY;  Surgeon: [name], MD;  Location: [facility];  Service: General;;    Family History  Problem Relation Age of Onset  . Lung disease Father   . Pulmonary embolism Father   . Diabetes Father   . Depression  Father   . Anxiety disorder Father   . Thyroid disease Mother   . COPD Other   . Diabetes Other     Social History   Socioeconomic History  . Marital status: Significant Other    Spouse name: Not on file  . Number of children: 2  . Years of education: Not on file  . Highest education level: Not on file  Occupational History  . Occupation: Immunologist  Tobacco Use  . Smoking status: Never Smoker  . Smokeless tobacco: Never Used  Vaping Use  . Vaping Use: Never used  Substance and Sexual Activity  . Alcohol use: Yes    Comment: occasionally  . Drug use: No  . Sexual activity: Yes    Birth control/protection: Surgical  Other Topics Concern  . Not on file  Social History Narrative  . Not on file   Social Determinants of Health   Financial Resource Strain:   . Difficulty of Paying Living Expenses: Not on file  Food Insecurity:   . Worried About Charity fundraiser in the Last Year: Not on file  . Ran Out of Food in the Last Year: Not on file  Transportation Needs:   . Lack of Transportation (Medical): Not on file  .  Lack of Transportation (Non-Medical): Not on file  Physical Activity:   . Days of Exercise per Week: Not on file  . Minutes of Exercise per Session: Not on file  Stress:   . Feeling of Stress : Not on file  Social Connections:   . Frequency of Communication with Friends and Family: Not on file  . Frequency of Social Gatherings with Friends and Family: Not on file  . Attends Religious Services: Not on file  . Active Member of Clubs or Organizations: Not on file  . Attends Archivist Meetings: Not on file  . Marital Status: Not on file  Intimate Partner Violence:   . Fear of Current or Ex-Partner: Not on file  . Emotionally Abused: Not on file  . Physically Abused: Not on file  . Sexually Abused: Not on file    Outpatient Medications Prior to Visit  Medication Sig Dispense Refill  . albuterol (PROVENTIL) (2.5 MG/3ML)  0.083% nebulizer solution Take 3 mLs (2.5 mg total) by nebulization every 6 (six) hours as needed for wheezing or shortness of breath. 150 mL 1  . albuterol (VENTOLIN HFA) 108 (90 Base) MCG/ACT inhaler Inhale 2 puffs into the lungs every 6 (six) hours as needed for wheezing or shortness of breath. 18 g 0  . Biotin 1000 MCG CHEW Chew by mouth.    . calcium citrate-vitamin D (CITRACAL+D) 315-200 MG-UNIT tablet Take by mouth.    . Cholecalciferol (VITAMIN D3) 125 MCG (5000 UT) CAPS Take 1 capsule (5,000 Units total) by mouth daily. 30 capsule 0  . clobetasol (TEMOVATE) 0.05 % external solution SMARTSIG:1 Sparingly Topical Twice Daily PRN    . cyclobenzaprine (FLEXERIL) 10 MG tablet TAKE 1 TABLET (10 MG TOTAL) BY MOUTH TWO (2) TIMES A DAY AS NEEDED FOR MUSCLE SPASMS.  0  . doxycycline (VIBRA-TABS) 100 MG tablet Take 1 tablet (100 mg total) by mouth 2 (two) times daily. 20 tablet 0  . etonogestrel-ethinyl estradiol (NUVARING) 0.12-0.015 MG/24HR vaginal ring Place 1 each vaginally every 28 (twenty-eight) days. Insert vaginally and leave in place for 3 consecutive weeks, then remove for 1 week. 1 each 1  . Fluocinonide 0.1 % CREA Apply topically daily as needed.    . metFORMIN (GLUCOPHAGE) 500 MG tablet Take 1 tablet (500 mg total) by mouth daily. 30 tablet 0  . Multiple Vitamin (MULTIVITAMIN) capsule Take by mouth.     Facility-Administered Medications Prior to Visit  Medication Dose Route Frequency Provider Last Rate Last Admin  . albuterol (PROVENTIL) (2.5 MG/3ML) 0.083% nebulizer solution 2.5 mg  2.5 mg Nebulization Once Le, Thao P, DO        Allergies  Allergen Reactions  . Other Other (See Comments)    Nut allergy rash, tongue swells  . Oxycodone   . Amoxicillin Rash  . Codeine Nausea And Vomiting and Rash  . Tramadol Nausea And Vomiting and Rash    ROS Review of Systems    Objective:    Physical Exam Constitutional:      General: She is not in acute distress.    Appearance:  Normal appearance. She is obese. She is not ill-appearing, toxic-appearing or diaphoretic.  Cardiovascular:     Rate and Rhythm: Normal rate and regular rhythm.  Pulmonary:     Effort: Pulmonary effort is normal. No respiratory distress.  Neurological:     General: No focal deficit present.     Mental Status: She is alert and oriented to person, place, and time. Mental status  is at baseline.  Psychiatric:        Mood and Affect: Mood normal.        Behavior: Behavior normal.        Thought Content: Thought content normal.        Judgment: Judgment normal.     BP 109/76   Pulse 86   Temp 98 F (36.7 C) (Temporal)   Resp 18   Ht 5' (1.524 m)   Wt 217 lb 1.6 oz (98.5 kg)   SpO2 98%   BMI 42.40 kg/m  Wt Readings from Last 3 Encounters:  12/10/19 217 lb 1.6 oz (98.5 kg)  12/05/19 216 lb (98 kg)  11/26/19 211 lb (95.7 kg)     Health Maintenance Due  Topic Date Due  . PAP SMEAR-Modifier  02/02/2020    There are no preventive care reminders to display for this patient.  Lab Results  Component Value Date   TSH 1.930 05/02/2019   Lab Results  Component Value Date   WBC 6.0 05/02/2019   HGB 14.0 05/02/2019   HCT 42.1 05/02/2019   MCV 100 (H) 05/02/2019   PLT 262 05/02/2019   Lab Results  Component Value Date   NA 138 05/02/2019   K 4.2 05/02/2019   CO2 23 05/02/2019   GLUCOSE 80 05/02/2019   BUN 11 05/02/2019   CREATININE 0.90 05/02/2019   BILITOT 0.3 05/02/2019   ALKPHOS 58 05/02/2019   AST 24 05/02/2019   ALT 14 05/02/2019   PROT 7.1 05/02/2019   ALBUMIN 4.6 05/02/2019   CALCIUM 9.5 05/02/2019   ANIONGAP 12 10/23/2013   Lab Results  Component Value Date   CHOL 172 05/02/2019   Lab Results  Component Value Date   HDL 61 05/02/2019   Lab Results  Component Value Date   LDLCALC 92 05/02/2019   Lab Results  Component Value Date   TRIG 109 05/02/2019   No results found for: CHOLHDL Lab Results  Component Value Date   HGBA1C 4.8 05/02/2019       Assessment & Plan:   Problem List Items Addressed This Visit      Other   Class 3 severe obesity with serious comorbidity and body mass index (BMI) of 40.0 to 44.9 in adult [facility])   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   Follow-up: No follow-ups on file.   PLAN  Discussed diet and exercise with patient  Discussed maintaining weight loss vs. Actively losing weight - maintenance is ok when work is so busy, as long as she is not gaining weight, this can be a victory.  Return in February for CPE and labs  Patient encouraged to call clinic with any questions, comments, or concerns.  Maximiano Coss, NP

## 2019-12-10 NOTE — Patient Instructions (Signed)
° ° ° °  If you have lab work done today you will be contacted with your lab results within the next 2 weeks.  If you have not heard from us then please contact us. The fastest way to get your results is to register for My Chart. ° ° °IF you received an x-ray today, you will receive an invoice from [facility]. Please contact [facility] at [phone] with questions or concerns regarding your invoice.  ° °IF you received labwork today, you will receive an invoice from [facility]. Please contact [facility] at [phone] with questions or concerns regarding your invoice.  ° °Our billing staff will not be able to assist you with questions regarding bills from these companies. ° °You will be contacted with the lab results as soon as they are available. The fastest way to get your results is to activate your My Chart account. Instructions are located on the last page of this paperwork. If you have not heard from us regarding the results in 2 weeks, please contact this office. °  ° ° ° °

## 2019-12-11 ENCOUNTER — Ambulatory Visit: Payer: 59 | Admitting: Obstetrics and Gynecology

## 2019-12-11 ENCOUNTER — Other Ambulatory Visit (HOSPITAL_COMMUNITY)
Admission: RE | Admit: 2019-12-11 | Discharge: 2019-12-11 | Disposition: A | Discharge status: Home / Self Care | Payer: 59 | Source: Ambulatory Visit | Attending: Obstetrics and Gynecology | Admitting: Obstetrics and Gynecology

## 2019-12-11 ENCOUNTER — Encounter: Payer: Self-pay | Admitting: Obstetrics and Gynecology

## 2019-12-11 ENCOUNTER — Encounter (INDEPENDENT_AMBULATORY_CARE_PROVIDER_SITE_OTHER): Payer: Self-pay

## 2019-12-11 VITALS — BP 122/64 | HR 96 | Ht 60.0 in

## 2019-12-11 DIAGNOSIS — Z01419 Encounter for gynecological examination (general) (routine) without abnormal findings: Secondary | ICD-10-CM | POA: Diagnosis not present

## 2019-12-11 DIAGNOSIS — Z124 Encounter for screening for malignant neoplasm of cervix: Secondary | ICD-10-CM | POA: Insufficient documentation

## 2019-12-11 DIAGNOSIS — Z3044 Encounter for surveillance of vaginal ring hormonal contraceptive device: Secondary | ICD-10-CM | POA: Diagnosis not present

## 2019-12-11 MED ORDER — ETONOGESTREL-ETHINYL ESTRADIOL 0.12-0.015 MG/24HR VA RING
1.0000 | VAGINAL_RING | VAGINAL | 3 refills | Status: DC
Start: 2019-12-11 — End: 2020-06-17

## 2019-12-11 NOTE — Patient Instructions (Signed)
EXERCISE AND DIET:  We recommended that you start or continue a regular exercise program for good health. Regular exercise means any activity that makes your heart beat faster and makes you sweat.  We recommend exercising at least 30 minutes per day at least 3 days a week, preferably 4 or 5.  We also recommend a diet low in fat and sugar.  Inactivity, poor dietary choices and obesity can cause diabetes, heart attack, stroke, and kidney damage, among others.    ALCOHOL AND SMOKING:  Women should limit their alcohol intake to no more than 7 drinks/beers/glasses of wine (combined, not each!) per week. Moderation of alcohol intake to this level decreases your risk of breast cancer and liver damage. And of course, no recreational drugs are part of a healthy lifestyle.  And absolutely no smoking or even second hand smoke. Most people know smoking can cause heart and lung diseases, but did you know it also contributes to weakening of your bones? Aging of your skin?  Yellowing of your teeth and nails?  CALCIUM AND VITAMIN D:  Adequate intake of calcium and Vitamin D are recommended.  The recommendations for exact amounts of these supplements seem to change often, but generally speaking 1,000 mg of calcium (between diet and supplement) and 800 units of Vitamin D per day seems prudent. Certain women may benefit from higher intake of Vitamin D.  If you are among these women, your doctor will have told you during your visit.    PAP SMEARS:  Pap smears, to check for cervical cancer or precancers,  have traditionally been done yearly, although recent scientific advances have shown that most women can have pap smears less often.  However, every woman still should have a physical exam from her gynecologist every year. It will include a breast check, inspection of the vulva and vagina to check for abnormal growths or skin changes, a visual exam of the cervix, and then an exam to evaluate the size and shape of the uterus and  ovaries. a breast check, inspection of the vulva and vagina to check for abnormal growths or skin changes, a visual exam of the cervix, and then an exam to evaluate the size and shape of the uterus and  ovaries.  And after 45 years of age, a rectal exam is indicated to check for rectal cancers., a rectal exam is indicated to check for rectal cancers. We will also provide age appropriate advice regarding health maintenance, like when you should have certain vaccines, screening for sexually transmitted diseases, bone density testing, colonoscopy, mammograms, etc.   MAMMOGRAMS:  All women over 45 years old should have a yearly mammogram. old should have a yearly mammogram. Many facilities now offer a "3D" mammogram, which may cost around $50 extra out of pocket. If possible,  we recommend you accept the option to have the 3D mammogram performed.  It both reduces the number of women who will be called back for extra views which then turn out to be normal, and it is better than the routine mammogram at detecting truly abnormal areas.    COLON CANCER SCREENING: Now recommend starting at age 45. At this time colonoscopy is not covered for routine screening until 50. There are take home tests that can be done between 45-45.   COLONOSCOPY:  Colonoscopy to screen for colon cancer is recommended for all women at age 45.  We know, you hate the idea of the prep.  We agree, BUT, having colon cancer and not knowing it is worse!!  Colon cancer so often starts as a polyp that can be seen and removed at colonscopy, which can quite literally save your life!  And if your first colonoscopy is normal and you have no family history of colon cancer, most women don't have to have it again for  10 years.  Once every ten years, you can do something that may end up saving your life, right?  We will be happy to help you get it scheduled when you are ready.  Be sure to check your insurance coverage so you understand how much it will cost.  It may be covered as a preventative service at no cost, but you should check your particular policy.   ° ° ° °Breast Self-Awareness °Breast self-awareness means being familiar with how your breasts look and feel. It involves checking your breasts regularly and reporting any changes to your  health care provider. °Practicing breast self-awareness is important. A change in your breasts can be a sign of a serious medical problem. Being familiar with how your breasts look and feel allows you to find any problems early, when treatment is more likely to be successful. All women should practice breast self-awareness, including women who have had breast implants. °How to do a breast self-exam °One way to learn what is normal for your breasts and whether your breasts are changing is to do a breast self-exam. To do a breast self-exam: °Look for Changes ° °1. Remove all the clothing above your waist. °2. Stand in front of a mirror in a room with good lighting. °3. Put your hands on your hips. °4. Push your hands firmly downward. °5. Compare your breasts in the mirror. Look for differences between them (asymmetry), such as: °? Differences in shape. °? Differences in size. °? Puckers, dips, and bumps in one breast and not the other. °6. Look at each breast for changes in your skin, such as: °? Redness. °? Scaly areas. °7. Look for changes in your nipples, such as: °? Discharge. °? Bleeding. °? Dimpling. °? Redness. °? A change in position. °Feel for Changes °Carefully feel your breasts for lumps and changes. It is best to do this while lying on your back on the floor and again while sitting or standing in the shower or tub with soapy water on your skin. Feel each breast in the following way: °· Place the arm on the side of the breast you are examining above your head. °· Feel your breast with the other hand. °· Start in the nipple area and make ¾ inch (2 cm) overlapping circles to feel your breast. Use the pads of your three middle fingers to do this. Apply light pressure, then medium pressure, then firm pressure. The light pressure will allow you to feel the tissue closest to the skin. The medium pressure will allow you to feel the tissue that is a little deeper. The firm pressure will allow you to feel the tissue  close to the ribs. °· Continue the overlapping circles, moving downward over the breast until you feel your ribs below your breast. °· Move one finger-width toward the center of the body. Continue to use the ¾ inch (2 cm) overlapping circles to feel your breast as you move slowly up toward your collarbone. °· Continue the up and down exam using all three pressures until you reach your armpit. ° °Write Down What You Find ° °Write down what is normal for each breast and any changes that you find. Keep a written record with breast changes or normal findings for each breast. By writing this information down, you do not need to depend only on memory for size, tenderness, or location. Write down where you are in your menstrual cycle, if you are still menstruating. °If you are having trouble noticing differences   in your breasts, do not get discouraged. With time you will become more familiar with the variations in your breasts and more comfortable with the exam. How often should I examine my breasts? Examine your breasts every month. If you are breastfeeding, the best time to examine your breasts is after a feeding or after using a breast pump. If you menstruate, the best time to examine your breasts is 5-7 days after your period is over. During your period, your breasts are lumpier, and it may be more difficult to notice changes. When should I see my health care provider? See your health care provider if you notice:  A change in shape or size of your breasts or nipples.  A change in the skin of your breast or nipples, such as a reddened or scaly area.  Unusual discharge from your nipples.  A lump or thick area that was not there before.  Pain in your breasts.  Anything that concerns you.  

## 2019-12-11 NOTE — Telephone Encounter (Signed)
My chart message sent to pt.

## 2019-12-12 LAB — CYTOLOGY - PAP
Comment: NEGATIVE
Diagnosis: NEGATIVE
High risk HPV: NEGATIVE

## 2019-12-17 ENCOUNTER — Encounter (INDEPENDENT_AMBULATORY_CARE_PROVIDER_SITE_OTHER): Payer: Self-pay | Admitting: Family Medicine

## 2019-12-17 ENCOUNTER — Ambulatory Visit (INDEPENDENT_AMBULATORY_CARE_PROVIDER_SITE_OTHER): Payer: 59 | Admitting: Family Medicine

## 2019-12-17 ENCOUNTER — Other Ambulatory Visit: Payer: Self-pay

## 2019-12-17 VITALS — BP 123/83 | HR 71 | Temp 98.2°F | Ht 60.0 in | Wt 212.0 lb

## 2019-12-17 DIAGNOSIS — Z9189 Other specified personal risk factors, not elsewhere classified: Secondary | ICD-10-CM | POA: Diagnosis not present

## 2019-12-17 DIAGNOSIS — F3289 Other specified depressive episodes: Secondary | ICD-10-CM | POA: Diagnosis not present

## 2019-12-17 DIAGNOSIS — E66813 Obesity, class 3: Secondary | ICD-10-CM

## 2019-12-17 DIAGNOSIS — Z6841 Body Mass Index (BMI) 40.0 and over, adult: Secondary | ICD-10-CM

## 2019-12-17 DIAGNOSIS — E8881 Metabolic syndrome: Secondary | ICD-10-CM

## 2019-12-17 DIAGNOSIS — E559 Vitamin D deficiency, unspecified: Secondary | ICD-10-CM

## 2019-12-17 DIAGNOSIS — E88819 Insulin resistance, unspecified: Secondary | ICD-10-CM

## 2019-12-17 MED ORDER — BUPROPION HCL ER (SR) 150 MG PO TB12: 150.0000 mg | ORAL_TABLET | Freq: Every day | ORAL | 0 refills | Status: DC

## 2019-12-17 NOTE — Progress Notes (Signed)
Chief Complaint:   OBESITY [name] [name] is here to discuss her progress with her obesity treatment plan along with follow-up of her obesity related diagnoses. [name] [name] is on the Category 1 Plan and states she is following her eating plan approximately 80% of the time. [name] [name] states she is doing 0 minutes 0 times per week.  Today's visit was #: 14 Starting weight: 232 lbs Starting date: 05/02/2019 Today's weight: 212 lbs Today's date: 12/17/2019 Total lbs lost to date: 20 Total lbs lost since last in-office visit: 0  Interim History: [name] [name] has been unable to exercise since she fractured her left foot. She has been a bit depressed and she notes some stress eating due to her injury. She was overeating her snack calories.  Subjective:   1. Vitamin D deficiency Brayley's last Vit D level was at goal. She is on OTC Vit D 5,000 IU daily, and she denies fatigue.  2. Insulin resistance [name] [name] has a diagnosis of insulin resistance based on her elevated fasting insulin level >5. She is on metformin q AM, and she denies polyphagia. She continues to work on diet and exercise to decrease her risk of diabetes.  Lab Results  Component Value Date   INSULIN 14.6 08/13/2019   INSULIN 19.9 05/02/2019   Lab Results  Component Value Date   HGBA1C 4.8 05/02/2019   3. Other depression with emotional eating [name] [name] notes increased stress eating with recent foot fracture. She is not currently on any psychotropic medications. She admits to feeling somewhat depressed. No SI/HI.  4. At risk for side effect of medication [name] [name] is at risk for drug side effects due to start of bupropion.  Assessment/Plan:   1. Vitamin D deficiency  Continue OTC Vit D 5,000 IU daily. We will check labs today.   - VITAMIN D 25 Hydroxy (Vit-D Deficiency, Fractures)  2. Insulin resistance Continue metformin. Check labs today.  - Hemoglobin A1c - Insulin, random - Comprehensive metabolic  panel  3. Other depression with emotional eating  Ltanya agreed to start bupropion 150 mg q AM with no refills.  - buPROPion (WELLBUTRIN SR) 150 MG 12 hr tablet; Take 1 tablet (150 mg total) by mouth daily with breakfast.  Dispense: 30 tablet; Refill: 0  4. At risk for side effect of medication [name] [name] was given approximately 15 minutes of drug side effect counseling today.  We discussed side effect possibility (dry mputh, insomnia) and risk versus benefits. [name] [name] agreed to the medication and will contact this office if these side effects are intolerable.  Repetitive spaced learning was employed today to elicit superior memory formation and behavioral change.  5. Class 3 severe obesity with serious comorbidity and body mass index (BMI) of 40.0 to 44.9 in adult, unspecified obesity type ([place]) [name] [name] is currently in the action stage of change. As such, her goal is to continue with weight loss efforts. She has agreed to the Category 1 Plan.   Exercise goals: No exercise has been prescribed at this time.  Behavioral modification strategies: increasing lean protein intake and decreasing simple carbohydrates.  [name] [name] has agreed to follow-up with our clinic in 3 weeks. [name] [name] was informed we would discuss her lab results at her next visit unless there is a critical issue that needs to be addressed sooner. [name] [name] agreed to keep her next visit at the agreed upon time to discuss these results.  Objective:   Blood pressure 123/83, pulse 71, temperature 98.2 F (36.8 C), temperature source Oral, height 5' (1.524  m), weight 212 lb (96.2 kg), last menstrual period 11/25/2019, SpO2 98 %. Body mass index is 41.4 kg/m.  General: Cooperative, alert, well developed, in no acute distress. HEENT: Conjunctivae and lids unremarkable. Cardiovascular: Regular rhythm.  Lungs: Normal work of breathing. Neurologic: No focal deficits.   Lab Results  Component Value Date   CREATININE 0.90  05/02/2019   BUN 11 05/02/2019   NA 138 05/02/2019   K 4.2 05/02/2019   CL 102 05/02/2019   CO2 23 05/02/2019   Lab Results  Component Value Date   ALT 14 05/02/2019   AST 24 05/02/2019   ALKPHOS 58 05/02/2019   BILITOT 0.3 05/02/2019   Lab Results  Component Value Date   HGBA1C 4.8 05/02/2019   HGBA1C 4.7 04/11/2014   HGBA1C 5.1 05/02/2013   Lab Results  Component Value Date   INSULIN 14.6 08/13/2019   INSULIN 19.9 05/02/2019   Lab Results  Component Value Date   TSH 1.930 05/02/2019   Lab Results  Component Value Date   CHOL 172 05/02/2019   HDL 61 05/02/2019   LDLCALC 92 05/02/2019   TRIG 109 05/02/2019   Lab Results  Component Value Date   WBC 6.0 05/02/2019   HGB 14.0 05/02/2019   HCT 42.1 05/02/2019   MCV 100 (H) 05/02/2019   PLT 262 05/02/2019   Lab Results  Component Value Date   IRON 101 05/02/2019   TIBC 333 05/02/2019   FERRITIN 71 05/02/2019   Attestation Statements:   Reviewed by clinician on day of visit: allergies, medications, problem list, medical history, surgical history, family history, social history, and previous encounter notes.   Wilhemena Durie, am acting as Location manager for [employer], FNP-C.  I have reviewed the above documentation for accuracy and completeness, and I agree with the above. -  Georgianne Fick, FNP

## 2019-12-18 ENCOUNTER — Encounter (INDEPENDENT_AMBULATORY_CARE_PROVIDER_SITE_OTHER): Payer: Self-pay | Admitting: Family Medicine

## 2019-12-18 DIAGNOSIS — F32A Depression, unspecified: Secondary | ICD-10-CM | POA: Insufficient documentation

## 2019-12-18 LAB — COMPREHENSIVE METABOLIC PANEL
ALT: 10 IU/L (ref 0–32)
AST: 15 IU/L (ref 0–40)
Albumin/Globulin Ratio: 1.9 (ref 1.2–2.2)
Albumin: 4.3 g/dL (ref 3.8–4.8)
Alkaline Phosphatase: 38 IU/L — ABNORMAL LOW (ref 44–121)
BUN/Creatinine Ratio: 13 (ref 9–23)
BUN: 10 mg/dL (ref 6–24)
Bilirubin Total: 0.3 mg/dL (ref 0.0–1.2)
CO2: 23 mmol/L (ref 20–29)
Calcium: 8.7 mg/dL (ref 8.7–10.2)
Chloride: 107 mmol/L — ABNORMAL HIGH (ref 96–106)
Creatinine, Ser: 0.76 mg/dL (ref 0.57–1.00)
GFR calc Af Amer: 110 mL/min/{1.73_m2} (ref >59)
GFR calc non Af Amer: 95 mL/min/{1.73_m2} (ref >59)
Globulin, Total: 2.3 g/dL (ref 1.5–4.5)
Glucose: 79 mg/dL (ref 65–99)
Potassium: 4.8 mmol/L (ref 3.5–5.2)
Sodium: 143 mmol/L (ref 134–144)
Total Protein: 6.6 g/dL (ref 6.0–8.5)

## 2019-12-18 LAB — VITAMIN D 25 HYDROXY (VIT D DEFICIENCY, FRACTURES): Vit D, 25-Hydroxy: 62.8 ng/mL (ref 30.0–100.0)

## 2019-12-18 LAB — INSULIN, RANDOM: INSULIN: 12.8 u[IU]/mL (ref 2.6–24.9)

## 2019-12-18 LAB — HEMOGLOBIN A1C
Est. average glucose Bld gHb Est-mCnc: 82 mg/dL
Hgb A1c MFr Bld: 4.5 % — ABNORMAL LOW (ref 4.8–5.6)

## 2020-01-07 ENCOUNTER — Other Ambulatory Visit: Payer: Self-pay

## 2020-01-07 ENCOUNTER — Ambulatory Visit (INDEPENDENT_AMBULATORY_CARE_PROVIDER_SITE_OTHER): Payer: 59 | Admitting: Family Medicine

## 2020-01-07 ENCOUNTER — Encounter (INDEPENDENT_AMBULATORY_CARE_PROVIDER_SITE_OTHER): Payer: Self-pay | Admitting: Family Medicine

## 2020-01-07 VITALS — BP 103/77 | HR 88 | Temp 97.8°F | Ht 60.0 in | Wt 211.0 lb

## 2020-01-07 DIAGNOSIS — E559 Vitamin D deficiency, unspecified: Secondary | ICD-10-CM | POA: Diagnosis not present

## 2020-01-07 DIAGNOSIS — E8881 Metabolic syndrome: Secondary | ICD-10-CM | POA: Diagnosis not present

## 2020-01-07 DIAGNOSIS — F3289 Other specified depressive episodes: Secondary | ICD-10-CM

## 2020-01-07 DIAGNOSIS — Z6841 Body Mass Index (BMI) 40.0 and over, adult: Secondary | ICD-10-CM

## 2020-01-07 DIAGNOSIS — Z9189 Other specified personal risk factors, not elsewhere classified: Secondary | ICD-10-CM

## 2020-01-07 DIAGNOSIS — E88819 Insulin resistance, unspecified: Secondary | ICD-10-CM

## 2020-01-07 MED ORDER — BUPROPION HCL ER (SR) 150 MG PO TB12: 150.0000 mg | ORAL_TABLET | Freq: Every day | ORAL | 0 refills | Status: DC

## 2020-01-07 MED ORDER — METFORMIN HCL 500 MG PO TABS: 500.0000 mg | ORAL_TABLET | Freq: Every day | ORAL | 0 refills | Status: DC

## 2020-01-07 NOTE — Progress Notes (Signed)
Chief Complaint:   OBESITY Denver is here to discuss her progress with her obesity treatment plan along with follow-up of her obesity related diagnoses. Shallon is on the Category 1 Plan and states she is following her eating plan approximately 90% of the time. Lavilla states she is doing 0 minutes 0 times per week.  Today's visit was #: 14 Starting weight: 232 lbs Starting date: 05/02/2019 Today's weight: 211 lbs Today's date: 01/07/2020 Total lbs lost to date: 21 Total lbs lost since last in-office visit: 1  Interim History: Jazyiah has adhered well to the plan. She denies excessive hunger. She is eating all of the food on the plan. She has a foot fracture on her left foot and is wearing a boot so she has been unable to exercise.  Subjective:   1. Insulin resistance  Last A1c was very low at 4.5. Fasting insulin elevated at 12.8. She denies polyphagia.  Lab Results  Component Value Date   INSULIN 12.8 12/17/2019   INSULIN 14.6 08/13/2019   INSULIN 19.9 05/02/2019   Lab Results  Component Value Date   HGBA1C 4.5 (L) 12/17/2019   2. Vitamin D deficiency Tonji's last Vit D level was at goal. She is on 5,000 IU OTC Vit D daily. Her Vit D level is stable on OTC Vit D. I discussed labs with the patient today.  3. Other depression with emotional eating Whitlee feels bupropion helps with cravings. She denies insomnia, and her blood pressure is within normal limits.  4. At risk for diabetes mellitus Michale is at higher than average risk for developing diabetes due to her obesity, insulin resistance, and family history of diabetes mellitus.   Assessment/Plan:   1. Insulin resistance We will refill metformin for 1 month. - metFORMIN (GLUCOPHAGE) 500 MG tablet; Take 1 tablet (500 mg total) by mouth daily.  Dispense: 30 tablet; Refill: 0  2. Vitamin D deficiency  Tanise agreed to continue taking OTC Vitamin D 5,000 IU daily.  3. Other depression with  emotional eating . We will refill bupropion for 1 month.   - buPROPion (WELLBUTRIN SR) 150 MG 12 hr tablet; Take 1 tablet (150 mg total) by mouth daily with breakfast.  Dispense: 30 tablet; Refill: 0  4. At risk for diabetes mellitus Dreya was given approximately 15 minutes of diabetes education and counseling today. We discussed intensive lifestyle modifications today with an emphasis on weight loss as well as increasing exercise and decreasing simple carbohydrates in her diet. We also reviewed medication options with an emphasis on risk versus benefit of those discussed.   Repetitive spaced learning was employed today to elicit superior memory formation and behavioral change.  5. Class 3 severe obesity with serious comorbidity and body mass index (BMI) of 40.0 to 44.9 in adult, unspecified obesity type ([place]) Vianny is currently in the action stage of change. As such, her goal is to continue with weight loss efforts. She has agreed to the Category 1 Plan.   Exercise goals: No exercise has been prescribed at this time.  Behavioral modification strategies: planning for success.  Solymar has agreed to follow-up with our clinic in 3 weeks.  Objective:   Blood pressure 103/77, pulse 88, temperature 97.8 F (36.6 C), temperature source Oral, height 5' (1.524 m), weight 211 lb (95.7 kg), SpO2 97 %. Body mass index is 41.21 kg/m.  General: Cooperative, alert, well developed, in no acute distress. HEENT: Conjunctivae and lids unremarkable. Cardiovascular: Regular rhythm.  Lungs: Normal  work of breathing. Neurologic: No focal deficits.   Lab Results  Component Value Date   CREATININE 0.76 12/17/2019   BUN 10 12/17/2019   NA 143 12/17/2019   K 4.8 12/17/2019   CL 107 (H) 12/17/2019   CO2 23 12/17/2019   Lab Results  Component Value Date   ALT 10 12/17/2019   AST 15 12/17/2019   ALKPHOS 38 (L) 12/17/2019   BILITOT 0.3 12/17/2019   Lab Results  Component Value Date    HGBA1C 4.5 (L) 12/17/2019   HGBA1C 4.8 05/02/2019   HGBA1C 4.7 04/11/2014   HGBA1C 5.1 05/02/2013   Lab Results  Component Value Date   INSULIN 12.8 12/17/2019   INSULIN 14.6 08/13/2019   INSULIN 19.9 05/02/2019   Lab Results  Component Value Date   TSH 1.930 05/02/2019   Lab Results  Component Value Date   CHOL 172 05/02/2019   HDL 61 05/02/2019   LDLCALC 92 05/02/2019   TRIG 109 05/02/2019   Lab Results  Component Value Date   WBC 6.0 05/02/2019   HGB 14.0 05/02/2019   HCT 42.1 05/02/2019   MCV 100 (H) 05/02/2019   PLT 262 05/02/2019   Lab Results  Component Value Date   IRON 101 05/02/2019   TIBC 333 05/02/2019   FERRITIN 71 05/02/2019   Attestation Statements:   Reviewed by clinician on day of visit: allergies, medications, problem list, medical history, surgical history, family history, social history, and previous encounter notes.   Wilhemena Durie, am acting as Location manager for [employer], FNP-C.  I have reviewed the above documentation for accuracy and completeness, and I agree with the above. -  Georgianne Fick, FNP

## 2020-01-08 ENCOUNTER — Other Ambulatory Visit (INDEPENDENT_AMBULATORY_CARE_PROVIDER_SITE_OTHER): Payer: Self-pay | Admitting: Family Medicine

## 2020-01-08 DIAGNOSIS — F3289 Other specified depressive episodes: Secondary | ICD-10-CM

## 2020-01-24 ENCOUNTER — Telehealth (INDEPENDENT_AMBULATORY_CARE_PROVIDER_SITE_OTHER): Payer: 59 | Admitting: Registered Nurse

## 2020-01-24 ENCOUNTER — Other Ambulatory Visit: Payer: Self-pay

## 2020-01-24 ENCOUNTER — Encounter: Payer: Self-pay | Admitting: Registered Nurse

## 2020-01-24 VITALS — Ht 60.0 in | Wt 211.0 lb

## 2020-01-24 DIAGNOSIS — R059 Cough, unspecified: Secondary | ICD-10-CM

## 2020-01-24 DIAGNOSIS — J302 Other seasonal allergic rhinitis: Secondary | ICD-10-CM | POA: Insufficient documentation

## 2020-01-24 MED ORDER — BENZONATATE 100 MG PO CAPS: 100.0000 mg | ORAL_CAPSULE | Freq: Two times a day (BID) | ORAL | 0 refills | Status: DC | PRN

## 2020-01-24 MED ORDER — MONTELUKAST SODIUM 10 MG PO TABS: 10.0000 mg | ORAL_TABLET | Freq: Every day | ORAL | 3 refills | Status: AC

## 2020-01-24 NOTE — Patient Instructions (Signed)
° ° ° °  If you have lab work done today you will be contacted with your lab results within the next 2 weeks.  If you have not heard from us then please contact us. The fastest way to get your results is to register for My Chart. ° ° °IF you received an x-ray today, you will receive an invoice from [facility]. Please contact [facility] at [phone] with questions or concerns regarding your invoice.  ° °IF you received labwork today, you will receive an invoice from [facility]. Please contact [facility] at [phone] with questions or concerns regarding your invoice.  ° °Our billing staff will not be able to assist you with questions regarding bills from these companies. ° °You will be contacted with the lab results as soon as they are available. The fastest way to get your results is to activate your My Chart account. Instructions are located on the last page of this paperwork. If you have not heard from us regarding the results in 2 weeks, please contact this office. °  ° ° ° °

## 2020-01-24 NOTE — Progress Notes (Signed)
Telemedicine Encounter- SOAP NOTE Established Patient  This telephone encounter was conducted with the patient's (or proxy's) verbal consent via video telecommunications: yes  Patient was instructed to have this encounter in a suitably private space; and to only have persons present to whom they give permission to participate. In addition, patient identity was confirmed by use of name plus two identifiers (DOB and address).  I discussed the limitations, risks, security and privacy concerns of performing an evaluation and management service by telephone and the availability of in person appointments. I also discussed with the patient that there may be a patient responsible charge related to this service. The patient expressed understanding and agreed to proceed.  I spent a total of 16 minutes talking with the patient or their proxy.  Patient at home Provider in office  Chief Complaint  Patient presents with   Cough    sx since sunday. yesterday confirmed NEG   Headache    Subjective   [name] [name] is a 44 y.o. established patient. Telephone visit today for cough and headache.  HPI Coughing since Sunday. COVID test on Wednesday, resulted Thursday, negative. Daughter also has symptoms. No other sick contacts Cough is dry No shob Headache may be from cough No fever, chills, sweats, fatigue.  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   Gallstones 10/15/2013   Class 3 severe obesity with serious comorbidity and body mass index (BMI) of 40.0 to 44.9 in adult [facility]) 09/21/2012   Asthma 05/14/2011    Past Medical History:    Diagnosis Date   Allergy    Arthritis    Asthma    Back pain    Edema, lower extremity    Food allergy    Joint pain    Migraine without aura    migraine   Obesity    Plantar fasciitis    PONV (postoperative nausea and vomiting)    Ruptured disc, cervical    Shortness of breath    Sleep apnea 08-16-13    mild no cpap needed per sleep study results    Current Outpatient Medications  Medication Sig Dispense Refill   albuterol (PROVENTIL) (2.5 MG/3ML) 0.083% nebulizer solution Take 3 mLs (2.5 mg total) by nebulization every 6 (six) hours as needed for wheezing or shortness of breath. 150 mL 1   albuterol (VENTOLIN HFA) 108 (90 Base) MCG/ACT inhaler Inhale 2 puffs into the lungs every 6 (six) hours as needed for wheezing or shortness of breath. 18 g 0   Biotin 1000 MCG CHEW Chew by mouth.     buPROPion (WELLBUTRIN SR) 150 MG 12 hr tablet Take 1 tablet (150 mg total) by mouth daily with breakfast. 30 tablet 0   calcium citrate-vitamin D (CITRACAL+D) 315-200 MG-UNIT tablet Take by mouth.     clobetasol (TEMOVATE) 0.05 % external solution SMARTSIG:1 Sparingly Topical Twice Daily PRN     cyclobenzaprine (FLEXERIL) 10 MG tablet TAKE 1 TABLET (10 MG TOTAL) BY MOUTH TWO (2) TIMES A DAY AS NEEDED FOR MUSCLE SPASMS.  0   etonogestrel-ethinyl estradiol (NUVARING) 0.12-0.015 MG/24HR vaginal ring Place 1 each vaginally every 28 (twenty-eight) days. Insert vaginally and leave in place for  3 consecutive weeks, then remove for 1 week. 3 each 3   Fluocinonide 0.1 % CREA Apply topically daily as needed.     metFORMIN (GLUCOPHAGE) 500 MG tablet Take 1 tablet (500 mg total) by mouth daily. 30 tablet 0   Multiple Vitamin (MULTIVITAMIN) capsule Take by mouth.     benzonatate (TESSALON) 100 MG capsule Take 1 capsule (100 mg total) by mouth 2 (two) times daily as needed for cough. 20 capsule 0   montelukast (SINGULAIR) 10 MG tablet Take 1 tablet (10 mg total) by mouth at bedtime. 30  tablet 3   Current Facility-Administered Medications  Medication Dose Route Frequency Provider Last Rate Last Admin   albuterol (PROVENTIL) (2.5 MG/3ML) 0.083% nebulizer solution 2.5 mg  2.5 mg Nebulization Once Le, Thao P, DO        Allergies  Allergen Reactions   Other Other (See Comments)    Nut allergy rash, tongue swells   Oxycodone    Amoxicillin Rash   Codeine Nausea And Vomiting and Rash   Tramadol Nausea And Vomiting and Rash    Social History   Socioeconomic History   Marital status: Significant Other    Spouse name: Not on file   Number of children: 2   Years of education: Not on file   Highest education level: Not on file  Occupational History   Occupation: Immunologist  Tobacco Use   Smoking status: Never Smoker   Smokeless tobacco: Never Used  Scientific laboratory technician Use: Never used  Substance and Sexual Activity   Alcohol use: Yes    Comment: occasionally   Drug use: No   Sexual activity: Yes    Birth control/protection: Surgical  Other Topics Concern   Not on file  Social History Narrative   Not on file   Social Determinants of Health   Financial Resource Strain:    Difficulty of Paying Living Expenses: Not on file  Food Insecurity:    Worried About Charity fundraiser in the Last Year: Not on file   [employer] of Food in the Last Year: Not on file  Transportation Needs:    Lack of Transportation (Medical): Not on file   Lack of Transportation (Non-Medical): Not on file  Physical Activity:    Days of Exercise per Week: Not on file   Minutes of Exercise per Session: Not on file  Stress:    Feeling of Stress : Not on file  Social Connections:    Frequency of Communication with Friends and Family: Not on file   Frequency of Social Gatherings with Friends and Family: Not on file   Attends Religious Services: Not on file   Active Member of Clubs or Organizations: Not on file   Attends Theatre manager Meetings: Not on file   Marital Status: Not on file  Intimate Partner Violence:    Fear of Current or Ex-Partner: Not on file   Emotionally Abused: Not on file   Physically Abused: Not on file   Sexually Abused: Not on file    Review of Systems  Constitutional: Negative.   HENT: Negative.   Eyes: Negative.   Respiratory: Positive for cough. Negative for hemoptysis, sputum production, shortness of breath and wheezing.   Cardiovascular: Negative.   Gastrointestinal: Negative.   Genitourinary: Negative.   Musculoskeletal: Negative.   Skin: Negative.   Neurological: Positive for headaches. Negative for dizziness, tingling, tremors, sensory change, speech change, focal weakness, seizures, loss of  consciousness and weakness.  Endo/Heme/Allergies: Negative.   Psychiatric/Behavioral: Negative.     Objective   Vitals as reported by the patient: Today's Vitals   01/24/20 0817  Weight: 211 lb (95.7 kg)  Height: 5' (1.524 m)    Abir was seen today for cough and headache.  Diagnoses and all orders for this visit:  Cough -     benzonatate (TESSALON) 100 MG capsule; Take 1 capsule (100 mg total) by mouth 2 (two) times daily as needed for cough. -     montelukast (SINGULAIR) 10 MG tablet; Take 1 tablet (10 mg total) by mouth at bedtime.  Seasonal allergies -     benzonatate (TESSALON) 100 MG capsule; Take 1 capsule (100 mg total) by mouth 2 (two) times daily as needed for cough. -     montelukast (SINGULAIR) 10 MG tablet; Take 1 tablet (10 mg total) by mouth at bedtime.   PLAN  Feel this is likely viral vs seasonal allergies. Continue OTC allergy med  Start montelukast 10mg  PO qhs PRN  Tessalon for cough relief  Return if symptoms fail to improve  Patient encouraged to call clinic with any questions, comments, or concerns.   I discussed the assessment and treatment plan with the patient. The patient was provided an opportunity to ask questions and all  were answered. The patient agreed with the plan and demonstrated an understanding of the instructions.   The patient was advised to call back or seek an in-person evaluation if the symptoms worsen or if the condition fails to improve as anticipated.  I provided 16 minutes of face-to-face time during this encounter.  Maximiano Coss, NP  Primary Care at [facility]

## 2020-01-29 ENCOUNTER — Telehealth (INDEPENDENT_AMBULATORY_CARE_PROVIDER_SITE_OTHER): Payer: 59 | Admitting: Family Medicine

## 2020-01-29 DIAGNOSIS — F3289 Other specified depressive episodes: Secondary | ICD-10-CM

## 2020-01-29 DIAGNOSIS — Z6841 Body Mass Index (BMI) 40.0 and over, adult: Secondary | ICD-10-CM

## 2020-01-30 ENCOUNTER — Encounter (INDEPENDENT_AMBULATORY_CARE_PROVIDER_SITE_OTHER): Payer: Self-pay | Admitting: Family Medicine

## 2020-01-30 NOTE — Progress Notes (Signed)
TeleHealth Visit:  Due to the COVID-19 pandemic, this visit was completed with telemedicine (audio/video) technology to reduce patient and provider exposure as well as to preserve personal protective equipment.   [name] has verbally consented to this TeleHealth visit. The patient is located at home, the provider is located at the [facility] office. The participants in this visit include the listed provider and patient. The visit was conducted today via MyChart video.   Chief Complaint: OBESITY [name] [name] is here to discuss her progress with her obesity treatment plan along with follow-up of her obesity related diagnoses. [name] [name] is on the Category 1 Plan and states she is following her eating plan approximately 90% of the time. [name] states she is doing sit-ups and walking for 15-20 minutes 3 times per week.  Today's visit was #: 14 Starting weight: 232 lbs Starting date: 05/02/2019  Interim History: [name] [name] feels she has maintained her weight. She has been ill recently and was off the plan somewhat. She feels her hunger is satisfied on the Category 1. She notes no issues with the plan and she feels everything is going well.  Subjective:   1. Other depression with emotional eating [name] [name] notes cravings during times she would have her period, but cravings are well controlled over than that.  Assessment/Plan:   1. Other depression with emotional eating  [name] [name] will continue bupropion.    2. Class 3 severe obesity with serious comorbidity and body mass index (BMI) of 40.0 to 44.9 in adult, unspecified obesity type (HCC) [name] [name] is currently in the action stage of change. As such, her goal is to continue with weight loss efforts. She has agreed to the Category 1 Plan.   Exercise goals: As is.  Behavioral modification strategies: increasing lean protein intake and planning for success.  [name] [name] has agreed to follow-up with our clinic in 3 weeks.    Objective:   VITALS: Per patient if applicable, see vitals. GENERAL: Alert and in no acute distress. CARDIOPULMONARY: No increased WOB. Speaking in clear sentences.  PSYCH: Pleasant and cooperative. Speech normal rate and rhythm. Affect is appropriate. Insight and judgement are appropriate. Attention is focused, linear, and appropriate.  NEURO: Oriented as arrived to appointment on time with no prompting.   Lab Results  Component Value Date   CREATININE 0.76 12/17/2019   BUN 10 12/17/2019   NA 143 12/17/2019   K 4.8 12/17/2019   CL 107 (H) 12/17/2019   CO2 23 12/17/2019   Lab Results  Component Value Date   ALT 10 12/17/2019   AST 15 12/17/2019   ALKPHOS 38 (L) 12/17/2019   BILITOT 0.3 12/17/2019   Lab Results  Component Value Date   HGBA1C 4.5 (L) 12/17/2019   HGBA1C 4.8 05/02/2019   HGBA1C 4.7 04/11/2014   HGBA1C 5.1 05/02/2013   Lab Results  Component Value Date   INSULIN 12.8 12/17/2019   INSULIN 14.6 08/13/2019   INSULIN 19.9 05/02/2019   Lab Results  Component Value Date   TSH 1.930 05/02/2019   Lab Results  Component Value Date   CHOL 172 05/02/2019   HDL 61 05/02/2019   LDLCALC 92 05/02/2019   TRIG 109 05/02/2019   Lab Results  Component Value Date   WBC 6.0 05/02/2019   HGB 14.0 05/02/2019   HCT 42.1 05/02/2019   MCV 100 (H) 05/02/2019   PLT 262 05/02/2019   Lab Results  Component Value Date   IRON 101 05/02/2019   TIBC 333 05/02/2019  FERRITIN 71 05/02/2019    Attestation Statements:   Reviewed by clinician on day of visit: allergies, medications, problem list, medical history, surgical history, family history, social history, and previous encounter notes.   Wilhemena Durie, am acting as Location manager for [employer], FNP-C.  I have reviewed the above documentation for accuracy and completeness, and I agree with the above. - Georgianne Fick, FNP

## 2020-02-11 ENCOUNTER — Other Ambulatory Visit (INDEPENDENT_AMBULATORY_CARE_PROVIDER_SITE_OTHER): Payer: Self-pay | Admitting: Family Medicine

## 2020-02-11 ENCOUNTER — Encounter (INDEPENDENT_AMBULATORY_CARE_PROVIDER_SITE_OTHER): Payer: Self-pay

## 2020-02-11 DIAGNOSIS — F3289 Other specified depressive episodes: Secondary | ICD-10-CM

## 2020-02-11 NOTE — Telephone Encounter (Signed)
MyChart message sent to pt to find out if they have enough medication to get them through until next appt.   

## 2020-02-18 ENCOUNTER — Ambulatory Visit (INDEPENDENT_AMBULATORY_CARE_PROVIDER_SITE_OTHER): Payer: 59 | Admitting: Registered Nurse

## 2020-02-18 ENCOUNTER — Other Ambulatory Visit: Payer: Self-pay

## 2020-02-18 ENCOUNTER — Encounter: Payer: Self-pay | Admitting: Registered Nurse

## 2020-02-18 VITALS — BP 115/76 | HR 86 | Temp 98.0°F | Resp 18 | Ht 60.0 in | Wt 222.2 lb

## 2020-02-18 DIAGNOSIS — T753XXD Motion sickness, subsequent encounter: Secondary | ICD-10-CM | POA: Diagnosis not present

## 2020-02-18 NOTE — Patient Instructions (Signed)
° ° ° °  If you have lab work done today you will be contacted with your lab results within the next 2 weeks.  If you have not heard from us then please contact us. The fastest way to get your results is to register for My Chart. ° ° °IF you received an x-ray today, you will receive an invoice from [facility]. Please contact [facility] at [phone] with questions or concerns regarding your invoice.  ° °IF you received labwork today, you will receive an invoice from [facility]. Please contact [facility] at [phone] with questions or concerns regarding your invoice.  ° °Our billing staff will not be able to assist you with questions regarding bills from these companies. ° °You will be contacted with the lab results as soon as they are available. The fastest way to get your results is to activate your My Chart account. Instructions are located on the last page of this paperwork. If you have not heard from us regarding the results in 2 weeks, please contact this office. °  ° ° ° °

## 2020-02-19 ENCOUNTER — Encounter (INDEPENDENT_AMBULATORY_CARE_PROVIDER_SITE_OTHER): Payer: Self-pay | Admitting: Family Medicine

## 2020-02-19 ENCOUNTER — Ambulatory Visit (INDEPENDENT_AMBULATORY_CARE_PROVIDER_SITE_OTHER): Payer: 59 | Admitting: Family Medicine

## 2020-02-19 VITALS — BP 125/79 | HR 98 | Temp 98.8°F | Ht 60.0 in | Wt 215.0 lb

## 2020-02-19 DIAGNOSIS — E88819 Insulin resistance, unspecified: Secondary | ICD-10-CM

## 2020-02-19 DIAGNOSIS — E8881 Metabolic syndrome: Secondary | ICD-10-CM | POA: Diagnosis not present

## 2020-02-19 DIAGNOSIS — F3289 Other specified depressive episodes: Secondary | ICD-10-CM

## 2020-02-19 DIAGNOSIS — Z9189 Other specified personal risk factors, not elsewhere classified: Secondary | ICD-10-CM | POA: Diagnosis not present

## 2020-02-19 DIAGNOSIS — Z6841 Body Mass Index (BMI) 40.0 and over, adult: Secondary | ICD-10-CM

## 2020-02-19 MED ORDER — MECLIZINE HCL 12.5 MG PO TABS: 12.5000 mg | ORAL_TABLET | Freq: Three times a day (TID) | ORAL | 0 refills | Status: DC | PRN

## 2020-02-19 MED ORDER — OZEMPIC (0.25 OR 0.5 MG/DOSE) 2 MG/1.5ML ~~LOC~~ SOPN: 0.2500 mg | PEN_INJECTOR | SUBCUTANEOUS | 0 refills | Status: DC

## 2020-02-19 MED ORDER — BUPROPION HCL ER (SR) 150 MG PO TB12: 150.0000 mg | ORAL_TABLET | Freq: Every day | ORAL | 0 refills | Status: DC

## 2020-02-19 MED ORDER — SCOPOLAMINE 1 MG/3DAYS TD PT72: 1.0000 | MEDICATED_PATCH | TRANSDERMAL | 12 refills | Status: DC

## 2020-02-19 MED ORDER — ONDANSETRON HCL 4 MG PO TABS: 4.0000 mg | ORAL_TABLET | Freq: Three times a day (TID) | ORAL | 0 refills | Status: DC | PRN

## 2020-02-19 MED ORDER — METFORMIN HCL 500 MG PO TABS: 500.0000 mg | ORAL_TABLET | Freq: Every day | ORAL | 0 refills | Status: DC

## 2020-02-24 ENCOUNTER — Encounter (INDEPENDENT_AMBULATORY_CARE_PROVIDER_SITE_OTHER): Payer: Self-pay | Admitting: Family Medicine

## 2020-02-24 NOTE — Progress Notes (Signed)
Chief Complaint:   OBESITY [name] [name] is here to discuss her progress with her obesity treatment plan along with follow-up of her obesity related diagnoses. [name] [name] is on the Category 1 Plan and states she is following her eating plan approximately 70% of the time. [name] [name] states she is walking for 30 minutes 1 time per week.  Today's visit was #: 15 Starting weight: 232 lbs Starting date: 05/02/2019 Today's weight: 215 lbs Today's date: 02/19/2020 Total lbs lost to date: 17 Total lbs lost since last in-office visit: 0 (+4)  Interim History: [name] [name] feels her weight gain (up 4 lbs today) is partly due to increased dose of her inhaler with steroids. She feels bloated and her menses is coming. She notes increased cravings with inhaler and she has eaten more carbohydrates recently. She will be going on a cruise on 03/06/2020.  Subjective:   1. Insulin resistance [name] [name] is on metformin, and she notes some hunger.   Lab Results  Component Value Date   INSULIN 12.8 12/17/2019   INSULIN 14.6 08/13/2019   INSULIN 19.9 05/02/2019   Lab Results  Component Value Date   HGBA1C 4.5 (L) 12/17/2019   2. Other depression with emotional eating [name] [name]'s mood is stable on bupropion. She does not some cravings recently due to steroids.   3. At risk for side effect of medication [name] [name] is at risk for drug side effects due to starting Ozempic.  Assessment/Plan:   1. Insulin resistance . [name] [name] agreed to start Ozempic 0.25 mg SubQ weekly with no refills, and we will refill metformin for 1 month. [name] [name] agreed to follow-up with [place] as directed to closely monitor her progress.  - metFORMIN (GLUCOPHAGE) 500 MG tablet; Take 1 tablet (500 mg total) by mouth daily.  Dispense: 30 tablet; Refill: 0 - Semaglutide,0.25 or 0.5MG /DOS, (OZEMPIC, 0.25 OR 0.5 MG/DOSE,) 2 MG/1.5ML SOPN; Inject 0.25 mg into the skin once a week.  Dispense: 2 mL; Refill: 0  2. Other depression with  emotional eating . We will refill bupropion for 1 month. Orders and follow up as documented in patient record.   - buPROPion (WELLBUTRIN SR) 150 MG 12 hr tablet; Take 1 tablet (150 mg total) by mouth daily with breakfast.  Dispense: 30 tablet; Refill: 0  3. At risk for side effect of medication [name] [name] was given approximately 15 minutes of drug side effect counseling today. We discussed side effect possibility and risk versus benefits. [name] [name] agreed to the medication and will contact this office if these side effects are intolerable.  Repetitive spaced learning was employed today to elicit superior memory formation and behavioral change.  4. Class 3 severe obesity with serious comorbidity and body mass index (BMI) of 40.0 to 44.9 in adult, unspecified obesity type ([place]) [name] [name] is currently in the action stage of change. As such, her goal is to continue with weight loss efforts. She has agreed to the Category 1 Plan.   Exercise goals: Try to increase walking.  Behavioral modification strategies: decreasing simple carbohydrates and holiday eating strategies .  [name] [name] has agreed to follow-up with our clinic in 4 weeks. She was informed of the importance of frequent follow-up visits to maximize her success with intensive lifestyle modifications for her multiple health conditions.   Objective:   Blood pressure 125/79, pulse 98, temperature 98.8 F (37.1 C), height 5' (1.524 m), weight 215 lb (97.5 kg), SpO2 97 %. Body mass index is 41.99 kg/m.  General: Cooperative, alert, well developed, in no acute distress.  HEENT: Conjunctivae and lids unremarkable. Cardiovascular: Regular rhythm.  Lungs: Normal work of breathing. Neurologic: No focal deficits.   Lab Results  Component Value Date   CREATININE 0.76 12/17/2019   BUN 10 12/17/2019   NA 143 12/17/2019   K 4.8 12/17/2019   CL 107 (H) 12/17/2019   CO2 23 12/17/2019   Lab Results  Component Value Date   ALT 10 12/17/2019     AST 15 12/17/2019   ALKPHOS 38 (L) 12/17/2019   BILITOT 0.3 12/17/2019   Lab Results  Component Value Date   HGBA1C 4.5 (L) 12/17/2019   HGBA1C 4.8 05/02/2019   HGBA1C 4.7 04/11/2014   HGBA1C 5.1 05/02/2013   Lab Results  Component Value Date   INSULIN 12.8 12/17/2019   INSULIN 14.6 08/13/2019   INSULIN 19.9 05/02/2019   Lab Results  Component Value Date   TSH 1.930 05/02/2019   Lab Results  Component Value Date   CHOL 172 05/02/2019   HDL 61 05/02/2019   LDLCALC 92 05/02/2019   TRIG 109 05/02/2019   Lab Results  Component Value Date   WBC 6.0 05/02/2019   HGB 14.0 05/02/2019   HCT 42.1 05/02/2019   MCV 100 (H) 05/02/2019   PLT 262 05/02/2019   Lab Results  Component Value Date   IRON 101 05/02/2019   TIBC 333 05/02/2019   FERRITIN 71 05/02/2019   Attestation Statements:   Reviewed by clinician on day of visit: allergies, medications, problem list, medical history, surgical history, family history, social history, and previous encounter notes.   Wilhemena Durie, am acting as Location manager for [employer], FNP-C.  I have reviewed the above documentation for accuracy and completeness, and I agree with the above. -  Georgianne Fick, FNP

## 2020-03-15 ENCOUNTER — Other Ambulatory Visit (INDEPENDENT_AMBULATORY_CARE_PROVIDER_SITE_OTHER): Payer: Self-pay | Admitting: Family Medicine

## 2020-03-15 DIAGNOSIS — E8881 Metabolic syndrome: Secondary | ICD-10-CM

## 2020-03-16 NOTE — Telephone Encounter (Signed)
This patient was last seen by Lacie Draft and currently has an upcoming appt scheduled on 03/18/20 with her.

## 2020-03-18 ENCOUNTER — Other Ambulatory Visit: Payer: Self-pay

## 2020-03-18 ENCOUNTER — Ambulatory Visit (INDEPENDENT_AMBULATORY_CARE_PROVIDER_SITE_OTHER): Payer: 59 | Admitting: Family Medicine

## 2020-03-18 ENCOUNTER — Encounter (INDEPENDENT_AMBULATORY_CARE_PROVIDER_SITE_OTHER): Payer: Self-pay | Admitting: Family Medicine

## 2020-03-18 VITALS — BP 114/78 | HR 89 | Temp 97.9°F | Ht 60.0 in | Wt 212.0 lb

## 2020-03-18 DIAGNOSIS — M818 Other osteoporosis without current pathological fracture: Secondary | ICD-10-CM

## 2020-03-18 DIAGNOSIS — E8881 Metabolic syndrome: Secondary | ICD-10-CM

## 2020-03-18 DIAGNOSIS — Z6841 Body Mass Index (BMI) 40.0 and over, adult: Secondary | ICD-10-CM

## 2020-03-18 DIAGNOSIS — M810 Age-related osteoporosis without current pathological fracture: Secondary | ICD-10-CM | POA: Insufficient documentation

## 2020-03-18 NOTE — Progress Notes (Signed)
Chief Complaint:   OBESITY [name] [name] is here to discuss her progress with her obesity treatment plan along with follow-up of her obesity related diagnoses. [name] [name] is on the Category 1 Plan and states she is following her eating plan approximately 80% of the time. [name] [name] states she walked while on vacation.   Today's visit was #: 16 Starting weight: 232 lbs Starting date: 05/02/2019 Today's weight: 212 lbs Today's date: 03/18/2020 Total lbs lost to date: 20 Total lbs lost since last in-office visit: 3  Interim History: [name] [name] went on a cruise to [place], but she still lost 3 lbs. She reports doing a lot of walking on her cruise. She is now back on the plan.  Subjective:   1. Other osteoporosis [name] [name] was taking Tymlos subQ daily. She had an allergic reaction and Tymlos was discontinued. She was diagnosed this year. She is on calcium and vit D is 62.8. She is pre-menopausal.     2. Insulin resistance [name] [name] notes her hunger is managed well. She was started on Ozempic at her last office visit. She denies nausea or constipation.   Lab Results  Component Value Date   INSULIN 12.8 12/17/2019   INSULIN 14.6 08/13/2019   INSULIN 19.9 05/02/2019   Lab Results  Component Value Date   HGBA1C 4.5 (L) 12/17/2019   Assessment/Plan:   1. Other osteoporosis [name] [name] will continue Tymlos, and we will continue to monitor.   2. Insulin resistance [name] [name] will continue Ozempic, and will continue to work on weight loss, exercise, and decreasing simple carbohydrates to help decrease the risk of diabetes. [name] [name] agreed to follow-up with [place] as directed to closely monitor her progress.  3. Class 3 severe obesity with serious comorbidity and body mass index (BMI) of 40.0 to 44.9 in adult, unspecified obesity type ([place]) [name] [name] is currently in the action stage of change. As such, her goal is to continue with weight loss efforts. She has agreed to the Category 1 Plan.    Exercise goals: Consider starting exercise. She has a gym at work.  Behavioral modification strategies: planning for success.  [name] [name] has agreed to follow-up with our clinic in 3 weeks.   Objective:   Blood pressure 114/78, pulse 89, temperature 97.9 F (36.6 C), height 5' (1.524 m), weight 212 lb (96.2 kg), SpO2 96 %. Body mass index is 41.4 kg/m.  General: Cooperative, alert, well developed, in no acute distress. HEENT: Conjunctivae and lids unremarkable. Cardiovascular: Regular rhythm.  Lungs: Normal work of breathing. Neurologic: No focal deficits.   Lab Results  Component Value Date   CREATININE 0.76 12/17/2019   BUN 10 12/17/2019   NA 143 12/17/2019   K 4.8 12/17/2019   CL 107 (H) 12/17/2019   CO2 23 12/17/2019   Lab Results  Component Value Date   ALT 10 12/17/2019   AST 15 12/17/2019   ALKPHOS 38 (L) 12/17/2019   BILITOT 0.3 12/17/2019   Lab Results  Component Value Date   HGBA1C 4.5 (L) 12/17/2019   HGBA1C 4.8 05/02/2019   HGBA1C 4.7 04/11/2014   HGBA1C 5.1 05/02/2013   Lab Results  Component Value Date   INSULIN 12.8 12/17/2019   INSULIN 14.6 08/13/2019   INSULIN 19.9 05/02/2019   Lab Results  Component Value Date   TSH 1.930 05/02/2019   Lab Results  Component Value Date   CHOL 172 05/02/2019   HDL 61 05/02/2019   LDLCALC 92 05/02/2019   TRIG 109 05/02/2019   Lab Results  Component Value Date   WBC 6.0 05/02/2019   HGB 14.0 05/02/2019   HCT 42.1 05/02/2019   MCV 100 (H) 05/02/2019   PLT 262 05/02/2019   Lab Results  Component Value Date   IRON 101 05/02/2019   TIBC 333 05/02/2019   FERRITIN 71 05/02/2019   Attestation Statements:   Reviewed by clinician on day of visit: allergies, medications, problem list, medical history, surgical history, family history, social history, and previous encounter notes.   Wilhemena Durie, am acting as Location manager for [employer], FNP-C.  I have reviewed the above documentation  for accuracy and completeness, and I agree with the above. -  Georgianne Fick, FNP

## 2020-04-14 ENCOUNTER — Ambulatory Visit (INDEPENDENT_AMBULATORY_CARE_PROVIDER_SITE_OTHER): Payer: Self-pay | Admitting: Family Medicine

## 2020-04-15 ENCOUNTER — Encounter (INDEPENDENT_AMBULATORY_CARE_PROVIDER_SITE_OTHER): Payer: Self-pay | Admitting: Family Medicine

## 2020-04-15 ENCOUNTER — Other Ambulatory Visit: Payer: Self-pay

## 2020-04-15 ENCOUNTER — Ambulatory Visit (INDEPENDENT_AMBULATORY_CARE_PROVIDER_SITE_OTHER): Payer: Commercial Managed Care - PPO | Admitting: Family Medicine

## 2020-04-15 VITALS — BP 120/81 | HR 78 | Temp 97.9°F | Ht 60.0 in | Wt 215.0 lb

## 2020-04-15 DIAGNOSIS — E8881 Metabolic syndrome: Secondary | ICD-10-CM | POA: Diagnosis not present

## 2020-04-15 DIAGNOSIS — Z9189 Other specified personal risk factors, not elsewhere classified: Secondary | ICD-10-CM

## 2020-04-15 DIAGNOSIS — F3289 Other specified depressive episodes: Secondary | ICD-10-CM

## 2020-04-15 DIAGNOSIS — E88819 Insulin resistance, unspecified: Secondary | ICD-10-CM

## 2020-04-15 DIAGNOSIS — Z6841 Body Mass Index (BMI) 40.0 and over, adult: Secondary | ICD-10-CM

## 2020-04-15 MED ORDER — METFORMIN HCL 500 MG PO TABS: 500.0000 mg | ORAL_TABLET | Freq: Every day | ORAL | 0 refills | Status: DC

## 2020-04-15 MED ORDER — BUPROPION HCL ER (SR) 150 MG PO TB12: 150.0000 mg | ORAL_TABLET | Freq: Every day | ORAL | 0 refills | Status: DC

## 2020-04-15 MED ORDER — OZEMPIC (0.25 OR 0.5 MG/DOSE) 2 MG/1.5ML ~~LOC~~ SOPN: 0.5000 mg | PEN_INJECTOR | SUBCUTANEOUS | 0 refills | Status: DC

## 2020-04-18 ENCOUNTER — Encounter (INDEPENDENT_AMBULATORY_CARE_PROVIDER_SITE_OTHER): Payer: Self-pay | Admitting: Family Medicine

## 2020-04-18 DIAGNOSIS — E8881 Metabolic syndrome: Secondary | ICD-10-CM

## 2020-04-18 DIAGNOSIS — E88819 Insulin resistance, unspecified: Secondary | ICD-10-CM

## 2020-04-20 ENCOUNTER — Encounter (INDEPENDENT_AMBULATORY_CARE_PROVIDER_SITE_OTHER): Payer: Self-pay | Admitting: Family Medicine

## 2020-04-20 NOTE — Progress Notes (Addendum)
Chief Complaint:   OBESITY [name] [name] is here to discuss her progress with her obesity treatment plan along with follow-up of her obesity related diagnoses. [name] [name] is on the Category 1 Plan and states she is following her eating plan approximately 80% of the time. [name] [name] states she is not exercising.  Today's visit was #: 61 Starting weight: 232 lbs Starting date: 05/02/2019 Today's weight: 215 lbs Today's date: 04/15/2020 Total lbs lost to date: 17 lbs Total lbs lost since last in-office visit: 0  Interim History: [name] [name] was off plan over Christmas. She feels bloated and is having chocolate cravings (related to hormones-happens monthly). [name] [name] reports mostly being back on plan.  Subjective:   1. Insulin resistance  [name] [name] is on Ozempic 0.25 mg and metformin. She is having cravings.  Lab Results  Component Value Date   INSULIN 12.8 12/17/2019   INSULIN 14.6 08/13/2019   INSULIN 19.9 05/02/2019   Lab Results  Component Value Date   HGBA1C 4.5 (L) 12/17/2019    2. Other depression with emotional eating  She shows no sign of suicidal or homicidal ideations. [name] [name]'s mood is stable on bupropion. She is having cravings due to menses.  3. At risk for constipation [name] [name] is at increased risk for constipation due to an increase in Ozempic dose. Marland Kitchen [name] [name] denies hard, infrequent stools currently.    Assessment/Plan:   1. Insulin resistance  Will refill metformin 500 mg daily #30. No refill. Will increase Ozempic dose 0.5 mg weekly. No refill.  - metFORMIN (GLUCOPHAGE) 500 MG tablet; Take 1 tablet (500 mg total) by mouth daily.  Dispense: 30 tablet; Refill: 0  2. Other depression with emotional eating  Refill bupropion 150 mg daily #30. No refill.  - buPROPion (WELLBUTRIN SR) 150 MG 12 hr tablet; Take 1 tablet (150 mg total) by mouth daily with breakfast.  Dispense: 30 tablet; Refill: 0  3. At risk for constipation [name] [name] was given  approximately 15 minutes of counseling today regarding prevention of constipation. She was encouraged to increase water and fiber intake.   4. Class 3 severe obesity with serious comorbidity and body mass index (BMI) of 40.0 to 44.9 in adult, unspecified obesity type ([name] [name])  [name] [name] is currently in the action stage of change. As such, her goal is to continue with weight loss efforts. She has agreed to the Category 1 Plan.   - Semaglutide,0.25 or 0.5MG /DOS, (OZEMPIC, 0.25 OR 0.5 MG/DOSE,) 2 MG/1.5ML SOPN; Inject 0.5 mg into the skin once a week.  Dispense: 1 each; Refill: 0  Exercise goals: Will join [employer].  Behavioral modification strategies: decreasing simple carbohydrates.  [name] [name] has agreed to follow-up with our clinic in 3 weeks.   Objective:   Blood pressure 120/81, pulse 78, temperature 97.9 F (36.6 C), height 5' (1.524 m), weight 215 lb (97.5 kg), SpO2 98 %. Body mass index is 41.99 kg/m.  General: Cooperative, alert, well developed, in no acute distress. HEENT: Conjunctivae and lids unremarkable. Cardiovascular: Regular rhythm.  Lungs: Normal work of breathing. Neurologic: No focal deficits.   Lab Results  Component Value Date   CREATININE 0.76 12/17/2019   BUN 10 12/17/2019   NA 143 12/17/2019   K 4.8 12/17/2019   CL 107 (H) 12/17/2019   CO2 23 12/17/2019   Lab Results  Component Value Date   ALT 10 12/17/2019   AST 15 12/17/2019   ALKPHOS 38 (L) 12/17/2019   BILITOT 0.3 12/17/2019   Lab Results  Component Value Date  HGBA1C 4.5 (L) 12/17/2019   HGBA1C 4.8 05/02/2019   HGBA1C 4.7 04/11/2014   HGBA1C 5.1 05/02/2013   Lab Results  Component Value Date   INSULIN 12.8 12/17/2019   INSULIN 14.6 08/13/2019   INSULIN 19.9 05/02/2019   Lab Results  Component Value Date   TSH 1.930 05/02/2019   Lab Results  Component Value Date   CHOL 172 05/02/2019   HDL 61 05/02/2019   LDLCALC 92 05/02/2019   TRIG 109 05/02/2019   Lab Results   Component Value Date   WBC 6.0 05/02/2019   HGB 14.0 05/02/2019   HCT 42.1 05/02/2019   MCV 100 (H) 05/02/2019   PLT 262 05/02/2019   Lab Results  Component Value Date   IRON 101 05/02/2019   TIBC 333 05/02/2019   FERRITIN 71 05/02/2019      Attestation Statements:   Reviewed by clinician on day of visit: allergies, medications, problem list, medical history, surgical history, family history, social history, and previous encounter notes.    Tula Nakayama, am acting as Location manager for Georgianne Fick, FNP.  I have reviewed the above documentation for accuracy and completeness, and I agree with the above. -  Georgianne Fick, FNP

## 2020-04-20 NOTE — Telephone Encounter (Signed)
Last OV with Dawn 

## 2020-04-22 MED ORDER — TRULICITY 0.75 MG/0.5ML ~~LOC~~ SOAJ: 0.7500 mg | SUBCUTANEOUS | 0 refills | Status: DC

## 2020-04-27 ENCOUNTER — Encounter: Payer: Self-pay | Admitting: Registered Nurse

## 2020-04-27 NOTE — Progress Notes (Signed)
Established Patient Office Visit  Subjective:  Patient ID: [name] [name], female    DOB: [birth year]  Age: 45 y.o. MRN: [id number]  CC:  Chief Complaint  Patient presents with  . Medication Refill    Patient is here today to discuss medication for an cruise. Patient states she has been sick before and is trying to prevent it.    HPI Roxette Wulfekuhle presents for motion sickness  Has experienced this much in the past Is going on a cruise and is worried Has had a variety of treatments in the past Does not drink much alcohol Does not use sedatives  Past Medical History:  Diagnosis Date  . Allergy   . Arthritis   . Asthma   . Back pain   . Edema, lower extremity   . Food allergy   . Joint pain   . Migraine without aura    migraine  . Obesity   . Plantar fasciitis   . PONV (postoperative nausea and vomiting)   . Ruptured disc, cervical   . Shortness of breath   . Sleep apnea 08-16-13    mild no cpap needed per sleep study results    Past Surgical History:  Procedure Laterality Date  . FOOT NEUROMA SURGERY Right 2013  . KNEE ARTHROSCOPY Left 9-10 yrs ago  . 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 left foot surgery    . SHOULDER SURGERY    . TOE SURGERY  03/22/2018   cyst off of left toe bone removed  . TUBAL LIGATION  2004  . UPPER GI ENDOSCOPY  10/29/2013   Procedure: UPPER GI ENDOSCOPY;  Surgeon: [name], MD;  Location: [facility];  Service: General;;    Family History  Problem Relation Age of Onset  . Lung disease Father   . Pulmonary embolism Father   . Diabetes Father   . Depression Father   . Anxiety disorder Father   . Thyroid disease Mother   . COPD Other   . Diabetes Other     Social History   Socioeconomic History  . Marital status:  Significant Other    Spouse name: Not on file  . Number of children: 2  . Years of education: Not on file  . Highest education level: Not on file  Occupational History  . Occupation: Immunologist  Tobacco Use  . Smoking status: Never Smoker  . Smokeless tobacco: Never Used  Vaping Use  . Vaping Use: Never used  Substance and Sexual Activity  . Alcohol use: Yes    Comment: occasionally  . Drug use: No  . Sexual activity: Yes    Birth control/protection: Surgical  Other Topics Concern  . Not on file  Social History Narrative  . Not on file   Social Determinants of Health   Financial Resource Strain: Not on file  Food Insecurity: Not on file  Transportation Needs: Not on file  Physical Activity: Not on file  Stress: Not on file  Social Connections: Not on file  Intimate Partner Violence: Not on file    Outpatient Medications Prior to Visit  Medication Sig Dispense Refill  . albuterol (PROVENTIL) (2.5 MG/3ML) 0.083% nebulizer solution Take 3 mLs (2.5 mg total) by nebulization every 6 (six) hours as needed for wheezing or shortness of breath.  150 mL 1  . albuterol (VENTOLIN HFA) 108 (90 Base) MCG/ACT inhaler Inhale 2 puffs into the lungs every 6 (six) hours as needed for wheezing or shortness of breath. 18 g 0  . benzonatate (TESSALON) 100 MG capsule Take 1 capsule (100 mg total) by mouth 2 (two) times daily as needed for cough. 20 capsule 0  . Biotin 1000 MCG CHEW Chew by mouth.    . calcium citrate-vitamin D (CITRACAL+D) 315-200 MG-UNIT tablet Take by mouth.    . clobetasol (TEMOVATE) 0.05 % external solution SMARTSIG:1 Sparingly Topical Twice Daily PRN    . cyclobenzaprine (FLEXERIL) 10 MG tablet TAKE 1 TABLET (10 MG TOTAL) BY MOUTH TWO (2) TIMES A DAY AS NEEDED FOR MUSCLE SPASMS.  0  . etonogestrel-ethinyl estradiol (NUVARING) 0.12-0.015 MG/24HR vaginal ring Place 1 each vaginally every 28 (twenty-eight) days. Insert vaginally and leave in place for 3  consecutive weeks, then remove for 1 week. 3 each 3  . Fluocinonide 0.1 % CREA Apply topically daily as needed.    . montelukast (SINGULAIR) 10 MG tablet Take 1 tablet (10 mg total) by mouth at bedtime. 30 tablet 3  . Multiple Vitamin (MULTIVITAMIN) capsule Take by mouth.    Marland Kitchen buPROPion (WELLBUTRIN SR) 150 MG 12 hr tablet Take 1 tablet (150 mg total) by mouth daily with breakfast. 30 tablet 0  . metFORMIN (GLUCOPHAGE) 500 MG tablet Take 1 tablet (500 mg total) by mouth daily. 30 tablet 0   Facility-Administered Medications Prior to Visit  Medication Dose Route Frequency Provider Last Rate Last Admin  . albuterol (PROVENTIL) (2.5 MG/3ML) 0.083% nebulizer solution 2.5 mg  2.5 mg Nebulization Once Le, Thao P, DO        Allergies  Allergen Reactions  . Other Other (See Comments)    Nut allergy rash, tongue swells  . Oxycodone   . Amoxicillin Rash  . Codeine Nausea And Vomiting and Rash  . Tramadol Nausea And Vomiting and Rash    ROS Review of Systems  Constitutional: Negative.   HENT: Negative.   Eyes: Negative.   Respiratory: Negative.   Cardiovascular: Negative.   Gastrointestinal: Negative.   Genitourinary: Negative.   Musculoskeletal: Negative.   Skin: Negative.   Neurological: Negative.   Psychiatric/Behavioral: Negative.   All other systems reviewed and are negative.     Objective:    Physical Exam Vitals and nursing note reviewed.  Constitutional:      General: She is not in acute distress.    Appearance: Normal appearance. She is normal weight. She is not ill-appearing, toxic-appearing or diaphoretic.  Cardiovascular:     Rate and Rhythm: Normal rate and regular rhythm.     Heart sounds: Normal heart sounds. No murmur heard. No friction rub. No gallop.   Pulmonary:     Effort: Pulmonary effort is normal. No respiratory distress.     Breath sounds: Normal breath sounds. No stridor. No wheezing, rhonchi or rales.  Chest:     Chest wall: No tenderness.  Skin:     General: Skin is warm and dry.  Neurological:     General: No focal deficit present.     Mental Status: She is alert and oriented to person, place, and time. Mental status is at baseline.  Psychiatric:        Mood and Affect: Mood normal.        Behavior: Behavior normal.        Thought Content: Thought content normal.  Judgment: Judgment normal.     BP 115/76   Pulse 86   Temp 98 F (36.7 C) (Temporal)   Resp 18   Ht 5' (1.524 m)   Wt 222 lb 3.2 oz (100.8 kg)   SpO2 98%   BMI 43.40 kg/m  Wt Readings from Last 3 Encounters:  04/15/20 215 lb (97.5 kg)  03/18/20 212 lb (96.2 kg)  02/19/20 215 lb (97.5 kg)     Health Maintenance Due  Topic Date Due  . COLONOSCOPY (Pts 45-56yrs Insurance coverage will need to be confirmed)  Never done    There are no preventive care reminders to display for this patient.  Lab Results  Component Value Date   TSH 1.930 05/02/2019   Lab Results  Component Value Date   WBC 6.0 05/02/2019   HGB 14.0 05/02/2019   HCT 42.1 05/02/2019   MCV 100 (H) 05/02/2019   PLT 262 05/02/2019   Lab Results  Component Value Date   NA 143 12/17/2019   K 4.8 12/17/2019   CO2 23 12/17/2019   GLUCOSE 79 12/17/2019   BUN 10 12/17/2019   CREATININE 0.76 12/17/2019   BILITOT 0.3 12/17/2019   ALKPHOS 38 (L) 12/17/2019   AST 15 12/17/2019   ALT 10 12/17/2019   PROT 6.6 12/17/2019   ALBUMIN 4.3 12/17/2019   CALCIUM 8.7 12/17/2019   ANIONGAP 12 10/23/2013   Lab Results  Component Value Date   CHOL 172 05/02/2019   Lab Results  Component Value Date   HDL 61 05/02/2019   Lab Results  Component Value Date   LDLCALC 92 05/02/2019   Lab Results  Component Value Date   TRIG 109 05/02/2019   No results found for: CHOLHDL Lab Results  Component Value Date   HGBA1C 4.5 (L) 12/17/2019      Assessment & Plan:   Problem List Items Addressed This Visit   None   Visit Diagnoses    Sea sickness, subsequent encounter    -  Primary    Relevant Medications   meclizine (ANTIVERT) 12.5 MG tablet      Meds ordered this encounter  Medications  . DISCONTD: scopolamine (TRANSDERM-SCOP, 1.5 MG,) 1 MG/3DAYS    Sig: Place 1 patch (1.5 mg total) onto the skin every 3 (three) days.    Dispense:  10 patch    Refill:  12    Order Specific Question:   Supervising Provider    Answer:   Carlota Raspberry, JEFFREY R [[id number]]  . DISCONTD: ondansetron (ZOFRAN) 4 MG tablet    Sig: Take 1 tablet (4 mg total) by mouth every 8 (eight) hours as needed for nausea or vomiting.    Dispense:  20 tablet    Refill:  0    Order Specific Question:   Supervising Provider    Answer:   Carlota Raspberry, JEFFREY R [[id number]]  . meclizine (ANTIVERT) 12.5 MG tablet    Sig: Take 1 tablet (12.5 mg total) by mouth 3 (three) times daily as needed for dizziness.    Dispense:  30 tablet    Refill:  0    Order Specific Question:   Supervising Provider    Answer:   Carlota Raspberry, JEFFREY R [[id number]]    Follow-up: No follow-ups on file.   PLAN  Transderm  zofran  antivert  Discussed nonpharm  Return prn  Patient encouraged to call clinic with any questions, comments, or concerns.  Maximiano Coss, NP

## 2020-04-28 ENCOUNTER — Telehealth: Payer: Self-pay | Admitting: *Deleted

## 2020-04-28 NOTE — Telephone Encounter (Signed)
The generic nuvaring should have the same medication in it as the brand nuvaring. I would try it one more time, she might have just had a vaginal infection (particularly if she got better with the monistat).  If she continues to have issues, we could try OCP's. I found information stating that the gastric sleeve resection she had doesn't affect absorption like some other bariatric procedures.

## 2020-04-28 NOTE — Telephone Encounter (Signed)
Patient has been on generic nuvaring, has used brand nuvaring in past, but insurance no longer covers. Patient said 2 weeks ago she inserted a nuvaring and began to noticed burning and itching so she removed it, feels better used OTC Monistat. She wanted to know what other options does she have since the generic nuvaring caused discomfort? The nuvaring has regulated her cycles and helped with vasomotor symptoms. Please advise

## 2020-04-28 NOTE — Telephone Encounter (Signed)
Patient informed with below, reports she will try again and call back if problem recurs.

## 2020-05-06 ENCOUNTER — Ambulatory Visit (INDEPENDENT_AMBULATORY_CARE_PROVIDER_SITE_OTHER): Payer: Commercial Managed Care - PPO | Admitting: Family Medicine

## 2020-05-08 ENCOUNTER — Encounter (INDEPENDENT_AMBULATORY_CARE_PROVIDER_SITE_OTHER): Payer: Self-pay | Admitting: Family Medicine

## 2020-05-11 ENCOUNTER — Encounter: Payer: 59 | Admitting: Registered Nurse

## 2020-05-12 ENCOUNTER — Encounter (INDEPENDENT_AMBULATORY_CARE_PROVIDER_SITE_OTHER): Payer: Self-pay | Admitting: Family Medicine

## 2020-05-12 ENCOUNTER — Encounter (INDEPENDENT_AMBULATORY_CARE_PROVIDER_SITE_OTHER): Payer: Self-pay

## 2020-05-12 ENCOUNTER — Telehealth (INDEPENDENT_AMBULATORY_CARE_PROVIDER_SITE_OTHER): Payer: Commercial Managed Care - PPO | Admitting: Family Medicine

## 2020-05-12 DIAGNOSIS — F3289 Other specified depressive episodes: Secondary | ICD-10-CM

## 2020-05-12 DIAGNOSIS — E8881 Metabolic syndrome: Secondary | ICD-10-CM

## 2020-05-12 DIAGNOSIS — Z6841 Body Mass Index (BMI) 40.0 and over, adult: Secondary | ICD-10-CM

## 2020-05-12 MED ORDER — TRULICITY 0.75 MG/0.5ML ~~LOC~~ SOAJ: 0.7500 mg | SUBCUTANEOUS | 0 refills | Status: DC

## 2020-05-12 NOTE — Progress Notes (Signed)
TeleHealth Visit:  Due to the COVID-19 pandemic, this visit was completed with telemedicine (audio/video) technology to reduce patient and provider exposure as well as to preserve personal protective equipment.   [name] [name] has verbally consented to this TeleHealth visit. The patient is located at home, the provider is located at the [facility] office. The participants in this visit include the listed provider and patient.  Telephone call- 12 minutes   Chief Complaint: OBESITY [name] [name] is here to discuss her progress with her obesity treatment plan along with follow-up of her obesity related diagnoses. [name] [name] is on the Category 1 Plan and states she is following her eating plan approximately 90% of the time. [name] [name] states she is walking for 60 minutes 5 times per week.  Today's visit was #: 18 Starting weight: 232 lbs Starting date: 05/02/2019  Interim History: [name] [name] feels she has maintained her weight. Her home weight today is 213 lbs. Last office visit weight was 215 lbs. She has adhered to the plan well over the past few weeks. 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 She is able to stick on plan despite traveling. Her appetite is well controlled with Trulicity.  Subjective:   1. Insulin resistance [name] [name] was started on 0.30 mg Trulicity last office visit. Her insurance would not cover Ozempic. She feels Trulicity is working better than Ozempic.   Lab Results  Component Value Date   INSULIN 12.8 12/17/2019   INSULIN 14.6 08/13/2019   INSULIN 19.9 05/02/2019   Lab Results  Component Value Date   HGBA1C 4.5 (L) 12/17/2019   2. Other depression with emotional eating [name] [name]'s mood and cravings are well controlled with bupropion and Trulicity. She has even been able to resist chocolate since starting Trulicity.  Assessment/Plan:   1. Insulin resistance . We will refill Trulicity 0.92 mg weekly with no refills. [name] [name] agreed to follow-up with  [place] as directed to closely monitor her progress.  2. Other depression with emotional eating  [name] [name] will continue bupropion. Orders and follow up as documented in patient record.   3. Class 3 severe obesity with serious comorbidity and body mass index (BMI) of 40.0 to 44.9 in adult, unspecified obesity type ([name] [name]) [name] [name] is currently in the action stage of change. As such, her goal is to continue with weight loss efforts. She has agreed to the Category 1 Plan.   Exercise goals: As is.  Behavioral modification strategies: increasing lean protein intake.  [name] [name] has agreed to follow-up with our clinic in 3 weeks.  Objective:   VITALS: Per patient if applicable, see vitals. GENERAL: Alert and in no acute distress. CARDIOPULMONARY: No increased WOB. Speaking in clear sentences.  PSYCH: Pleasant and cooperative. Speech normal rate and rhythm. Affect is appropriate. Insight and judgement are appropriate. Attention is focused, linear, and appropriate.  NEURO: Oriented as arrived to appointment on time with no prompting.   Lab Results  Component Value Date   CREATININE 0.76 12/17/2019   BUN 10 12/17/2019   NA 143 12/17/2019   K 4.8 12/17/2019   CL 107 (H) 12/17/2019   CO2 23 12/17/2019   Lab Results  Component Value Date   ALT 10 12/17/2019   AST 15 12/17/2019   ALKPHOS 38 (L) 12/17/2019   BILITOT 0.3 12/17/2019   Lab Results  Component Value Date   HGBA1C 4.5 (L) 12/17/2019   HGBA1C 4.8 05/02/2019   HGBA1C 4.7 04/11/2014   HGBA1C 5.1 05/02/2013   Lab Results  Component Value  Date   INSULIN 12.8 12/17/2019   INSULIN 14.6 08/13/2019   INSULIN 19.9 05/02/2019   Lab Results  Component Value Date   TSH 1.930 05/02/2019   Lab Results  Component Value Date   CHOL 172 05/02/2019   HDL 61 05/02/2019   LDLCALC 92 05/02/2019   TRIG 109 05/02/2019   Lab Results  Component Value Date   WBC 6.0 05/02/2019   HGB 14.0 05/02/2019   HCT 42.1 05/02/2019   MCV 100 (H)  05/02/2019   PLT 262 05/02/2019   Lab Results  Component Value Date   IRON 101 05/02/2019   TIBC 333 05/02/2019   FERRITIN 71 05/02/2019    Attestation Statements:   Reviewed by clinician on day of visit: allergies, medications, problem list, medical history, surgical history, family history, social history, and previous encounter notes.   Wilhemena Durie, am acting as Location manager for [employer], FNP-C.  I have reviewed the above documentation for accuracy and completeness, and I agree with the above. - Georgianne Fick, FNP

## 2020-05-14 ENCOUNTER — Other Ambulatory Visit (INDEPENDENT_AMBULATORY_CARE_PROVIDER_SITE_OTHER): Payer: Self-pay | Admitting: Family Medicine

## 2020-05-14 DIAGNOSIS — E8881 Metabolic syndrome: Secondary | ICD-10-CM

## 2020-05-14 NOTE — Telephone Encounter (Signed)
Refill request

## 2020-06-02 ENCOUNTER — Other Ambulatory Visit: Payer: Self-pay

## 2020-06-02 ENCOUNTER — Ambulatory Visit (INDEPENDENT_AMBULATORY_CARE_PROVIDER_SITE_OTHER): Payer: Commercial Managed Care - PPO | Admitting: Family Medicine

## 2020-06-02 ENCOUNTER — Encounter (INDEPENDENT_AMBULATORY_CARE_PROVIDER_SITE_OTHER): Payer: Self-pay | Admitting: Family Medicine

## 2020-06-02 VITALS — BP 111/76 | HR 90 | Temp 97.5°F | Ht 60.0 in | Wt 214.0 lb

## 2020-06-02 DIAGNOSIS — E88819 Insulin resistance, unspecified: Secondary | ICD-10-CM

## 2020-06-02 DIAGNOSIS — Z6841 Body Mass Index (BMI) 40.0 and over, adult: Secondary | ICD-10-CM

## 2020-06-02 DIAGNOSIS — F3289 Other specified depressive episodes: Secondary | ICD-10-CM | POA: Diagnosis not present

## 2020-06-02 DIAGNOSIS — Z9189 Other specified personal risk factors, not elsewhere classified: Secondary | ICD-10-CM | POA: Diagnosis not present

## 2020-06-02 DIAGNOSIS — E8881 Metabolic syndrome: Secondary | ICD-10-CM

## 2020-06-02 MED ORDER — BUPROPION HCL ER (SR) 150 MG PO TB12: 150.0000 mg | ORAL_TABLET | Freq: Every day | ORAL | 0 refills | Status: DC

## 2020-06-02 MED ORDER — TRULICITY 1.5 MG/0.5ML ~~LOC~~ SOAJ: 1.5000 mg | SUBCUTANEOUS | 0 refills | Status: DC

## 2020-06-03 NOTE — Progress Notes (Signed)
Chief Complaint:   OBESITY [name] [name] is here to discuss her progress with her obesity treatment plan along with follow-up of her obesity related diagnoses. [name] [name] is on the Category 1 Plan and states she is following her eating plan approximately 90% of the time. [name] states she is doing cardio, walking, and strength for 60 minutes 2-7 times per week.  Today's visit was #: 40 Starting weight: 232 lbs Starting date: 05/02/2019 Today's weight: 214 lbs Today's date: 06/02/2020 Total lbs lost to date: 18 Total lbs lost since last in-office visit: 1  Interim History: [name] [name] is struggling on the weekends with snacking due to having extra time to think about food. She does much better during the week on the plan. She is getting in adequate protein.  Subjective:   1. Insulin resistance Dolly ntes increased hunger and cravings. She is on Trulicity 0.10 mg daily.   Lab Results  Component Value Date   INSULIN 12.8 12/17/2019   INSULIN 14.6 08/13/2019   INSULIN 19.9 05/02/2019   Lab Results  Component Value Date   HGBA1C 4.5 (L) 12/17/2019   2. Other depression with emotional eating [name] [name] notes cravings on weekends, and her mood is stable.  3. At risk for side effect of medication [name] [name] is at risk for drug side effects due to increased dose of Trulicity.  Assessment/Plan:   1. Insulin resistance  Hazely agreed to increase Trulicity to 1.5 mg weekly with no refills.  - Dulaglutide (TRULICITY) 1.5 XN/2.3FT SOPN; Inject 1.5 mg into the skin once a week.  Dispense: 2 mL; Refill: 0  2. Other depression with emotional eating Behavior modification techniques were discussed today to help [name] [name] deal with her emotional/non-hunger eating behaviors. We will refill bupropion for 1 month.  - buPROPion (WELLBUTRIN SR) 150 MG 12 hr tablet; Take 1 tablet (150 mg total) by mouth daily with breakfast.  Dispense: 30 tablet; Refill: 0  3. At risk for side effect of  medication [name] was given approximately 15 minutes of drug side effect counseling today.  We discussed side effect possibility and risk versus benefits. [name] agreed to the medication and will contact this office if these side effects are intolerable.  Repetitive spaced learning was employed today to elicit superior memory formation and behavioral change.  4. Class 3 severe obesity with serious comorbidity and body mass index (BMI) of 40.0 to 44.9 in adult, unspecified obesity type ([name]) [name] [name] is currently in the action stage of change. As such, her goal is to continue with weight loss efforts. She has agreed to the Category 1 Plan.   We will recheck fasting labs at her next office visit.  Exercise goals: As is.  Behavioral modification strategies: decreasing simple carbohydrates and better snacking choices.  [name] [name] has agreed to follow-up with our clinic in 3 weeks.   Objective:   Blood pressure 111/76, pulse 90, temperature (!) 97.5 F (36.4 C), temperature source Oral, height 5' (1.524 m), weight 214 lb (97.1 kg), SpO2 97 %. Body mass index is 41.79 kg/m.  General: Cooperative, alert, well developed, in no acute distress. HEENT: Conjunctivae and lids unremarkable. Cardiovascular: Regular rhythm.  Lungs: Normal work of breathing. Neurologic: No focal deficits.   Lab Results  Component Value Date   CREATININE 0.76 12/17/2019   BUN 10 12/17/2019   NA 143 12/17/2019   K 4.8 12/17/2019   CL 107 (H) 12/17/2019   CO2 23 12/17/2019   Lab Results  Component Value Date   ALT  10 12/17/2019   AST 15 12/17/2019   ALKPHOS 38 (L) 12/17/2019   BILITOT 0.3 12/17/2019   Lab Results  Component Value Date   HGBA1C 4.5 (L) 12/17/2019   HGBA1C 4.8 05/02/2019   HGBA1C 4.7 04/11/2014   HGBA1C 5.1 05/02/2013   Lab Results  Component Value Date   INSULIN 12.8 12/17/2019   INSULIN 14.6 08/13/2019   INSULIN 19.9 05/02/2019   Lab Results  Component Value Date   TSH  1.930 05/02/2019   Lab Results  Component Value Date   CHOL 172 05/02/2019   HDL 61 05/02/2019   LDLCALC 92 05/02/2019   TRIG 109 05/02/2019   Lab Results  Component Value Date   WBC 6.0 05/02/2019   HGB 14.0 05/02/2019   HCT 42.1 05/02/2019   MCV 100 (H) 05/02/2019   PLT 262 05/02/2019   Lab Results  Component Value Date   IRON 101 05/02/2019   TIBC 333 05/02/2019   FERRITIN 71 05/02/2019   Attestation Statements:   Reviewed by clinician on day of visit: allergies, medications, problem list, medical history, surgical history, family history, social history, and previous encounter notes.   Wilhemena Durie, am acting as Location manager for [employer], FNP-C.  I have reviewed the above documentation for accuracy and completeness, and I agree with the above. -  Georgianne Fick, FNP

## 2020-06-04 ENCOUNTER — Encounter (INDEPENDENT_AMBULATORY_CARE_PROVIDER_SITE_OTHER): Payer: Self-pay | Admitting: Family Medicine

## 2020-06-16 ENCOUNTER — Encounter: Payer: Self-pay | Admitting: Obstetrics and Gynecology

## 2020-06-17 MED ORDER — ETONOGESTREL-ETHINYL ESTRADIOL 0.12-0.015 MG/24HR VA RING: 1.0000 | VAGINAL_RING | VAGINAL | 1 refills | Status: DC

## 2020-06-19 ENCOUNTER — Encounter: Payer: Commercial Managed Care - PPO | Admitting: Registered Nurse

## 2020-06-25 ENCOUNTER — Other Ambulatory Visit: Payer: Self-pay

## 2020-06-25 ENCOUNTER — Encounter (INDEPENDENT_AMBULATORY_CARE_PROVIDER_SITE_OTHER): Payer: Self-pay | Admitting: Family Medicine

## 2020-06-25 ENCOUNTER — Ambulatory Visit (INDEPENDENT_AMBULATORY_CARE_PROVIDER_SITE_OTHER): Payer: Commercial Managed Care - PPO | Admitting: Family Medicine

## 2020-06-25 VITALS — BP 138/87 | HR 99 | Temp 97.9°F | Ht 60.0 in | Wt 213.0 lb

## 2020-06-25 DIAGNOSIS — E559 Vitamin D deficiency, unspecified: Secondary | ICD-10-CM

## 2020-06-25 DIAGNOSIS — E8881 Metabolic syndrome: Secondary | ICD-10-CM | POA: Diagnosis not present

## 2020-06-25 DIAGNOSIS — Z9189 Other specified personal risk factors, not elsewhere classified: Secondary | ICD-10-CM

## 2020-06-25 DIAGNOSIS — Z6841 Body Mass Index (BMI) 40.0 and over, adult: Secondary | ICD-10-CM

## 2020-06-25 DIAGNOSIS — Z9884 Bariatric surgery status: Secondary | ICD-10-CM | POA: Diagnosis not present

## 2020-06-25 DIAGNOSIS — E7849 Other hyperlipidemia: Secondary | ICD-10-CM | POA: Diagnosis not present

## 2020-06-25 DIAGNOSIS — E88819 Insulin resistance, unspecified: Secondary | ICD-10-CM

## 2020-06-25 DIAGNOSIS — F3289 Other specified depressive episodes: Secondary | ICD-10-CM

## 2020-06-25 MED ORDER — BUPROPION HCL ER (SR) 150 MG PO TB12: 150.0000 mg | ORAL_TABLET | Freq: Every day | ORAL | 0 refills | Status: DC

## 2020-06-25 MED ORDER — VITAMIN D 125 MCG (5000 UT) PO CAPS: 1.0000 | ORAL_CAPSULE | Freq: Every day | ORAL | 0 refills | Status: AC

## 2020-06-25 NOTE — Progress Notes (Signed)
The 10-year ASCVD risk score Mikey Bussing DC Brooke Bonito., et al., 2013) is: 0.7%   Values used to calculate the score:     Age: 45 years     Sex: Female     Is Non-Hispanic African American: No     Diabetic: No     Tobacco smoker: No     Systolic Blood Pressure: 681 mmHg     Is BP treated: No     HDL Cholesterol: 61 mg/dL     Total Cholesterol: 172 mg/dL

## 2020-06-26 LAB — COMPREHENSIVE METABOLIC PANEL
ALT: 13 IU/L (ref 0–32)
AST: 19 IU/L (ref 0–40)
Albumin/Globulin Ratio: 1.5 (ref 1.2–2.2)
Albumin: 4 g/dL (ref 3.8–4.8)
Alkaline Phosphatase: 47 IU/L (ref 44–121)
BUN/Creatinine Ratio: 11 (ref 9–23)
BUN: 10 mg/dL (ref 6–24)
Bilirubin Total: 0.5 mg/dL (ref 0.0–1.2)
CO2: 21 mmol/L (ref 20–29)
Calcium: 9.3 mg/dL (ref 8.7–10.2)
Chloride: 103 mmol/L (ref 96–106)
Creatinine, Ser: 0.93 mg/dL (ref 0.57–1.00)
Globulin, Total: 2.7 g/dL (ref 1.5–4.5)
Glucose: 72 mg/dL (ref 65–99)
Potassium: 4.4 mmol/L (ref 3.5–5.2)
Sodium: 139 mmol/L (ref 134–144)
Total Protein: 6.7 g/dL (ref 6.0–8.5)
eGFR: 77 mL/min/{1.73_m2} (ref >59)

## 2020-06-26 LAB — HEMOGLOBIN A1C
Est. average glucose Bld gHb Est-mCnc: 82 mg/dL
Hgb A1c MFr Bld: 4.5 % — ABNORMAL LOW (ref 4.8–5.6)

## 2020-06-26 LAB — VITAMIN D 25 HYDROXY (VIT D DEFICIENCY, FRACTURES): Vit D, 25-Hydroxy: 45.1 ng/mL (ref 30.0–100.0)

## 2020-06-26 LAB — LIPID PANEL WITH LDL/HDL RATIO
Cholesterol, Total: 162 mg/dL (ref 100–199)
HDL: 58 mg/dL (ref >39)
LDL Chol Calc (NIH): 88 mg/dL (ref 0–99)
LDL/HDL Ratio: 1.5 ratio (ref 0.0–3.2)
Triglycerides: 86 mg/dL (ref 0–149)
VLDL Cholesterol Cal: 16 mg/dL (ref 5–40)

## 2020-06-26 LAB — INSULIN, RANDOM: INSULIN: 5.7 u[IU]/mL (ref 2.6–24.9)

## 2020-06-29 ENCOUNTER — Encounter (INDEPENDENT_AMBULATORY_CARE_PROVIDER_SITE_OTHER): Payer: Self-pay | Admitting: Family Medicine

## 2020-06-29 NOTE — Progress Notes (Signed)
Chief Complaint:   OBESITY [name] [name] is here to discuss her progress with her obesity treatment plan along with follow-up of her obesity related diagnoses. [name] [name] is on the Category 1 Plan and states she is following her eating plan approximately 80-90% of the time. [name] [name] states she is walking for 30-60 minutes 7 times per week.  Today's visit was #: 20 Starting weight: 232 lbs Starting date: 05/02/2019 Today's weight: 213 lbs Today's date: 06/25/2020 Total lbs lost to date: 19 lbs Total lbs lost since last in-office visit: 1 lb  Interim History: [name] [name] feels that everything is going well with her meal plan.  No questions or issues.  She has lost 19 pounds since 05/02/2019.  She is status post sleeve gastrectomy in 2015.  She gets in her walking while at work.  She says she has applied for a job in [place], [place].  Subjective:   1. Insulin resistance On Trulicity.  Dose increased to 1.5 mg at last office visit.  Notes good appetite suppression.  Lab Results  Component Value Date   INSULIN 5.7 06/25/2020   INSULIN 12.8 12/17/2019   INSULIN 14.6 08/13/2019   INSULIN 19.9 05/02/2019   Lab Results  Component Value Date   HGBA1C 4.5 (L) 06/25/2020   2. Other hyperlipidemia LDL elevated at 142.  HDL low at 38.  TG high at 192.  Not on a statin.  ASCVD risk at 0.7%.  Lab Results  Component Value Date   ALT 13 06/25/2020   AST 19 06/25/2020   ALKPHOS 47 06/25/2020   BILITOT 0.5 06/25/2020   Lab Results  Component Value Date   CHOL 162 06/25/2020   HDL 58 06/25/2020   LDLCALC 88 06/25/2020   TRIG 86 06/25/2020   3. Vitamin D deficiency On 5,000 IU OTC vitamin D daily.  Last vitamin D level at goal.  4. 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  Takes a multivitamin with iron daily.  5. At risk for malnutrition [name] [name] is at increased risk for malnutrition due to history of sleeve gastrectomy.  6. Other depression with  emotional eating Mood stable.  Only has cravings during her menses.  Assessment/Plan:   1. Insulin resistance Continue Trulicity at 1.5 mg weekly.  Check A1c, insulin, and fasting glucose today.  - Comprehensive metabolic panel - Hemoglobin A1c - Insulin, random - Lipid Panel With LDL/HDL Ratio  2. Other hyperlipidemia Check FLP today.  - Lipid Panel With LDL/HDL Ratio  3. Vitamin D deficiency Check vitamin D level today.  Continue OTC vitamin D 5,000 OTC daily.  - VITAMIN D 25 Hydroxy (Vit-D Deficiency, Fractures) - Refill Cholecalciferol (VITAMIN D) 125 MCG (5000 UT) CAPS; Take 1 capsule by mouth daily.  Dispense: 30 capsule; Refill: 0  4. 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  Continue MVI with iron.  - CBC with Differential/Platelet  5. At risk for malnutrition [name] [name] was given approximately 15 minutes of counseling today regarding prevention of malnutrition and ways to meet macronutrient goals.  6. Other depression with emotional eating Refill Wellbutrin 150 mg daily, as per below.  - Refill buPROPion (WELLBUTRIN SR) 150 MG 12 hr tablet; Take 1 tablet (150 mg total) by mouth daily with breakfast.  Dispense: 30 tablet; Refill: 0  7. Class 3 severe obesity: BMI 70  [name] [name] is currently in the action stage of change. As such, her goal is to continue with weight loss efforts. She has agreed to  the Category 1 Plan.   Exercise goals: As is.  Behavioral modification strategies: planning for success.  [name] [name] has agreed to follow-up with our clinic in 3 weeks. [name] [name] was informed we would discuss her lab results at her next visit unless there is a critical issue that needs to be addressed sooner. [name] [name] agreed to keep her next visit at the agreed upon time to discuss these results.  Objective:   Blood pressure 138/87, pulse 99, temperature 97.9 F (36.6 C), height 5' (1.524 m), weight 213 lb (96.6 kg), SpO2 96 %. Body mass index  is 41.6 kg/m.  General: Cooperative, alert, well developed, in no acute distress. HEENT: Conjunctivae and lids unremarkable. Cardiovascular: Regular rhythm.  Lungs: Normal work of breathing. Neurologic: No focal deficits.   Lab Results  Component Value Date   CREATININE 0.93 06/25/2020   BUN 10 06/25/2020   NA 139 06/25/2020   K 4.4 06/25/2020   CL 103 06/25/2020   CO2 21 06/25/2020   Lab Results  Component Value Date   ALT 13 06/25/2020   AST 19 06/25/2020   ALKPHOS 47 06/25/2020   BILITOT 0.5 06/25/2020   Lab Results  Component Value Date   HGBA1C 4.5 (L) 06/25/2020   HGBA1C 4.5 (L) 12/17/2019   HGBA1C 4.8 05/02/2019   HGBA1C 4.7 04/11/2014   HGBA1C 5.1 05/02/2013   Lab Results  Component Value Date   INSULIN 5.7 06/25/2020   INSULIN 12.8 12/17/2019   INSULIN 14.6 08/13/2019   INSULIN 19.9 05/02/2019   Lab Results  Component Value Date   TSH 1.930 05/02/2019   Lab Results  Component Value Date   CHOL 162 06/25/2020   HDL 58 06/25/2020   LDLCALC 88 06/25/2020   TRIG 86 06/25/2020   Lab Results  Component Value Date   WBC 6.0 05/02/2019   HGB 14.0 05/02/2019   HCT 42.1 05/02/2019   MCV 100 (H) 05/02/2019   PLT 262 05/02/2019   Lab Results  Component Value Date   IRON 101 05/02/2019   TIBC 333 05/02/2019   FERRITIN 71 05/02/2019   Attestation Statements:   Reviewed by clinician on day of visit: allergies, medications, problem list, medical history, surgical history, family history, social history, and previous encounter notes.  I, Water quality scientist, CMA, am acting as Location manager for [employer], [place].  I have reviewed the above documentation for accuracy and completeness, and I agree with the above. -  Georgianne Fick, FNP

## 2020-07-09 ENCOUNTER — Encounter: Payer: Self-pay | Admitting: Internal Medicine

## 2020-07-09 ENCOUNTER — Telehealth (INDEPENDENT_AMBULATORY_CARE_PROVIDER_SITE_OTHER): Payer: Self-pay | Admitting: Internal Medicine

## 2020-07-09 DIAGNOSIS — Z7689 Persons encountering health services in other specified circumstances: Secondary | ICD-10-CM

## 2020-07-09 NOTE — Progress Notes (Signed)
Virtual Visit via Telephone Note  I connected with [name] [name], on 07/09/2020 at 10:23 AM by telephone due to the COVID-19 pandemic and verified that I am speaking with the correct person using two identifiers.   Consent: I discussed the limitations, risks, security and privacy concerns of performing an evaluation and management service by telephone and the availability of in person appointments. I also discussed with the patient that there may be a patient responsible charge related to this service. The patient expressed understanding and agreed to proceed.   Location of Patient: Home   Location of Provider: Clinic    Persons participating in Telemedicine visit: Swayzie Choate Dr. Juleen China    History of Present Illness: Patient has a visit to establish care. Patient reports that she is moving to [place] at the end of May for a promotion with work. She was previously established with [facility] but they have since closed. Has been a little bit since she was seen there. Has PMH of asthma. Had bariatric sleeve six years ago and goes to [facility].    Past Medical History:  Diagnosis Date  . Allergy   . Arthritis   . Asthma   . Back pain   . Edema, lower extremity   . Food allergy   . Joint pain   . Migraine without aura    migraine  . Obesity   . Plantar fasciitis   . PONV (postoperative nausea and vomiting)   . Ruptured disc, cervical   . Shortness of breath   . Sleep apnea 08-16-13    mild no cpap needed per sleep study results   Allergies  Allergen Reactions  . Other Other (See Comments)    Nut allergy rash, tongue swells  . Oxycodone   . Amoxicillin Rash  . Codeine Nausea And Vomiting and Rash  . Tramadol Nausea And Vomiting and Rash    Current Outpatient Medications on File Prior to Visit  Medication Sig Dispense Refill  . albuterol (PROVENTIL) (2.5 MG/3ML) 0.083% nebulizer solution Take 3 mLs (2.5 mg  total) by nebulization every 6 (six) hours as needed for wheezing or shortness of breath. 150 mL 1  . albuterol (VENTOLIN HFA) 108 (90 Base) MCG/ACT inhaler Inhale 2 puffs into the lungs every 6 (six) hours as needed for wheezing or shortness of breath. 18 g 0  . Biotin 1000 MCG CHEW Chew by mouth.    Marland Kitchen buPROPion (WELLBUTRIN SR) 150 MG 12 hr tablet Take 1 tablet (150 mg total) by mouth daily with breakfast. 30 tablet 0  . calcium citrate-vitamin D (CITRACAL+D) 315-200 MG-UNIT tablet Take by mouth.    . Cholecalciferol (VITAMIN D) 125 MCG (5000 UT) CAPS Take 1 capsule by mouth daily. 30 capsule 0  . clobetasol (TEMOVATE) 0.05 % external solution SMARTSIG:1 Sparingly Topical Twice Daily PRN    . cyclobenzaprine (FLEXERIL) 10 MG tablet TAKE 1 TABLET (10 MG TOTAL) BY MOUTH TWO (2) TIMES A DAY AS NEEDED FOR MUSCLE SPASMS.  0  . Dulaglutide (TRULICITY) 1.5 XH/3.7JI SOPN Inject 1.5 mg into the skin once a week. 2 mL 0  . etonogestrel-ethinyl estradiol (NUVARING) 0.12-0.015 MG/24HR vaginal ring Place 1 each vaginally every 28 (twenty-eight) days. Insert vaginally and leave in place for 3 consecutive weeks, then remove for 1 week. 3 each 1  . Fluocinonide 0.1 % CREA Apply topically daily as needed.    . metFORMIN (GLUCOPHAGE) 500 MG tablet TAKE 1 TABLET (500 MG TOTAL) BY  MOUTH DAILY. 30 tablet 0  . montelukast (SINGULAIR) 10 MG tablet Take 1 tablet (10 mg total) by mouth at bedtime. 30 tablet 3  . Multiple Vitamin (MULTIVITAMIN) capsule Take by mouth.     Current Facility-Administered Medications on File Prior to Visit  Medication Dose Route Frequency Provider Last Rate Last Admin  . albuterol (PROVENTIL) (2.5 MG/3ML) 0.083% nebulizer solution 2.5 mg  2.5 mg Nebulization Once Le, Thao P, DO        Observations/Objective: NAD. Speaking clearly.  Work of breathing normal.  Alert and oriented. Mood appropriate.   Assessment and Plan: 1. Encounter to establish care Reviewed patient's PMH, social  history, surgical history, and medications.  Given that she is moving, will defer annual exam for now. Will provide refills if needed prior to move and assist with any acute concerns.    Follow Up Instructions: PRN    I discussed the assessment and treatment plan with the patient. The patient was provided an opportunity to ask questions and all were answered. The patient agreed with the plan and demonstrated an understanding of the instructions.   The patient was advised to call back or seek an in-person evaluation if the symptoms worsen or if the condition fails to improve as anticipated.     I provided 8 minutes total of non-face-to-face time during this encounter including median intraservice time, reviewing previous notes, investigations, ordering medications, medical decision making, coordinating care and patient verbalized understanding at the end of the visit.    Phill Myron, D.O. Primary [facility] at [facility]  07/09/2020, 10:23 AM

## 2020-07-16 ENCOUNTER — Encounter (INDEPENDENT_AMBULATORY_CARE_PROVIDER_SITE_OTHER): Payer: Self-pay | Admitting: Family Medicine

## 2020-07-16 ENCOUNTER — Other Ambulatory Visit: Payer: Self-pay

## 2020-07-16 ENCOUNTER — Ambulatory Visit (INDEPENDENT_AMBULATORY_CARE_PROVIDER_SITE_OTHER): Payer: Commercial Managed Care - PPO | Admitting: Family Medicine

## 2020-07-16 VITALS — BP 115/82 | HR 104 | Temp 98.2°F | Ht 60.0 in | Wt 209.0 lb

## 2020-07-16 DIAGNOSIS — Z9189 Other specified personal risk factors, not elsewhere classified: Secondary | ICD-10-CM | POA: Diagnosis not present

## 2020-07-16 DIAGNOSIS — Z6841 Body Mass Index (BMI) 40.0 and over, adult: Secondary | ICD-10-CM

## 2020-07-16 DIAGNOSIS — E8881 Metabolic syndrome: Secondary | ICD-10-CM

## 2020-07-16 DIAGNOSIS — E559 Vitamin D deficiency, unspecified: Secondary | ICD-10-CM

## 2020-07-16 MED ORDER — TRULICITY 1.5 MG/0.5ML ~~LOC~~ SOAJ: 1.5000 mg | SUBCUTANEOUS | 0 refills | Status: DC

## 2020-07-20 ENCOUNTER — Other Ambulatory Visit: Payer: Self-pay | Admitting: Obstetrics and Gynecology

## 2020-07-20 DIAGNOSIS — Z1231 Encounter for screening mammogram for malignant neoplasm of breast: Secondary | ICD-10-CM

## 2020-07-21 ENCOUNTER — Encounter (INDEPENDENT_AMBULATORY_CARE_PROVIDER_SITE_OTHER): Payer: Self-pay | Admitting: Family Medicine

## 2020-07-21 NOTE — Progress Notes (Signed)
Chief Complaint:   OBESITY [name] [name] is here to discuss her progress with her obesity treatment plan along with follow-up of her obesity related diagnoses. [name] [name] is on the Category 1 Plan and states she is following her eating plan approximately 90% of the time. [name] [name] states she is walking for 60 minutes 5-6 times per week.  Today's visit was #: 21 Starting weight: 232 lbs Starting date: 05/02/2019 Today's weight: 209 lbs Today's date: 07/16/2020 Total lbs lost to date: 23 lbs Total lbs lost since last in-office visit: 4 lbs  Interim History: [name] [name] is doing very well on plan and is down 4 pounds today. She notes she has worked really hard on sticking to plan the last few weeks. She is moving to [facility] next month for her job but says her health insurance will stay the same.  Subjective:   1. Insulin resistance [name]'s dose of Trulicity increased last office visit. She notes some nausea 1st day of injection. She says it is working well for her cravings and hunger. She is also on Metformin 500 mg daily. She denies hypoglycemia.  Lab Results  Component Value Date   INSULIN 5.7 06/25/2020   INSULIN 12.8 12/17/2019   INSULIN 14.6 08/13/2019   INSULIN 19.9 05/02/2019   Lab Results  Component Value Date   HGBA1C 4.5 (L) 06/25/2020   2. Vitamin D deficiency Ginger's Vitamin D lever decreased from 62 to 45. Current in OTC Vitamin 5,000 IU daily.  3. At risk for osteoporosis [name] [name] is at higher risk of osteopenia and osteoporosis due to Vitamin D deficiency.   Assessment/Plan:   1. Insulin resistance We will refill Trulicity 1.5 mg weekly for 1 month, 0 refills.  - Dulaglutide (TRULICITY) 1.5 BM/8.4XL SOPN; Inject 1.5 mg into the skin once a week.  Dispense: 2 mL; Refill: 0  2. Vitamin D deficiency Increase dose to 5,000 IU Monday-Friday and 10,000 IU on Saturday and Sunday.  3. At risk for osteoporosis [name] [name] was given approximately 15 minutes of  osteoporosis prevention counseling today. [name] [name] is at risk for osteopenia and osteoporosis due to her Vitamin D deficiency. She was encouraged to take her Vitamin D and follow her higher calcium diet and increase strengthening exercise to help strengthen her bones and decrease her risk of osteopenia and osteoporosis.  Repetitive spaced learning was employed today to elicit superior memory formation and behavioral change.  4. Obesity, current BMI 40.9 [name] [name] is currently in the action stage of change. As such, her goal is to continue with weight loss efforts. She has agreed to the Category 1 Plan.   Exercise goals: As is.  Behavioral modification strategies: planning for success.  [name] [name] has agreed to follow-up with our clinic in 4 weeks.   Objective:   Blood pressure 115/82, pulse (!) 104, temperature 98.2 F (36.8 C), height 5' (1.524 m), weight 209 lb (94.8 kg), SpO2 98 %. Body mass index is 40.82 kg/m.  General: Cooperative, alert, well developed, in no acute distress. HEENT: Conjunctivae and lids unremarkable. Cardiovascular: Regular rhythm.  Lungs: Normal work of breathing. Neurologic: No focal deficits.   Lab Results  Component Value Date   CREATININE 0.93 06/25/2020   BUN 10 06/25/2020   NA 139 06/25/2020   K 4.4 06/25/2020   CL 103 06/25/2020   CO2 21 06/25/2020   Lab Results  Component Value Date   ALT 13 06/25/2020   AST 19 06/25/2020   ALKPHOS 47 06/25/2020   BILITOT 0.5 06/25/2020  Lab Results  Component Value Date   HGBA1C 4.5 (L) 06/25/2020   HGBA1C 4.5 (L) 12/17/2019   HGBA1C 4.8 05/02/2019   HGBA1C 4.7 04/11/2014   HGBA1C 5.1 05/02/2013   Lab Results  Component Value Date   INSULIN 5.7 06/25/2020   INSULIN 12.8 12/17/2019   INSULIN 14.6 08/13/2019   INSULIN 19.9 05/02/2019   Lab Results  Component Value Date   TSH 1.930 05/02/2019   Lab Results  Component Value Date   CHOL 162 06/25/2020   HDL 58 06/25/2020   LDLCALC 88  06/25/2020   TRIG 86 06/25/2020   Lab Results  Component Value Date   WBC 6.0 05/02/2019   HGB 14.0 05/02/2019   HCT 42.1 05/02/2019   MCV 100 (H) 05/02/2019   PLT 262 05/02/2019   Lab Results  Component Value Date   IRON 101 05/02/2019   TIBC 333 05/02/2019   FERRITIN 71 05/02/2019   Attestation Statements:   Reviewed by clinician on day of visit: allergies, medications, problem list, medical history, surgical history, family history, social history, and previous encounter notes.  Leodis Binet Friedenbach, CMA, am acting as Location manager for [employer], [place].  I have reviewed the above documentation for accuracy and completeness, and I agree with the above. -  Georgianne Fick, FNP

## 2020-08-10 ENCOUNTER — Encounter (INDEPENDENT_AMBULATORY_CARE_PROVIDER_SITE_OTHER): Payer: Self-pay | Admitting: Family Medicine

## 2020-08-10 ENCOUNTER — Ambulatory Visit (INDEPENDENT_AMBULATORY_CARE_PROVIDER_SITE_OTHER): Payer: Commercial Managed Care - PPO | Admitting: Family Medicine

## 2020-08-10 ENCOUNTER — Other Ambulatory Visit: Payer: Self-pay

## 2020-08-10 VITALS — BP 124/86 | HR 90 | Temp 98.2°F | Ht 60.0 in | Wt 206.0 lb

## 2020-08-10 DIAGNOSIS — Z6841 Body Mass Index (BMI) 40.0 and over, adult: Secondary | ICD-10-CM

## 2020-08-10 DIAGNOSIS — E8881 Metabolic syndrome: Secondary | ICD-10-CM | POA: Diagnosis not present

## 2020-08-10 DIAGNOSIS — F3289 Other specified depressive episodes: Secondary | ICD-10-CM | POA: Diagnosis not present

## 2020-08-10 DIAGNOSIS — Z9189 Other specified personal risk factors, not elsewhere classified: Secondary | ICD-10-CM

## 2020-08-10 DIAGNOSIS — E88819 Insulin resistance, unspecified: Secondary | ICD-10-CM

## 2020-08-10 MED ORDER — BUPROPION HCL ER (SR) 150 MG PO TB12: 150.0000 mg | ORAL_TABLET | Freq: Every day | ORAL | 1 refills | Status: AC

## 2020-08-10 MED ORDER — TRULICITY 3 MG/0.5ML ~~LOC~~ SOAJ: 3.0000 mg | SUBCUTANEOUS | 0 refills | Status: AC

## 2020-08-10 MED ORDER — METFORMIN HCL 500 MG PO TABS: 500.0000 mg | ORAL_TABLET | Freq: Every day | ORAL | 1 refills | Status: AC

## 2020-08-11 ENCOUNTER — Encounter (INDEPENDENT_AMBULATORY_CARE_PROVIDER_SITE_OTHER): Payer: Self-pay | Admitting: Family Medicine

## 2020-08-11 NOTE — Progress Notes (Signed)
Chief Complaint:   OBESITY [name] [name] is here to discuss her progress with her obesity treatment plan along with follow-up of her obesity related diagnoses. [name] [name] is on the Category 1 Plan and states she is following her eating plan approximately 90% of the time. [name] [name] states she is walking for 30-60 minutes 5 times per week.  Today's visit was #: 22 Starting weight: 232 lbs Starting date: 05/02/2019 Today's weight: 206 lbs Today's date: 08/10/2020 Total lbs lost to date: 26 Total lbs lost since last in-office visit: 3  Interim History: [name] [name] is doing very well on the meal plan. She is moving to [place] this week to start a new job.  Subjective:   1. Insulin resistance [name] [name]'s hunger is well controlled on metformin and Trulicity.  Lab Results  Component Value Date   INSULIN 5.7 06/25/2020   INSULIN 12.8 12/17/2019   INSULIN 14.6 08/13/2019   INSULIN 19.9 05/02/2019   Lab Results  Component Value Date   HGBA1C 4.5 (L) 06/25/2020   2. Other depression with emotional eating [name] [name]'s mood is stable on bupropion. She notes her cravings are well controlled.  3. At risk for side effect of medication [name] [name] is at risk of drug side effects due to increased dose of Trulicity and possible over replacement of Vit D if level not checked.  Assessment/Plan:   1. Insulin resistance  [name] [name] agreed to increase 3.0 mg weekly (90 day RX)  with no refills, and we will refill metformin for 90 days with 1 refill.  I encouraged her to establish care with a PCP in [facility] ASAP.  - Dulaglutide (TRULICITY) 3 RC/7.8LF SOPN; Inject 3 mg as directed once a week.  Dispense: 6 mL; Refill: 0 - metFORMIN (GLUCOPHAGE) 500 MG tablet; Take 1 tablet (500 mg total) by mouth daily.  Dispense: 90 tablet; Refill: 1  2. Other depression with emotional eating . We will refill bupropion for 90 days with 1 refill.  - buPROPion (WELLBUTRIN SR) 150 MG 12 hr tablet; Take 1 tablet (150 mg total)  by mouth daily with breakfast.  Dispense: 90 tablet; Refill: 1  3. At risk for side effect of medication [name] [name] was given approximately 15 minutes of drug side effect counseling today. We discussed that she will need her Vit D level checked to prevent over-replacement. We discussed side effect possibility and risk versus benefits.  Repetitive spaced learning was employed today to elicit superior memory formation and behavioral change.  4. Obesity, current BMI 40.23 [name] [name] is currently in the action stage of change. As such, her goal is to continue with weight loss efforts. She has agreed to the Category 1 Plan.   [name] [name] is to get establish with a primary care provider in [place] to continue all her medications.  Exercise goals: As is.  Behavioral modification strategies: increasing lean protein intake, decreasing simple carbohydrates and planning for success.  Kenia has agreed to follow-up with our clinic as needed.   Objective:   Blood pressure 124/86, pulse 90, temperature 98.2 F (36.8 C), temperature source Oral, height 5' (1.524 m), weight 206 lb (93.4 kg), SpO2 97 %. Body mass index is 40.23 kg/m.  General: Cooperative, alert, well developed, in no acute distress. HEENT: Conjunctivae and lids unremarkable. Cardiovascular: Regular rhythm.  Lungs: Normal work of breathing. Neurologic: No focal deficits.   Lab Results  Component Value Date   CREATININE 0.93 06/25/2020   BUN 10 06/25/2020   NA 139 06/25/2020   K 4.4 06/25/2020  CL 103 06/25/2020   CO2 21 06/25/2020   Lab Results  Component Value Date   ALT 13 06/25/2020   AST 19 06/25/2020   ALKPHOS 47 06/25/2020   BILITOT 0.5 06/25/2020   Lab Results  Component Value Date   HGBA1C 4.5 (L) 06/25/2020   HGBA1C 4.5 (L) 12/17/2019   HGBA1C 4.8 05/02/2019   HGBA1C 4.7 04/11/2014   HGBA1C 5.1 05/02/2013   Lab Results  Component Value Date   INSULIN 5.7 06/25/2020   INSULIN 12.8 12/17/2019   INSULIN  14.6 08/13/2019   INSULIN 19.9 05/02/2019   Lab Results  Component Value Date   TSH 1.930 05/02/2019   Lab Results  Component Value Date   CHOL 162 06/25/2020   HDL 58 06/25/2020   LDLCALC 88 06/25/2020   TRIG 86 06/25/2020   Lab Results  Component Value Date   WBC 6.0 05/02/2019   HGB 14.0 05/02/2019   HCT 42.1 05/02/2019   MCV 100 (H) 05/02/2019   PLT 262 05/02/2019   Lab Results  Component Value Date   IRON 101 05/02/2019   TIBC 333 05/02/2019   FERRITIN 71 05/02/2019   Attestation Statements:   Reviewed by clinician on day of visit: allergies, medications, problem list, medical history, surgical history, family history, social history, and previous encounter notes.   Wilhemena Durie, am acting as Location manager for [employer], FNP-C.  I have reviewed the above documentation for accuracy and completeness, and I agree with the above. -  Georgianne Fick, FNP

## 2020-08-19 ENCOUNTER — Other Ambulatory Visit (INDEPENDENT_AMBULATORY_CARE_PROVIDER_SITE_OTHER): Payer: Self-pay | Admitting: Family Medicine

## 2020-08-19 DIAGNOSIS — E8881 Metabolic syndrome: Secondary | ICD-10-CM

## 2020-08-19 NOTE — Telephone Encounter (Signed)
Pt last seen by Dawn Whitmire, FNP.  

## 2020-09-07 ENCOUNTER — Ambulatory Visit: Payer: Self-pay

## 2020-10-17 ENCOUNTER — Other Ambulatory Visit: Payer: Self-pay | Admitting: Obstetrics and Gynecology

## 2020-10-19 NOTE — Telephone Encounter (Signed)
Annual exam scheduled on 12/14/20

## 2020-10-25 ENCOUNTER — Other Ambulatory Visit: Payer: Self-pay | Admitting: Obstetrics and Gynecology

## 2020-10-25 ENCOUNTER — Other Ambulatory Visit (INDEPENDENT_AMBULATORY_CARE_PROVIDER_SITE_OTHER): Payer: Self-pay | Admitting: Family Medicine

## 2020-10-25 DIAGNOSIS — E8881 Metabolic syndrome: Secondary | ICD-10-CM

## 2020-10-26 NOTE — Telephone Encounter (Signed)
Last OV with Dawn 

## 2020-12-14 ENCOUNTER — Ambulatory Visit: Payer: 59 | Admitting: Obstetrics and Gynecology

## 2020-12-14 ENCOUNTER — Ambulatory Visit: Payer: Commercial Managed Care - PPO | Admitting: Obstetrics and Gynecology

## 2021-01-03 ENCOUNTER — Other Ambulatory Visit: Payer: Self-pay | Admitting: Obstetrics and Gynecology

## 2021-02-24 ENCOUNTER — Other Ambulatory Visit (INDEPENDENT_AMBULATORY_CARE_PROVIDER_SITE_OTHER): Payer: Self-pay | Admitting: Family Medicine

## 2021-02-24 DIAGNOSIS — F3289 Other specified depressive episodes: Secondary | ICD-10-CM

## 2021-11-10 ENCOUNTER — Encounter (INDEPENDENT_AMBULATORY_CARE_PROVIDER_SITE_OTHER): Payer: Self-pay
